# Patient Record
Sex: Female | Born: 1940 | Race: White | Hispanic: No | Marital: Single | Smoking: Former smoker
Health system: Southern US, Community
[De-identification: ages and names within clinical notes are randomized; demographics above are authoritative.]

## PROBLEM LIST (undated history)

## (undated) DIAGNOSIS — Z87442 Personal history of urinary calculi: Secondary | ICD-10-CM

## (undated) DIAGNOSIS — I639 Cerebral infarction, unspecified: Secondary | ICD-10-CM

## (undated) DIAGNOSIS — R011 Cardiac murmur, unspecified: Secondary | ICD-10-CM

## (undated) DIAGNOSIS — C801 Malignant (primary) neoplasm, unspecified: Secondary | ICD-10-CM

## (undated) DIAGNOSIS — I10 Essential (primary) hypertension: Secondary | ICD-10-CM

## (undated) DIAGNOSIS — M1990 Unspecified osteoarthritis, unspecified site: Secondary | ICD-10-CM

## (undated) DIAGNOSIS — R001 Bradycardia, unspecified: Secondary | ICD-10-CM

## (undated) DIAGNOSIS — G4730 Sleep apnea, unspecified: Secondary | ICD-10-CM

## (undated) DIAGNOSIS — I4891 Unspecified atrial fibrillation: Secondary | ICD-10-CM

## (undated) DIAGNOSIS — R7303 Prediabetes: Secondary | ICD-10-CM

## (undated) DIAGNOSIS — I480 Paroxysmal atrial fibrillation: Secondary | ICD-10-CM

## (undated) HISTORY — PX: TRIGGER FINGER RELEASE: SHX641

## (undated) HISTORY — PX: CATARACT EXTRACTION, BILATERAL: SHX1313

## (undated) HISTORY — DX: Bradycardia, unspecified: R00.1

## (undated) HISTORY — PX: TUMOR REMOVAL: SHX12

## (undated) NOTE — Telephone Encounter (Signed)
 Formatting of this note might be different from the original. Received secured message thrhello, pt gets a new order every 3 months for her replenishment supplies for the bi-pap machine.  She was just seen with Dr. Wonda on Mon., June 13, 2024.  [facility] has her records from [place]  regarding the original sleep apnea test completed in [facility] by the Cardiac Sleep Apnea provider.  She is asking about getting an order from Dr. Wonda for her supplies.  Is there someone to assist?  She's in the waiting area.ough epic:   Routing bipap supply order request to provide Electronically signed by Drenda Jansky at 06/15/2024 12:43 PM CDT

## (undated) NOTE — Telephone Encounter (Signed)
 Formatting of this note might be different from the original. Pt returns call and given update from PCP. Pt states understanding.   Nothing further needed at this time.   Electronically signed by Elodia Herter, RN at 05/18/2024  1:43 PM CDT

## (undated) NOTE — Telephone Encounter (Signed)
 Formatting of this note is different from the original. Wonda Francis BRAVO, DO to Me (Selected Message) [facility]   05/18/24  9:18 AM Note Patient can be told that her labs are all normal and that we are still waiting for the ANA cascade.    Message left for patient to return my call.  Electronically signed by Bonney Pierce, RN at 05/18/2024 12:00 PM CDT

## (undated) NOTE — Telephone Encounter (Signed)
 Formatting of this note might be different from the original. Mychart to patient with number and order status Electronically signed by Bonney Pierce, RN at 06/15/2024  1:46 PM CDT

## (undated) NOTE — Progress Notes (Signed)
 Formatting of this note might be different from the original. Patient can be told that her labs are all normal and that we are still waiting for the ANA cascade. Electronically signed by Wonda Francis BRAVO, DO at 05/18/2024  9:18 AM CDT

## (undated) NOTE — Progress Notes (Signed)
 Formatting of this note might be different from the original. Patient ANA cascade is negative.  Please call Electronically signed by Wonda Francis BRAVO, DO at 05/20/2024  8:26 AM CDT

---

## 2015-11-26 DIAGNOSIS — M25561 Pain in right knee: Secondary | ICD-10-CM | POA: Diagnosis not present

## 2015-11-26 DIAGNOSIS — S134XXD Sprain of ligaments of cervical spine, subsequent encounter: Secondary | ICD-10-CM | POA: Diagnosis not present

## 2015-11-26 DIAGNOSIS — M542 Cervicalgia: Secondary | ICD-10-CM | POA: Diagnosis not present

## 2015-11-26 DIAGNOSIS — M5030 Other cervical disc degeneration, unspecified cervical region: Secondary | ICD-10-CM | POA: Diagnosis not present

## 2015-11-28 DIAGNOSIS — M25561 Pain in right knee: Secondary | ICD-10-CM | POA: Diagnosis not present

## 2015-11-28 DIAGNOSIS — M542 Cervicalgia: Secondary | ICD-10-CM | POA: Diagnosis not present

## 2015-11-28 DIAGNOSIS — S134XXD Sprain of ligaments of cervical spine, subsequent encounter: Secondary | ICD-10-CM | POA: Diagnosis not present

## 2015-11-28 DIAGNOSIS — M5030 Other cervical disc degeneration, unspecified cervical region: Secondary | ICD-10-CM | POA: Diagnosis not present

## 2015-12-17 DIAGNOSIS — M5030 Other cervical disc degeneration, unspecified cervical region: Secondary | ICD-10-CM | POA: Diagnosis not present

## 2015-12-17 DIAGNOSIS — S134XXD Sprain of ligaments of cervical spine, subsequent encounter: Secondary | ICD-10-CM | POA: Diagnosis not present

## 2015-12-17 DIAGNOSIS — M542 Cervicalgia: Secondary | ICD-10-CM | POA: Diagnosis not present

## 2015-12-17 DIAGNOSIS — M25561 Pain in right knee: Secondary | ICD-10-CM | POA: Diagnosis not present

## 2015-12-18 DIAGNOSIS — R05 Cough: Secondary | ICD-10-CM | POA: Diagnosis not present

## 2015-12-18 DIAGNOSIS — J300 Vasomotor rhinitis: Secondary | ICD-10-CM | POA: Diagnosis not present

## 2015-12-24 DIAGNOSIS — M5030 Other cervical disc degeneration, unspecified cervical region: Secondary | ICD-10-CM | POA: Diagnosis not present

## 2015-12-24 DIAGNOSIS — S134XXD Sprain of ligaments of cervical spine, subsequent encounter: Secondary | ICD-10-CM | POA: Diagnosis not present

## 2015-12-24 DIAGNOSIS — M542 Cervicalgia: Secondary | ICD-10-CM | POA: Diagnosis not present

## 2015-12-24 DIAGNOSIS — M25561 Pain in right knee: Secondary | ICD-10-CM | POA: Diagnosis not present

## 2015-12-26 DIAGNOSIS — H25813 Combined forms of age-related cataract, bilateral: Secondary | ICD-10-CM | POA: Diagnosis not present

## 2015-12-28 DIAGNOSIS — M542 Cervicalgia: Secondary | ICD-10-CM | POA: Diagnosis not present

## 2015-12-28 DIAGNOSIS — M25561 Pain in right knee: Secondary | ICD-10-CM | POA: Diagnosis not present

## 2015-12-28 DIAGNOSIS — M5030 Other cervical disc degeneration, unspecified cervical region: Secondary | ICD-10-CM | POA: Diagnosis not present

## 2015-12-28 DIAGNOSIS — S134XXD Sprain of ligaments of cervical spine, subsequent encounter: Secondary | ICD-10-CM | POA: Diagnosis not present

## 2015-12-31 DIAGNOSIS — M25561 Pain in right knee: Secondary | ICD-10-CM | POA: Diagnosis not present

## 2015-12-31 DIAGNOSIS — M5030 Other cervical disc degeneration, unspecified cervical region: Secondary | ICD-10-CM | POA: Diagnosis not present

## 2015-12-31 DIAGNOSIS — M542 Cervicalgia: Secondary | ICD-10-CM | POA: Diagnosis not present

## 2015-12-31 DIAGNOSIS — S134XXD Sprain of ligaments of cervical spine, subsequent encounter: Secondary | ICD-10-CM | POA: Diagnosis not present

## 2016-01-02 DIAGNOSIS — M5030 Other cervical disc degeneration, unspecified cervical region: Secondary | ICD-10-CM | POA: Diagnosis not present

## 2016-01-02 DIAGNOSIS — M25561 Pain in right knee: Secondary | ICD-10-CM | POA: Diagnosis not present

## 2016-01-02 DIAGNOSIS — M542 Cervicalgia: Secondary | ICD-10-CM | POA: Diagnosis not present

## 2016-01-02 DIAGNOSIS — S134XXD Sprain of ligaments of cervical spine, subsequent encounter: Secondary | ICD-10-CM | POA: Diagnosis not present

## 2016-01-04 DIAGNOSIS — M25561 Pain in right knee: Secondary | ICD-10-CM | POA: Diagnosis not present

## 2016-01-04 DIAGNOSIS — M5030 Other cervical disc degeneration, unspecified cervical region: Secondary | ICD-10-CM | POA: Diagnosis not present

## 2016-01-04 DIAGNOSIS — S134XXD Sprain of ligaments of cervical spine, subsequent encounter: Secondary | ICD-10-CM | POA: Diagnosis not present

## 2016-01-04 DIAGNOSIS — M542 Cervicalgia: Secondary | ICD-10-CM | POA: Diagnosis not present

## 2016-02-04 DIAGNOSIS — M5030 Other cervical disc degeneration, unspecified cervical region: Secondary | ICD-10-CM | POA: Diagnosis not present

## 2016-02-04 DIAGNOSIS — M25561 Pain in right knee: Secondary | ICD-10-CM | POA: Diagnosis not present

## 2016-02-04 DIAGNOSIS — S134XXD Sprain of ligaments of cervical spine, subsequent encounter: Secondary | ICD-10-CM | POA: Diagnosis not present

## 2016-02-04 DIAGNOSIS — M542 Cervicalgia: Secondary | ICD-10-CM | POA: Diagnosis not present

## 2016-02-07 DIAGNOSIS — E041 Nontoxic single thyroid nodule: Secondary | ICD-10-CM | POA: Diagnosis not present

## 2016-02-07 DIAGNOSIS — I493 Ventricular premature depolarization: Secondary | ICD-10-CM | POA: Diagnosis not present

## 2016-02-07 DIAGNOSIS — R030 Elevated blood-pressure reading, without diagnosis of hypertension: Secondary | ICD-10-CM | POA: Diagnosis not present

## 2016-02-07 DIAGNOSIS — E038 Other specified hypothyroidism: Secondary | ICD-10-CM | POA: Diagnosis not present

## 2016-03-03 DIAGNOSIS — S90112A Contusion of left great toe without damage to nail, initial encounter: Secondary | ICD-10-CM | POA: Diagnosis not present

## 2016-03-03 DIAGNOSIS — S90111A Contusion of right great toe without damage to nail, initial encounter: Secondary | ICD-10-CM | POA: Diagnosis not present

## 2016-03-03 DIAGNOSIS — M79671 Pain in right foot: Secondary | ICD-10-CM | POA: Diagnosis not present

## 2016-03-03 DIAGNOSIS — R262 Difficulty in walking, not elsewhere classified: Secondary | ICD-10-CM | POA: Diagnosis not present

## 2016-03-03 DIAGNOSIS — L84 Corns and callosities: Secondary | ICD-10-CM | POA: Diagnosis not present

## 2016-03-03 DIAGNOSIS — M79672 Pain in left foot: Secondary | ICD-10-CM | POA: Diagnosis not present

## 2016-03-24 DIAGNOSIS — L820 Inflamed seborrheic keratosis: Secondary | ICD-10-CM | POA: Diagnosis not present

## 2016-05-15 DIAGNOSIS — I493 Ventricular premature depolarization: Secondary | ICD-10-CM | POA: Diagnosis not present

## 2016-05-15 DIAGNOSIS — E041 Nontoxic single thyroid nodule: Secondary | ICD-10-CM | POA: Diagnosis not present

## 2016-05-15 DIAGNOSIS — R030 Elevated blood-pressure reading, without diagnosis of hypertension: Secondary | ICD-10-CM | POA: Diagnosis not present

## 2016-05-15 DIAGNOSIS — E038 Other specified hypothyroidism: Secondary | ICD-10-CM | POA: Diagnosis not present

## 2016-05-30 DIAGNOSIS — N200 Calculus of kidney: Secondary | ICD-10-CM | POA: Diagnosis not present

## 2016-05-30 DIAGNOSIS — E042 Nontoxic multinodular goiter: Secondary | ICD-10-CM | POA: Diagnosis not present

## 2016-05-30 DIAGNOSIS — E559 Vitamin D deficiency, unspecified: Secondary | ICD-10-CM | POA: Diagnosis not present

## 2016-05-30 DIAGNOSIS — R946 Abnormal results of thyroid function studies: Secondary | ICD-10-CM | POA: Diagnosis not present

## 2016-06-16 DIAGNOSIS — Z8249 Family history of ischemic heart disease and other diseases of the circulatory system: Secondary | ICD-10-CM | POA: Diagnosis not present

## 2016-06-16 DIAGNOSIS — I872 Venous insufficiency (chronic) (peripheral): Secondary | ICD-10-CM | POA: Diagnosis not present

## 2016-06-16 DIAGNOSIS — I341 Nonrheumatic mitral (valve) prolapse: Secondary | ICD-10-CM | POA: Diagnosis not present

## 2016-06-16 DIAGNOSIS — I708 Atherosclerosis of other arteries: Secondary | ICD-10-CM | POA: Diagnosis not present

## 2016-06-16 DIAGNOSIS — I70219 Atherosclerosis of native arteries of extremities with intermittent claudication, unspecified extremity: Secondary | ICD-10-CM | POA: Diagnosis not present

## 2016-06-16 DIAGNOSIS — R001 Bradycardia, unspecified: Secondary | ICD-10-CM | POA: Diagnosis not present

## 2016-09-10 DIAGNOSIS — R001 Bradycardia, unspecified: Secondary | ICD-10-CM | POA: Diagnosis not present

## 2016-09-10 DIAGNOSIS — I340 Nonrheumatic mitral (valve) insufficiency: Secondary | ICD-10-CM | POA: Diagnosis not present

## 2016-09-11 DIAGNOSIS — Z23 Encounter for immunization: Secondary | ICD-10-CM | POA: Diagnosis not present

## 2016-12-03 DIAGNOSIS — H25013 Cortical age-related cataract, bilateral: Secondary | ICD-10-CM | POA: Diagnosis not present

## 2016-12-03 DIAGNOSIS — H2513 Age-related nuclear cataract, bilateral: Secondary | ICD-10-CM | POA: Diagnosis not present

## 2016-12-04 DIAGNOSIS — E041 Nontoxic single thyroid nodule: Secondary | ICD-10-CM | POA: Diagnosis not present

## 2016-12-04 DIAGNOSIS — M8588 Other specified disorders of bone density and structure, other site: Secondary | ICD-10-CM | POA: Diagnosis not present

## 2017-01-21 DIAGNOSIS — H2513 Age-related nuclear cataract, bilateral: Secondary | ICD-10-CM | POA: Diagnosis not present

## 2017-01-21 DIAGNOSIS — H02839 Dermatochalasis of unspecified eye, unspecified eyelid: Secondary | ICD-10-CM | POA: Diagnosis not present

## 2017-01-21 DIAGNOSIS — H2511 Age-related nuclear cataract, right eye: Secondary | ICD-10-CM | POA: Diagnosis not present

## 2017-01-21 DIAGNOSIS — H18413 Arcus senilis, bilateral: Secondary | ICD-10-CM | POA: Diagnosis not present

## 2017-01-21 DIAGNOSIS — H25043 Posterior subcapsular polar age-related cataract, bilateral: Secondary | ICD-10-CM | POA: Diagnosis not present

## 2017-03-03 DIAGNOSIS — H2511 Age-related nuclear cataract, right eye: Secondary | ICD-10-CM | POA: Diagnosis not present

## 2017-03-03 DIAGNOSIS — H25011 Cortical age-related cataract, right eye: Secondary | ICD-10-CM | POA: Diagnosis not present

## 2017-03-10 DIAGNOSIS — H25011 Cortical age-related cataract, right eye: Secondary | ICD-10-CM | POA: Diagnosis not present

## 2017-03-10 DIAGNOSIS — H2511 Age-related nuclear cataract, right eye: Secondary | ICD-10-CM | POA: Diagnosis not present

## 2017-03-20 DIAGNOSIS — Z9841 Cataract extraction status, right eye: Secondary | ICD-10-CM | POA: Diagnosis not present

## 2017-03-31 DIAGNOSIS — H2512 Age-related nuclear cataract, left eye: Secondary | ICD-10-CM | POA: Diagnosis not present

## 2017-03-31 DIAGNOSIS — H25012 Cortical age-related cataract, left eye: Secondary | ICD-10-CM | POA: Diagnosis not present

## 2017-04-07 DIAGNOSIS — H25012 Cortical age-related cataract, left eye: Secondary | ICD-10-CM | POA: Diagnosis not present

## 2017-04-07 DIAGNOSIS — H2512 Age-related nuclear cataract, left eye: Secondary | ICD-10-CM | POA: Diagnosis not present

## 2017-06-17 ENCOUNTER — Other Ambulatory Visit: Payer: Self-pay | Admitting: Internal Medicine

## 2017-06-17 DIAGNOSIS — M859 Disorder of bone density and structure, unspecified: Secondary | ICD-10-CM | POA: Diagnosis not present

## 2017-06-17 DIAGNOSIS — R3121 Asymptomatic microscopic hematuria: Secondary | ICD-10-CM | POA: Diagnosis not present

## 2017-06-17 DIAGNOSIS — R001 Bradycardia, unspecified: Secondary | ICD-10-CM | POA: Diagnosis not present

## 2017-06-17 DIAGNOSIS — I493 Ventricular premature depolarization: Secondary | ICD-10-CM | POA: Diagnosis not present

## 2017-06-17 DIAGNOSIS — M4302 Spondylolysis, cervical region: Secondary | ICD-10-CM | POA: Diagnosis not present

## 2017-06-17 DIAGNOSIS — Z1389 Encounter for screening for other disorder: Secondary | ICD-10-CM | POA: Diagnosis not present

## 2017-06-17 DIAGNOSIS — E041 Nontoxic single thyroid nodule: Secondary | ICD-10-CM

## 2017-06-17 DIAGNOSIS — Z6826 Body mass index (BMI) 26.0-26.9, adult: Secondary | ICD-10-CM | POA: Diagnosis not present

## 2017-06-17 DIAGNOSIS — J45998 Other asthma: Secondary | ICD-10-CM | POA: Diagnosis not present

## 2017-06-17 DIAGNOSIS — Z0000 Encounter for general adult medical examination without abnormal findings: Secondary | ICD-10-CM | POA: Diagnosis not present

## 2017-06-17 DIAGNOSIS — Z1231 Encounter for screening mammogram for malignant neoplasm of breast: Secondary | ICD-10-CM | POA: Diagnosis not present

## 2017-06-17 DIAGNOSIS — J3089 Other allergic rhinitis: Secondary | ICD-10-CM | POA: Diagnosis not present

## 2017-06-22 ENCOUNTER — Ambulatory Visit
Admission: RE | Admit: 2017-06-22 | Discharge: 2017-06-22 | Disposition: A | Discharge status: Home / Self Care | Payer: Medicare Other | Source: Ambulatory Visit | Attending: Internal Medicine | Admitting: Internal Medicine

## 2017-06-22 DIAGNOSIS — E041 Nontoxic single thyroid nodule: Secondary | ICD-10-CM

## 2017-06-22 DIAGNOSIS — E042 Nontoxic multinodular goiter: Secondary | ICD-10-CM | POA: Diagnosis not present

## 2017-06-24 DIAGNOSIS — Z6827 Body mass index (BMI) 27.0-27.9, adult: Secondary | ICD-10-CM | POA: Diagnosis not present

## 2017-06-24 DIAGNOSIS — B029 Zoster without complications: Secondary | ICD-10-CM | POA: Diagnosis not present

## 2017-07-28 ENCOUNTER — Other Ambulatory Visit: Payer: Self-pay | Admitting: Internal Medicine

## 2017-07-28 DIAGNOSIS — Z1231 Encounter for screening mammogram for malignant neoplasm of breast: Secondary | ICD-10-CM

## 2017-08-13 DIAGNOSIS — L821 Other seborrheic keratosis: Secondary | ICD-10-CM | POA: Diagnosis not present

## 2017-08-13 DIAGNOSIS — L720 Epidermal cyst: Secondary | ICD-10-CM | POA: Diagnosis not present

## 2017-08-13 DIAGNOSIS — L570 Actinic keratosis: Secondary | ICD-10-CM | POA: Diagnosis not present

## 2017-08-14 DIAGNOSIS — Z23 Encounter for immunization: Secondary | ICD-10-CM | POA: Diagnosis not present

## 2017-08-18 DIAGNOSIS — M859 Disorder of bone density and structure, unspecified: Secondary | ICD-10-CM | POA: Diagnosis not present

## 2017-08-21 ENCOUNTER — Encounter: Payer: Self-pay | Admitting: Radiology

## 2017-08-21 ENCOUNTER — Ambulatory Visit
Admission: RE | Admit: 2017-08-21 | Discharge: 2017-08-21 | Disposition: A | Discharge status: Home / Self Care | Payer: Medicare Other | Source: Ambulatory Visit | Attending: Internal Medicine | Admitting: Internal Medicine

## 2017-08-21 DIAGNOSIS — Z1231 Encounter for screening mammogram for malignant neoplasm of breast: Secondary | ICD-10-CM | POA: Diagnosis not present

## 2017-09-17 ENCOUNTER — Encounter: Payer: Self-pay | Admitting: Internal Medicine

## 2017-09-17 ENCOUNTER — Ambulatory Visit (INDEPENDENT_AMBULATORY_CARE_PROVIDER_SITE_OTHER): Payer: Medicare Other | Admitting: Internal Medicine

## 2017-09-17 VITALS — BP 124/72 | HR 70 | Temp 98.0°F | Ht 66.0 in | Wt 167.0 lb

## 2017-09-17 DIAGNOSIS — E042 Nontoxic multinodular goiter: Secondary | ICD-10-CM | POA: Diagnosis not present

## 2017-09-17 DIAGNOSIS — R5383 Other fatigue: Secondary | ICD-10-CM | POA: Diagnosis not present

## 2017-09-17 LAB — T4, FREE: Free T4: 1.06 ng/dL (ref 0.60–1.60)

## 2017-09-17 LAB — TSH: TSH: 0.53 u[IU]/mL (ref 0.35–4.50)

## 2017-09-17 LAB — T3, FREE: T3 FREE: 3.4 pg/mL (ref 2.3–4.2)

## 2017-09-17 NOTE — Progress Notes (Signed)
Patient ID: Kristin Lowe, female   DOB: 1941/04/06, 76 y.o.   MRN: [id number]    HPI  Kristin Lowe is a 76 y.o.-year-old female, referred by her PCP, Dr. Joylene Draft, for evaluation for multiple thyroid nodules. However, she also mentions has increased fatigue. She moved here in 2017 from [place].  She was found to have thyroid nodules in 2007 - followed by endocrinology in [facility].  She was told her nodules are now shrinking.  She had several Bx's >> benign, last in 2016  Thyroid U/S (06/22/2017): 3 small nodules, isoechoic, solid. No follow-up was recommended.  Pt denies: - feeling nodules in neck - dysphagia - choking - SOB with lying down  I reviewed pt's thyroid tests: 06/17/2017: TSH 0.57 09/04/2015: TSH 0.34 04/12/2014: TSH 0.68 No results found for: TSH, FREET4   Pt c/o: - + fatigue - + hair loss - + insomnia - wakes up at 3 am - + heat intolerance - no tremors - no palpitations - + stiff muscles - + anxiety/+ depression -dog died - no hyperdefecation/+ constipation - + weight gain - + irritability She is very active and also exercising: biking.   No FH of thyroid ds.: mother, sister. No FH of thyroid cancer. No h/o radiation tx to head or neck.  No seaweed or kelp. No recent contrast studies. No steroid use. No herbal supplements. No Biotin supplements or Hair, Skin and Nails vitamins.  She is on vitamin D - recently  increased to 2000 units a day (increased 3 weeks). She also was told to increase calcium to 500 mg 2x day.We reviewed together her previous labs from PCP, latest from 06/17/2017, when her vitamin D was 62.8 and a calcium level was slightly high, at 10.6 (7.0-10.5). She was told to increase the vitamin D and calcium doses afterwards as a DEXA scan return on 08/18/2017 indicating osteopenia.  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 Calcium accumulation in arthritic sites can confound the results of  the bone density scan)  - LFN: -1.0  - RFN: -1.9  - FRAX MOF 12%, hip fracture risk 2.7%. These are both under the thresholds for treatment, of 20% and 3% respectively.   We also reviewed the rest of the labs from Dr. Joylene Draft:  06/17/2017:  Normal urinalysis, except 10-20 red blood cells  Lipid panel: 194/143/66/99 Normal CBC with slightly low RDW Normal LFTs Normal CMP except calcium slightly high as mentioned above (patient was taking 500 mg calcium daily at that time)   Of note, on 08/02/2015, the PTH was 63, calcium 9.9, and vitamin D 51 per review of records from previous doctor  ROS: Constitutional: + see HPI, + poor sleep Eyes: + blurry vision, + xerophthalmia ENT: no sore throat, no nodules palpated in throat, no dysphagia/odynophagia, + hoarseness (has allergies) Cardiovascular: no CP/SOB/palpitations/+ leg swelling Respiratory: no cough/SOB Gastrointestinal: no N/V/D/+ C Musculoskeletal:+ muscle/+ joint aches Skin: no rashes, + hair loss Neurological: no tremors/numbness/tingling/dizziness Psychiatric: no depression/+ anxiety  Past Medical History:  Diagnosis Date  . Bradycardia    Past Surgical History:  Procedure Laterality Date  . CATARACT EXTRACTION, BILATERAL    . TRIGGER FINGER RELEASE    . TUMOR REMOVAL     Social History   Social History  . Marital status: single    Spouse name: N/A  . Number of children: 1   Occupational History  . retired   Social History Main Topics  . Smoking  status: Former Research scientist (life sciences)  . Smokeless tobacco: Never Used  . Alcohol use Wine, 4x a week  . Drug use: no   No current outpatient prescriptions on file.   No current facility-administered medications for this visit.    No Known Allergies Family History  Problem Relation Age of Onset  . Breast cancer Mother   . Diabetes Mother   . Thyroid disease Mother   . Heart disease Father   . Hypertension Sister   . Thyroid disease Sister   . Diabetes Brother   .  Hypertension Brother    PE: BP 124/72   Pulse 70   Temp 98 F (36.7 C) (Oral)   Ht 5\' 6"  (1.676 m)   Wt 167 lb (75.8 kg)   SpO2 99%   BMI 26.95 kg/m  Wt Readings from Last 3 Encounters:  09/17/17 167 lb (75.8 kg)   Constitutional: slightly overweight, in NAD Eyes: PERRLA, EOMI, no exophthalmos ENT: moist mucous membranes, no thyromegaly, no cervical lymphadenopathy Cardiovascular: RRR, No MRG Respiratory: CTA B Gastrointestinal: abdomen soft, NT, ND, BS+ Musculoskeletal: no deformities, strength intact in all 4;  Skin: moist, warm, no rashes Neurological: no tremor with outstretched hands, DTR normal in all 4  ASSESSMENT: 1. Multiple thyroid nodules  2. Fatigue  PLAN: 1. Multiple thyroid nodules - I reviewed the images of her  latest thyroid ultrasound and the dominant nodules are not very large  and they do not present concerning features: They are isoechoic, without microcalcification, more wide than tall and well delimited from surrounding tissue. She had extensive imaging for these nodules and also history of biopsies. At this point, I recommended and patient agrees, not to pursue them any further unless she starts developing neck compression symptoms. She never had any neck discomfort from the nodules.   Pt does not have a thyroid cancer family history or a personal history of RxTx to head/neck. All these would favor benignity.   2. Fatigue - Patient has a constellation of general, nonspecific symptoms and we reviewed her latest labs from 05/2017 and also her previous labs from 2016 and they do not show any concerning abnormality. We discussed to still repeat her TFTs since she is complaining of   symptoms that could be attributed to hyper and hypothyroidism, but otherwise, I suggested to continue with follow with Dr. Joylene Draft and I reassured the patient that her symptoms are not likely endocrine related. She may need to have a B12 vitamin checked at follow-up if this has not  been on before. - We discussed about her calcium or vitamin D supplements and she is telling me that the doses were increased after her bone density scan due to the osteopenia diagnosis. However, her vitamin D was well into the normal range at last check in 05/2017 and her calcium was slightly high. I advised her to discuss with her PCP to possibly back off both doses. She has an appointment coming up with him in January. - Regarding her weight gain, I recommended a more plant-based diet and given references - We will schedule another appointment if the TFTs return abnormal   Component     Latest Ref Rng & Units 09/17/2017  TSH     0.35 - 4.50 uIU/mL 0.53  T4,Free(Direct)     0.60 - 1.60 ng/dL 1.06  Triiodothyronine,Free,Serum     2.3 - 4.2 pg/mL 3.4  TFTs are normal.  Philemon Kingdom, MD PhD [facility]

## 2017-09-17 NOTE — Patient Instructions (Addendum)
Please stop at the lab.  Please look up "The Engine 2 diet" by Christa See.  Please let me know if you develop neck symptoms.  Please discuss with Dr Joylene Draft about reducing calcium and vitamin D.   Return to see me as needed.

## 2017-09-18 ENCOUNTER — Telehealth: Payer: Self-pay

## 2017-09-18 NOTE — Telephone Encounter (Signed)
LVM, gave lab results. Gave call back number if any questions or concerns.  

## 2017-09-18 NOTE — Telephone Encounter (Signed)
-----   Message from Philemon Kingdom, MD sent at 09/17/2017  5:35 PM EDT ----- Almyra Free, can you please call pt: TFTs are all normal.

## 2017-12-08 DIAGNOSIS — F4321 Adjustment disorder with depressed mood: Secondary | ICD-10-CM | POA: Diagnosis not present

## 2017-12-09 DIAGNOSIS — M238X2 Other internal derangements of left knee: Secondary | ICD-10-CM | POA: Diagnosis not present

## 2017-12-09 DIAGNOSIS — M25562 Pain in left knee: Secondary | ICD-10-CM | POA: Diagnosis not present

## 2017-12-16 DIAGNOSIS — M25562 Pain in left knee: Secondary | ICD-10-CM | POA: Diagnosis not present

## 2017-12-17 DIAGNOSIS — J45998 Other asthma: Secondary | ICD-10-CM | POA: Diagnosis not present

## 2017-12-18 DIAGNOSIS — S83242A Other tear of medial meniscus, current injury, left knee, initial encounter: Secondary | ICD-10-CM | POA: Diagnosis not present

## 2017-12-18 DIAGNOSIS — M1712 Unilateral primary osteoarthritis, left knee: Secondary | ICD-10-CM | POA: Diagnosis not present

## 2017-12-22 DIAGNOSIS — F4321 Adjustment disorder with depressed mood: Secondary | ICD-10-CM | POA: Diagnosis not present

## 2017-12-22 DIAGNOSIS — H04123 Dry eye syndrome of bilateral lacrimal glands: Secondary | ICD-10-CM | POA: Diagnosis not present

## 2017-12-28 DIAGNOSIS — F4321 Adjustment disorder with depressed mood: Secondary | ICD-10-CM | POA: Diagnosis not present

## 2017-12-30 DIAGNOSIS — M25562 Pain in left knee: Secondary | ICD-10-CM | POA: Diagnosis not present

## 2018-01-04 DIAGNOSIS — M25562 Pain in left knee: Secondary | ICD-10-CM | POA: Diagnosis not present

## 2018-01-07 DIAGNOSIS — M25562 Pain in left knee: Secondary | ICD-10-CM | POA: Diagnosis not present

## 2018-01-11 DIAGNOSIS — F4321 Adjustment disorder with depressed mood: Secondary | ICD-10-CM | POA: Diagnosis not present

## 2018-01-12 DIAGNOSIS — M25562 Pain in left knee: Secondary | ICD-10-CM | POA: Diagnosis not present

## 2018-01-14 DIAGNOSIS — M25562 Pain in left knee: Secondary | ICD-10-CM | POA: Diagnosis not present

## 2018-01-19 DIAGNOSIS — M25562 Pain in left knee: Secondary | ICD-10-CM | POA: Diagnosis not present

## 2018-01-20 DIAGNOSIS — S83242A Other tear of medial meniscus, current injury, left knee, initial encounter: Secondary | ICD-10-CM | POA: Diagnosis not present

## 2018-01-20 DIAGNOSIS — M1712 Unilateral primary osteoarthritis, left knee: Secondary | ICD-10-CM | POA: Diagnosis not present

## 2018-01-21 DIAGNOSIS — M25562 Pain in left knee: Secondary | ICD-10-CM | POA: Diagnosis not present

## 2018-01-26 DIAGNOSIS — M25562 Pain in left knee: Secondary | ICD-10-CM | POA: Diagnosis not present

## 2018-02-01 DIAGNOSIS — F4321 Adjustment disorder with depressed mood: Secondary | ICD-10-CM | POA: Diagnosis not present

## 2018-02-05 DIAGNOSIS — M25562 Pain in left knee: Secondary | ICD-10-CM | POA: Diagnosis not present

## 2018-02-15 DIAGNOSIS — F4321 Adjustment disorder with depressed mood: Secondary | ICD-10-CM | POA: Diagnosis not present

## 2018-02-22 DIAGNOSIS — L820 Inflamed seborrheic keratosis: Secondary | ICD-10-CM | POA: Diagnosis not present

## 2018-02-22 DIAGNOSIS — L821 Other seborrheic keratosis: Secondary | ICD-10-CM | POA: Diagnosis not present

## 2018-03-15 DIAGNOSIS — F4321 Adjustment disorder with depressed mood: Secondary | ICD-10-CM | POA: Diagnosis not present

## 2018-05-11 DIAGNOSIS — Z124 Encounter for screening for malignant neoplasm of cervix: Secondary | ICD-10-CM | POA: Diagnosis not present

## 2018-05-11 DIAGNOSIS — Z1151 Encounter for screening for human papillomavirus (HPV): Secondary | ICD-10-CM | POA: Diagnosis not present

## 2018-05-11 DIAGNOSIS — L292 Pruritus vulvae: Secondary | ICD-10-CM | POA: Diagnosis not present

## 2018-05-11 DIAGNOSIS — Z01419 Encounter for gynecological examination (general) (routine) without abnormal findings: Secondary | ICD-10-CM | POA: Diagnosis not present

## 2018-05-15 ENCOUNTER — Encounter (HOSPITAL_COMMUNITY): Payer: Self-pay | Admitting: Emergency Medicine

## 2018-05-15 ENCOUNTER — Other Ambulatory Visit: Payer: Self-pay

## 2018-05-15 ENCOUNTER — Emergency Department (HOSPITAL_COMMUNITY): Payer: Medicare Other

## 2018-05-15 ENCOUNTER — Observation Stay (HOSPITAL_COMMUNITY)
Admission: EM | Admit: 2018-05-15 | Discharge: 2018-05-17 | Disposition: A | Discharge status: Home / Self Care | Payer: Medicare Other | Attending: Family Medicine | Admitting: Family Medicine

## 2018-05-15 DIAGNOSIS — I10 Essential (primary) hypertension: Secondary | ICD-10-CM

## 2018-05-15 DIAGNOSIS — M6281 Muscle weakness (generalized): Secondary | ICD-10-CM | POA: Insufficient documentation

## 2018-05-15 DIAGNOSIS — R739 Hyperglycemia, unspecified: Secondary | ICD-10-CM

## 2018-05-15 DIAGNOSIS — I639 Cerebral infarction, unspecified: Principal | ICD-10-CM | POA: Insufficient documentation

## 2018-05-15 DIAGNOSIS — G459 Transient cerebral ischemic attack, unspecified: Secondary | ICD-10-CM | POA: Diagnosis not present

## 2018-05-15 DIAGNOSIS — R4781 Slurred speech: Secondary | ICD-10-CM | POA: Diagnosis present

## 2018-05-15 DIAGNOSIS — R51 Headache: Secondary | ICD-10-CM | POA: Diagnosis not present

## 2018-05-15 DIAGNOSIS — Z87891 Personal history of nicotine dependence: Secondary | ICD-10-CM | POA: Insufficient documentation

## 2018-05-15 DIAGNOSIS — R001 Bradycardia, unspecified: Secondary | ICD-10-CM | POA: Diagnosis not present

## 2018-05-15 HISTORY — DX: Essential (primary) hypertension: I10

## 2018-05-15 LAB — DIFFERENTIAL
Abs Immature Granulocytes: 0 10*3/uL (ref 0.0–0.1)
BASOS PCT: 1 %
Basophils Absolute: 0.1 10*3/uL (ref 0.0–0.1)
EOS PCT: 2 %
Eosinophils Absolute: 0.2 10*3/uL (ref 0.0–0.7)
Immature Granulocytes: 0 %
Lymphocytes Relative: 22 %
Lymphs Abs: 1.6 10*3/uL (ref 0.7–4.0)
MONO ABS: 0.4 10*3/uL (ref 0.1–1.0)
Monocytes Relative: 6 %
NEUTROS PCT: 69 %
Neutro Abs: 4.8 10*3/uL (ref 1.7–7.7)

## 2018-05-15 LAB — LIPID PANEL
CHOLESTEROL: 182 mg/dL (ref 0–200)
HDL: 53 mg/dL (ref >40)
LDL CALC: 106 mg/dL — AB (ref 0–99)
TRIGLYCERIDES: 115 mg/dL (ref <150)
Total CHOL/HDL Ratio: 3.4 RATIO
VLDL: 23 mg/dL (ref 0–40)

## 2018-05-15 LAB — I-STAT CHEM 8, ED
BUN: 13 mg/dL (ref 6–20)
CALCIUM ION: 1.3 mmol/L (ref 1.15–1.40)
Chloride: 104 mmol/L (ref 101–111)
Creatinine, Ser: 0.8 mg/dL (ref 0.44–1.00)
Glucose, Bld: 148 mg/dL — ABNORMAL HIGH (ref 65–99)
HCT: 39 % (ref 36.0–46.0)
HEMOGLOBIN: 13.3 g/dL (ref 12.0–15.0)
Potassium: 3.6 mmol/L (ref 3.5–5.1)
SODIUM: 141 mmol/L (ref 135–145)
TCO2: 24 mmol/L (ref 22–32)

## 2018-05-15 LAB — COMPREHENSIVE METABOLIC PANEL
ALK PHOS: 65 U/L (ref 38–126)
ALT: 25 U/L (ref 14–54)
ANION GAP: 6 (ref 5–15)
AST: 28 U/L (ref 15–41)
Albumin: 4.2 g/dL (ref 3.5–5.0)
BUN: 11 mg/dL (ref 6–20)
CALCIUM: 10.3 mg/dL (ref 8.9–10.3)
CHLORIDE: 107 mmol/L (ref 101–111)
CO2: 27 mmol/L (ref 22–32)
Creatinine, Ser: 0.76 mg/dL (ref 0.44–1.00)
GFR calc non Af Amer: >60 mL/min (ref >60)
Glucose, Bld: 154 mg/dL — ABNORMAL HIGH (ref 65–99)
Potassium: 3.7 mmol/L (ref 3.5–5.1)
Sodium: 140 mmol/L (ref 135–145)
Total Bilirubin: 0.4 mg/dL (ref 0.3–1.2)
Total Protein: 7.2 g/dL (ref 6.5–8.1)

## 2018-05-15 LAB — CBC
HCT: 41.8 % (ref 36.0–46.0)
Hemoglobin: 13.1 g/dL (ref 12.0–15.0)
MCH: 29.6 pg (ref 26.0–34.0)
MCHC: 31.3 g/dL (ref 30.0–36.0)
MCV: 94.4 fL (ref 78.0–100.0)
PLATELETS: 276 10*3/uL (ref 150–400)
RBC: 4.43 MIL/uL (ref 3.87–5.11)
RDW: 12.3 % (ref 11.5–15.5)
WBC: 7 10*3/uL (ref 4.0–10.5)

## 2018-05-15 LAB — PROTIME-INR
INR: 1.02
Prothrombin Time: 13.3 seconds (ref 11.4–15.2)

## 2018-05-15 LAB — I-STAT TROPONIN, ED: Troponin i, poc: 0.01 ng/mL (ref 0.00–0.08)

## 2018-05-15 LAB — HEMOGLOBIN A1C
Hgb A1c MFr Bld: 5.5 % (ref 4.8–5.6)
Mean Plasma Glucose: 111.15 mg/dL

## 2018-05-15 LAB — APTT: APTT: 28 s (ref 24–36)

## 2018-05-15 MED ORDER — ENOXAPARIN SODIUM 40 MG/0.4ML ~~LOC~~ SOLN
40.0000 mg | SUBCUTANEOUS | Status: DC
  Administered 2018-05-15 – 2018-05-16 (×2): 40 mg via SUBCUTANEOUS
  Filled 2018-05-15 (×2): qty 0.4

## 2018-05-15 MED ORDER — INSULIN ASPART 100 UNIT/ML ~~LOC~~ SOLN: 0.0000 [IU] | Freq: Three times a day (TID) | SUBCUTANEOUS | Status: DC

## 2018-05-15 MED ORDER — ACETAMINOPHEN 325 MG PO TABS: 650.0000 mg | ORAL_TABLET | Freq: Four times a day (QID) | ORAL | Status: DC | PRN

## 2018-05-15 MED ORDER — ATORVASTATIN CALCIUM 40 MG PO TABS
40.0000 mg | ORAL_TABLET | Freq: Every day | ORAL | Status: DC
  Administered 2018-05-16 – 2018-05-17 (×2): 40 mg via ORAL
  Filled 2018-05-15: qty 2
  Filled 2018-05-15: qty 1
  Filled 2018-05-15 (×2): qty 4
  Filled 2018-05-15 (×2): qty 2

## 2018-05-15 MED ORDER — ASPIRIN EC 81 MG PO TBEC
81.0000 mg | DELAYED_RELEASE_TABLET | Freq: Every day | ORAL | Status: DC
  Administered 2018-05-16: 81 mg via ORAL
  Filled 2018-05-15: qty 1

## 2018-05-15 MED ORDER — ONDANSETRON HCL 4 MG/2ML IJ SOLN: 4.0000 mg | Freq: Four times a day (QID) | INTRAMUSCULAR | Status: DC | PRN

## 2018-05-15 NOTE — ED Notes (Signed)
Pt in CT.

## 2018-05-15 NOTE — ED Triage Notes (Signed)
Pt c/o dull headache above left eye that she noticed when she woke up this morning, states that at noon she was talking to her neighbor and noticed that she was mixing up her words. No neuro deficits at this time, no weakness, A&O x 4.
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
## 2018-05-15 NOTE — ED Provider Notes (Signed)
[place] EMERGENCY DEPARTMENT Provider Note   CSN: [id number] Arrival date & time: 05/15/18  1626     History   Chief Complaint Chief Complaint  Patient presents with  . Headache    HPI Kristin Lowe is a 77 y.o. female.  HPI   Patient is a 77 year old female presenting with intermittent neurologic complaints.  Patient reports that this morning she noticed that she was unable to pronounce her words.  She was talking to neighbors and she was slurring her speech having trouble difficulty speaking.  She did not notice any arm or leg weakness.  She reports by the time that she called her doctor, and resolved.  This was roughly 3 hours later.  She reports she feels back to normal now.  She does not have significant past medical history, She reports, this is her first time being in the hospital since she was born.  Past Medical History:  Diagnosis Date  . Bradycardia   . Hypertension     Patient Active Problem List   Diagnosis Date Noted  . TIA (transient ischemic attack) 05/15/2018  . Multiple thyroid nodules 09/17/2017  . Other fatigue 09/17/2017    Past Surgical History:  Procedure Laterality Date  . CATARACT EXTRACTION, BILATERAL    . TRIGGER FINGER RELEASE    . TUMOR REMOVAL       OB History   None      Home Medications    Prior to Admission medications   Not on File    Family History Family History  Problem Relation Age of Onset  . Breast cancer Mother   . Diabetes Mother   . Thyroid disease Mother   . Heart disease Father   . Hypertension Sister   . Thyroid disease Sister   . Diabetes Brother   . Hypertension Brother     Social History Social History   Tobacco Use  . Smoking status: Former Research scientist (life sciences)  . Smokeless tobacco: Never Used  Substance Use Topics  . Alcohol use: No  . Drug use: Not on file     Allergies   Patient has no known allergies.   Review of Systems Review of Systems  Constitutional: Negative for  activity change and fatigue.  HENT: Negative for congestion and drooling.   Eyes: Negative for discharge.  Respiratory: Negative for cough and chest tightness.   Cardiovascular: Negative for chest pain.  Gastrointestinal: Negative for abdominal distention.  Genitourinary: Negative for difficulty urinating and dysuria.  Musculoskeletal: Negative for joint swelling.  Skin: Negative for rash.  Allergic/Immunologic: Negative for immunocompromised state.  Neurological: Positive for speech difficulty. Negative for dizziness, seizures and weakness.  Psychiatric/Behavioral: Negative for agitation and behavioral problems.  All other systems reviewed and are negative.    Physical Exam Updated Vital Signs BP (!) 151/70   Pulse (!) 58   Temp 98.2 F (36.8 C) (Oral)   Resp 17   SpO2 99%   Physical Exam  Constitutional: She is oriented to person, place, and time. She appears well-developed and well-nourished.  HENT:  Head: Normocephalic and atraumatic.  Eyes: Pupils are equal, round, and reactive to light. EOM are normal. Right eye exhibits no discharge.  Cardiovascular: Normal rate.  Pulmonary/Chest: Effort normal. No respiratory distress.  Abdominal: Soft. Bowel sounds are normal. She exhibits no distension. There is no tenderness.  Neurological: She is oriented to person, place, and time. She has normal strength. She displays a negative Romberg sign. Coordination normal. GCS eye subscore is  4. GCS verbal subscore is 5. GCS motor subscore is 6.  Equal strength bilaterally upper and lower extremities negative pronator drift. Normal sensation bilaterally. Speech comprehensible, no slurring. Facial nerve tested and appears grossly normal. Alert and oriented 3.   Skin: Skin is warm and dry. She is not diaphoretic.  Psychiatric: She has a normal mood and affect.  Nursing note and vitals reviewed.    ED Treatments / Results  Labs (all labs ordered are listed, but only abnormal results are  displayed) Labs Reviewed  COMPREHENSIVE METABOLIC PANEL - Abnormal; Notable for the following components:      Result Value   Glucose, Bld 154 (*)    All other components within normal limits  I-STAT CHEM 8, ED - Abnormal; Notable for the following components:   Glucose, Bld 148 (*)    All other components within normal limits  PROTIME-INR  APTT  CBC  DIFFERENTIAL  LIPID PANEL  HEMOGLOBIN A1C  CBC  BASIC METABOLIC PANEL  I-STAT TROPONIN, ED  CBG MONITORING, ED    EKG None  Radiology Ct Head Wo Contrast  Result Date: 05/15/2018 CLINICAL DATA:  Headache EXAM: CT HEAD WITHOUT CONTRAST TECHNIQUE: Contiguous axial images were obtained from the base of the skull through the vertex without intravenous contrast. COMPARISON:  None. FINDINGS: Brain: No acute territorial infarction, intracranial hemorrhage, or mass lesion is visualized. The ventricles are nonenlarged. Left sub insular hypodensity. Vascular: No hyperdense vessels.  Carotid vascular calcification. Skull: Normal. Negative for fracture or focal lesion. Sinuses/Orbits: Mucosal thickening in the ethmoid and maxillary sinuses. Other: None IMPRESSION: 1. Negative for large vessel territorial infarct, hemorrhage or mass. 2. Small focal hypodensity in the left sub insular region, possible age indeterminate lacunar infarct. Electronically Signed   By: Donavan Foil M.D.   On: 05/15/2018 18:41    Procedures Procedures (including critical care time)  Medications Ordered in ED Medications  aspirin EC tablet 81 mg (has no administration in time range)  insulin aspart (novoLOG) injection 0-9 Units (has no administration in time range)  acetaminophen (TYLENOL) tablet 650 mg (has no administration in time range)  ondansetron (ZOFRAN) injection 4 mg (has no administration in time range)  enoxaparin (LOVENOX) injection 40 mg (has no administration in time range)     Initial Impression / Assessment and Plan / ED Course  I have reviewed  the triage vital signs and the nursing notes.  Pertinent labs & imaging results that were available during my care of the patient were reviewed by me and considered in my medical decision making (see chart for details).      Patient is a 77 year old female presenting with intermittent neurologic complaints.  Patient reports that this morning she noticed that she was unable to pronounce her words.  She was talking to neighbors and she was slurring her speech having trouble difficulty speaking.  She did not notice any arm or leg weakness.  She reports by the time that she called her doctor, and resolved.  This was roughly 3 hours later.  She reports she feels back to normal now.  She does not have significant past medical history, She reports, this is her first time being in the hospital since she was born.  7:13 PM Concerning for TIA.  CT shows questionable subacute infarct.  Will admit for stroke work-up.   Discussed with Dr. Cheral Marker from neruology, who is in agreement.   Final Clinical Impressions(s) / ED Diagnoses   Final diagnoses:  None  ED Discharge Orders    None       Macarthur Critchley, MD 05/15/18 2030

## 2018-05-15 NOTE — ED Notes (Signed)
Attempted report x1. 

## 2018-05-16 ENCOUNTER — Observation Stay (HOSPITAL_COMMUNITY): Payer: Medicare Other

## 2018-05-16 ENCOUNTER — Encounter (HOSPITAL_COMMUNITY): Payer: Medicare Other

## 2018-05-16 DIAGNOSIS — I10 Essential (primary) hypertension: Secondary | ICD-10-CM | POA: Diagnosis not present

## 2018-05-16 DIAGNOSIS — I639 Cerebral infarction, unspecified: Secondary | ICD-10-CM | POA: Diagnosis not present

## 2018-05-16 DIAGNOSIS — Z87891 Personal history of nicotine dependence: Secondary | ICD-10-CM | POA: Diagnosis not present

## 2018-05-16 DIAGNOSIS — G459 Transient cerebral ischemic attack, unspecified: Secondary | ICD-10-CM | POA: Diagnosis not present

## 2018-05-16 DIAGNOSIS — M6281 Muscle weakness (generalized): Secondary | ICD-10-CM | POA: Diagnosis not present

## 2018-05-16 DIAGNOSIS — R4781 Slurred speech: Secondary | ICD-10-CM | POA: Diagnosis not present

## 2018-05-16 LAB — CBC
HEMATOCRIT: 42 % (ref 36.0–46.0)
Hemoglobin: 13.3 g/dL (ref 12.0–15.0)
MCH: 29.6 pg (ref 26.0–34.0)
MCHC: 31.7 g/dL (ref 30.0–36.0)
MCV: 93.5 fL (ref 78.0–100.0)
PLATELETS: 288 10*3/uL (ref 150–400)
RBC: 4.49 MIL/uL (ref 3.87–5.11)
RDW: 12.4 % (ref 11.5–15.5)
WBC: 5.7 10*3/uL (ref 4.0–10.5)

## 2018-05-16 LAB — GLUCOSE, CAPILLARY
GLUCOSE-CAPILLARY: 114 mg/dL — AB (ref 65–99)
Glucose-Capillary: 81 mg/dL (ref 65–99)
Glucose-Capillary: 90 mg/dL (ref 65–99)
Glucose-Capillary: 93 mg/dL (ref 65–99)
Glucose-Capillary: 94 mg/dL (ref 65–99)

## 2018-05-16 LAB — BASIC METABOLIC PANEL
ANION GAP: 8 (ref 5–15)
BUN: 7 mg/dL (ref 6–20)
CALCIUM: 10 mg/dL (ref 8.9–10.3)
CO2: 26 mmol/L (ref 22–32)
CREATININE: 0.81 mg/dL (ref 0.44–1.00)
Chloride: 107 mmol/L (ref 101–111)
GFR calc Af Amer: >60 mL/min (ref >60)
GFR calc non Af Amer: >60 mL/min (ref >60)
GLUCOSE: 98 mg/dL (ref 65–99)
Potassium: 3.8 mmol/L (ref 3.5–5.1)
Sodium: 141 mmol/L (ref 135–145)

## 2018-05-16 LAB — CK: Total CK: 153 U/L (ref 38–234)

## 2018-05-16 MED ORDER — IOPAMIDOL (ISOVUE-370) INJECTION 76%
50.0000 mL | Freq: Once | INTRAVENOUS | Status: AC | PRN
Start: 2018-05-16 — End: 2018-05-16
  Administered 2018-05-16: 50 mL via INTRAVENOUS

## 2018-05-16 MED ORDER — IOPAMIDOL (ISOVUE-370) INJECTION 76%
INTRAVENOUS | Status: AC
  Administered 2018-05-16: 02:00:00
  Filled 2018-05-16: qty 50

## 2018-05-16 MED ORDER — ASPIRIN EC 325 MG PO TBEC
325.0000 mg | DELAYED_RELEASE_TABLET | Freq: Every day | ORAL | Status: DC
  Administered 2018-05-17: 325 mg via ORAL
  Filled 2018-05-16: qty 1

## 2018-05-16 NOTE — Consult Note (Signed)
Referring Physician: Dr. Nevada Crane    Chief Complaint: Transient aphasia  HPI: Kristin Lowe is an 77 y.o. female who presents with an episode of transient aphasia. States to ED triage nurse that at noon on Saturday she was talking to her neighbor and noticed that she was mixing up her words and also had slurred speech. This was preceded by a dull 5/10 headache above her left eye, noticed on awakening Saturday morning. No limb weakness, limb numbness or any other deficits endorsed by patient.  Neurological deficits had resolved by the time of the triage assessment. PMHx of bradycardia and HTN. Home meds are daily ASA and MVI.   Past Medical History:  Diagnosis Date  . Bradycardia   . Hypertension     Past Surgical History:  Procedure Laterality Date  . CATARACT EXTRACTION, BILATERAL    . TRIGGER FINGER RELEASE    . TUMOR REMOVAL      Family History  Problem Relation Age of Onset  . Breast cancer Mother   . Diabetes Mother   . Thyroid disease Mother   . Heart disease Father   . Hypertension Sister   . Thyroid disease Sister   . Diabetes Brother   . Hypertension Brother    Social History:  reports that she has quit smoking. She has never used smokeless tobacco. She reports that she does not drink alcohol. Her drug history is not on file.  Allergies: No Known Allergies  Medications:  Prior to Admission:  No medications prior to admission.   Scheduled: . aspirin EC  81 mg Oral Daily  . atorvastatin  40 mg Oral q1800  . enoxaparin (LOVENOX) injection  40 mg Subcutaneous Q24H  . insulin aspart  0-9 Units Subcutaneous TID WC  . iopamidol       Continuous:   ROS: No chest pain. No confusion. Other ROS as per HPI.   Physical Examination: Blood pressure 113/62, pulse 87, temperature 98.1 F (36.7 C), temperature source Oral, resp. rate 18, height _0  (1.651 m), weight 81.5 kg (179 lb 10.8 oz), SpO2 99 %.  HEENT: [place]/AT Lungs: Respirations unlabored Ext: No  edema  Neurologic Examination: Mental Status: Alert, fully oriented, thought content appropriate.  Speech fluent with intact comprehension, repetition and naming.  Able to follow all commands without difficulty. Cranial Nerves: II:  Visual fields intact without extinction to DSS. PERRL. III,IV, VI: EOMI without nystagmus. No ptosis.  V,VII: smile symmetric, facial temp sensation intact bilaterally VIII: hearing intact to conversation IX,X: No hypophonia XI: Symmetric XII: midline tongue extension  Motor: Right : Upper extremity   5/5    Left:     Upper extremity   5/5  Lower extremity   5/5     Lower extremity   5/5 Normal tone throughout; no atrophy noted Sensory: Temp and light touch intact throughout, bilaterally. Deep Tendon Reflexes:  2+ bilateral upper and lower extremities, including achilles reflexes. No asymmetry.  Plantars: Right: downgoing   Left: downgoing Cerebellar: No ataxia with FNF bilaterally Gait: Deferred   Results for orders placed or performed during the hospital encounter of 05/15/18 (from the past 48 hour(s))  Protime-INR     Status: None   Collection Time: 05/15/18  4:49 PM  Result Value Ref Range   Prothrombin Time 13.3 11.4 - 15.2 seconds   INR 1.02     Comment: Performed at [facility] [address] [place], [place] [address]  APTT     Status: None  Collection Time: 05/15/18  4:49 PM  Result Value Ref Range   aPTT 28 24 - 36 seconds    Comment: Performed at [facility], [place] [address] [place] [address]  CBC     Status: None   Collection Time: 05/15/18  4:49 PM  Result Value Ref Range   WBC 7.0 4.0 - 10.5 K/uL   RBC 4.43 3.87 - 5.11 MIL/uL   Hemoglobin 13.1 12.0 - 15.0 g/dL   HCT 41.8 36.0 - 46.0 %   MCV 94.4 78.0 - 100.0 fL   MCH 29.6 26.0 - 34.0 pg   MCHC 31.3 30.0 - 36.0 g/dL   RDW 12.3 11.5 - 15.5 %   Platelets 276 150 - 400 K/uL    Comment: Performed at [facility] [address] [place], [place]  [address]  Differential     Status: None   Collection Time: 05/15/18  4:49 PM  Result Value Ref Range   Neutrophils Relative % 69 %   Neutro Abs 4.8 1.7 - 7.7 K/uL   Lymphocytes Relative 22 %   Lymphs Abs 1.6 0.7 - 4.0 K/uL   Monocytes Relative 6 %   Monocytes Absolute 0.4 0.1 - 1.0 K/uL   Eosinophils Relative 2 %   Eosinophils Absolute 0.2 0.0 - 0.7 K/uL   Basophils Relative 1 %   Basophils Absolute 0.1 0.0 - 0.1 K/uL   Immature Granulocytes 0 %   Abs Immature Granulocytes 0.0 0.0 - 0.1 K/uL    Comment: Performed at [facility] [address] [place], [place] [address]  Comprehensive metabolic panel     Status: Abnormal   Collection Time: 05/15/18  4:49 PM  Result Value Ref Range   Sodium 140 135 - 145 mmol/L   Potassium 3.7 3.5 - 5.1 mmol/L   Chloride 107 101 - 111 mmol/L   CO2 27 22 - 32 mmol/L   Glucose, Bld 154 (H) 65 - 99 mg/dL   BUN 11 6 - 20 mg/dL   Creatinine, Ser 0.76 0.44 - 1.00 mg/dL   Calcium 10.3 8.9 - 10.3 mg/dL   Total Protein 7.2 6.5 - 8.1 g/dL   Albumin 4.2 3.5 - 5.0 g/dL   AST 28 15 - 41 U/L   ALT 25 14 - 54 U/L   Alkaline Phosphatase 65 38 - 126 U/L   Total Bilirubin 0.4 0.3 - 1.2 mg/dL   GFR calc non Af Amer >60 >60 mL/min   GFR calc Af Amer >60 >60 mL/min    Comment: (NOTE) The eGFR has been calculated using the CKD EPI equation. This calculation has not been validated in all clinical situations. eGFR's persistently <60 mL/min signify possible Chronic Kidney Disease.    Anion gap 6 5 - 15    Comment: Performed at [facility] [address], [place], [place] [address]  I-stat troponin, ED     Status: None   Collection Time: 05/15/18  4:53 PM  Result Value Ref Range   Troponin i, poc 0.01 0.00 - 0.08 ng/mL   Comment 3            Comment: Due to the release kinetics of cTnI, a negative result within the first hours of the onset of symptoms does not rule out myocardial infarction with certainty. If myocardial infarction is still  suspected, repeat the test at appropriate intervals.   I-Stat Chem 8, ED     Status: Abnormal  Collection Time: 05/15/18  4:55 PM  Result Value Ref Range   Sodium 141 135 - 145 mmol/L   Potassium 3.6 3.5 - 5.1 mmol/L   Chloride 104 101 - 111 mmol/L   BUN 13 6 - 20 mg/dL   Creatinine, Ser 0.80 0.44 - 1.00 mg/dL   Glucose, Bld 148 (H) 65 - 99 mg/dL   Calcium, Ion 1.30 1.15 - 1.40 mmol/L   TCO2 24 22 - 32 mmol/L   Hemoglobin 13.3 12.0 - 15.0 g/dL   HCT 39.0 36.0 - 46.0 %  Lipid panel     Status: Abnormal   Collection Time: 05/15/18  9:19 PM  Result Value Ref Range   Cholesterol 182 0 - 200 mg/dL   Triglycerides 115 <150 mg/dL   HDL 53 >40 mg/dL   Total CHOL/HDL Ratio 3.4 RATIO   VLDL 23 0 - 40 mg/dL   LDL Cholesterol 106 (H) 0 - 99 mg/dL    Comment:        Total Cholesterol/HDL:CHD Risk Coronary Heart Disease Risk Table                     Men   Women  1/2 Average Risk   3.4   3.3  Average Risk       5.0   4.4  2 X Average Risk   9.6   7.1  3 X Average Risk  23.4   11.0        Use the calculated Patient Ratio above and the CHD Risk Table to determine the patient's CHD Risk.        ATP III CLASSIFICATION (LDL):  <100     mg/dL   Optimal  100-129  mg/dL   Near or Above                    Optimal  130-159  mg/dL   Borderline  160-189  mg/dL   High  >190     mg/dL   Very High Performed at [facility] [address] [place], [place] [address]   Hemoglobin A1c     Status: None   Collection Time: 05/15/18  9:19 PM  Result Value Ref Range   Hgb A1c MFr Bld 5.5 4.8 - 5.6 %    Comment: (NOTE) Pre diabetes:          5.7%-6.4% Diabetes:              >6.4% Glycemic control for   <7.0% adults with diabetes    Mean Plasma Glucose 111.15 mg/dL    Comment: Performed at [facility] [address] [place], [place] [address]   Ct Head Wo Contrast  Result Date: 05/15/2018 CLINICAL DATA:  Headache EXAM: CT HEAD WITHOUT CONTRAST TECHNIQUE: Contiguous axial  images were obtained from the base of the skull through the vertex without intravenous contrast. COMPARISON:  None. FINDINGS: Brain: No acute territorial infarction, intracranial hemorrhage, or mass lesion is visualized. The ventricles are nonenlarged. Left sub insular hypodensity. Vascular: No hyperdense vessels.  Carotid vascular calcification. Skull: Normal. Negative for fracture or focal lesion. Sinuses/Orbits: Mucosal thickening in the ethmoid and maxillary sinuses. Other: None IMPRESSION: 1. Negative for large vessel territorial infarct, hemorrhage or mass. 2. Small focal hypodensity in the left sub insular region, possible age indeterminate lacunar infarct. Electronically Signed   By: Donavan Foil M.D.   On: 05/15/2018 18:41    Assessment: 77 y.o. female presenting with unilateral  dull headache followed by transient expressive aphasia and dysarthria. Speech deficit now resolved. Overall presentation most consistent with TIA involving Broca's area 1. Neurological exam is normal. 2. CT shows a.small focal hypodensity in the left sub insular region, possible age indeterminate lacunar infarct. 3. Stroke Risk Factors - HTN  Plan: 1. HgbA1c, fasting lipid panel 2. MRI, MRA of the brain without contrast 3. PT consult, OT consult, Speech consult 4. Echocardiogram 5. Carotid dopplers 6. Prophylactic therapy- agree with starting ASA and atorvastatin. Obtain baseline CK level 7. Risk factor modification 8. Telemetry monitoring 9. Frequent neuro checks 10. Permissive HTN x 24 hours   _0  signed: Dr. Kerney Elbe 05/16/2018, 3:23 AM

## 2018-05-16 NOTE — Progress Notes (Signed)
VASCULAR LAB    Patient had normal CTA of the neck. Please advise if carotid duplex is still needed.  Thank you,  Eletha Culbertson, RVT 05/16/2018, 7:07 AM

## 2018-05-16 NOTE — Plan of Care (Signed)
Kristin Lowe is a possible discharge today after testing.  Her deficits have resolved, and she reports no pain since last night (which resolved spontaneously).  She is alert, oriented, calm and cooperative--a very pleasant patient.    The patient is interested in integrative medicine, exercise and supplements.  We discussed diet modifications, exercise and I encouraged her to talk to her provider about appropriate supplements and integrative treatment/prevention measures.

## 2018-05-16 NOTE — Evaluation (Addendum)
Occupational Therapy Evaluation and Discharge Patient Details Name: Kristin Lowe MRN: [id number] DOB: [birth year] Today's Date: 05/16/2018    History of Present Illness Patient is a 77 year old female presenting with intermittent neurologic complaints.  Patient reports that this morning she noticed that she was unable to pronounce her words.  She was talking to neighbors and she was slurring her speech having trouble difficulty speaking. CT: small focal hypodensity in the left sub-insular region, possible age indeterminate lacunar infarct   Clinical Impression   This 77 yo female admitted with above presents to acute OT at an independent to Mod I level (stairs), no further OT needs we will sign off. No PT needs Identified and I have made them aware We will sign off.    Follow Up Recommendations  No OT follow up    Equipment Recommendations  None recommended by OT       Precautions / Restrictions Precautions Precautions: None Restrictions Weight Bearing Restrictions: No      Mobility Bed Mobility Overal bed mobility: Independent                Transfers Overall transfer level: Independent               General transfer comment: Able to do stairs using one rail Mod I    Balance Overall balance assessment: Independent                                         ADL either performed or assessed with clinical judgement   ADL Overall ADL's : Independent                                             Vision Baseline Vision/History: Wears glasses Wears Glasses: Distance only Patient Visual Report: No change from baseline Additional Comments: Glasses have prisims in them            Pertinent Vitals/Pain Pain Assessment: 0-10 Pain Score: 3  Pain Location: left kne (reports a meniscus tear) Pain Descriptors / Indicators: Aching;Sore Pain Intervention(s): Limited activity within patient's tolerance;Monitored during session;Ice  applied(ususally puts ice on it daily)     Hand Dominance Right   Extremity/Trunk Assessment Upper Extremity Assessment Upper Extremity Assessment: Overall WFL for tasks assessed   Lower Extremity Assessment Lower Extremity Assessment: Overall WFL for tasks assessed       Communication Communication Communication: No difficulties   Cognition Arousal/Alertness: Awake/alert Behavior During Therapy: WFL for tasks assessed/performed Overall Cognitive Status: Within Functional Limits for tasks assessed                                     General Comments  Pt able to bend over and pick up pen off of floor, look right/left/up/down without balance issues as she walked up and down hallway            Home Living Family/patient expects to be discharged to:: Private residence Living Arrangements: Alone Available Help at Discharge: Family Type of Home: House Home Access: Level entry     Home Layout: Multi-level Alternate Level Stairs-Number of Steps: 6,7 rails Alternate Level Stairs-Rails: Right;Left;Can reach both Bathroom Shower/Tub: Retail buyer Accessibility:  No              Prior Functioning/Environment Level of Independence: Independent        Comments: Very active pta                 OT Goals(Current goals can be found in the care plan section) Acute Rehab OT Goals Patient Stated Goal: hopes to go home today  OT Frequency:                AM-PAC PT "6 Clicks" Daily Activity     Outcome Measure Help from another person eating meals?: None Help from another person taking care of personal grooming?: None Help from another person toileting, which includes using toliet, bedpan, or urinal?: None Help from another person bathing (including washing, rinsing, drying)?: None Help from another person to put on and taking off regular upper body clothing?: None Help from another person to put on and  taking off regular lower body clothing?: None 6 Click Score: 24   End of Session Equipment Utilized During Treatment: Gait belt Nurse Communication: Mobility status(can be up and about by herself)  Activity Tolerance: Patient tolerated treatment well Patient left: in chair;with call bell/phone within reach  OT Visit Diagnosis: Muscle weakness (generalized) (M62.81);Pain Pain - Right/Left: Left Pain - part of body: Knee                Time: 1751-0258 OT Time Calculation (min): 25 min Charges:  OT General Charges $OT Visit: 1 Visit OT Evaluation $OT Eval Moderate Complexity: 1 Mod OT Treatments $Self Care/Home Management : 8-22 mins Golden Circle, OTR/L [phone] 05/16/2018

## 2018-05-16 NOTE — Progress Notes (Signed)
SLP Cancellation Note  Patient Details Name: Merari Pion MRN: [id number] DOB: 1941-03-18   Cancelled treatment:       Reason Eval/Treat Not Completed: SLP screened, no needs identified, will sign off.  Patient complained with OT of feeling some hesitancy with her speech that had resolved by the time ST arrived for evaluation.  She had passed the nursing swallow screen and had no complaints of dysphagia type symptoms.  Shelly Flatten, MA, [name] SLP [phone]  Lamar Sprinkles 05/16/2018, 11:05 AM

## 2018-05-16 NOTE — Progress Notes (Signed)
MRI completed, revealing two punctate foci of acute/early subacute infarction within the left posterior temporal lobe. No associated hemorrhage or mass effect. Also noted are mild for age chronic microvascular ischemic changes and parenchymal volume loss of the brain.  Echocardiogram completed today revealed no cardiac source of emboli.   CTA head and neck was normal.   EKG showed no atrial fibrillation.  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 ASA dosage will be increased to 325 mg po qd as patient had the punctate strokes while on a home dose of 81 mg.   Electronically signed: Dr. Kerney Elbe

## 2018-05-16 NOTE — Progress Notes (Signed)
PROGRESS NOTE    Kristin Lowe  KGY:[id number] DOB: Jun 09, 1941 DOA: 05/15/2018 PCP: Kristin Infante, MD      Brief Narrative:  Kristin Lowe is a 77 y.o. F with no significant PMHx who presents with transient word finding difficulty. Mid-day on the day of admission, she was talking to her neighbor and noticed that she was mixing up her words and also had slurred speech. This was preceded by a dull 5/10 headache above her left eye, noticed on awakening Saturday morning. No limb weakness, limb numbness or any other deficits endorsed by patient.  Neurological deficits had resolved by the time of the triage assessment.     TIA -Symptoms completely resolved by time of presentation.    -Non-invasive angiography showed no significant intra or extracranial atherosclerosis and normal carotids -Echocardiogram showed no cardiogenic source of embolism -Lipids ordered: discharged on new atorvastatin 40 mg daily -Aspirin ordered at admission --> discharged on 81 mg daily -Atrial fibrillation: not observed on telemetry -tPA not given because symptoms had resolved -Dysphagia screen ordered in ER -PT eval ordered: recommended no further PT follow up -Smoking cessation: N/A, non-smoker    Hypertension BP >160/100 at admission.  Normalized overnight without antihypertensives.  Follow up with PCP.    MDM and disposition Plan: The below labs and imaging reports were reviewed and summarized above.    The patient was admitted with Aphasia, work up for stroke pending.        Subjective: Feeling well.  No slurred speech, headaches resolved.  No fever, neck pain.  No focal weakness, gait ataxia, visual change, no numbness, no palpitations.   Vitals:   05/16/18 0726 05/16/18 1121  BP: (!) 111/55 125/66  Pulse: (!) 46 (!) 58  Resp: 16 16  Temp: 98.1 F (36.7 C) 98.2 F (36.8 C)  SpO2: 99% 97%   Vitals:   05/16/18 0036 05/16/18 0553 05/16/18 0726 05/16/18 1121  BP: 113/62 (!) 100/49 (!) 111/55  125/66  Pulse: 87 (!) 48 (!) 46 (!) 58  Resp: 18 18 16 16   Temp: 98.1 F (36.7 C) 98.1 F (36.7 C) 98.1 F (36.7 C) 98.2 F (36.8 C)  TempSrc: Oral Oral Oral Oral  SpO2: 99% 98% 99% 97%  Weight:      Height:          General: Healthy, alert and in no distress.  Responds appropriately to questions.  Eye contact, hygiene appropriate. HEENT: Corneas clear, conjunctivae and sclerae normal without injection or icterus, lids and lashes normal.  Visual tracking smooth.  OP moist without erythema, exudates, cobblestoning, or ulcers.  No airway deformities.  Neck supple.   Cardiac: RRR, nl S1-S2, no murmurs, rubs, gallops.  Capillary refill is less than 2 seconds.  No LE edema. Respiratory: Normal respiratory rate and rhythm.  CTAB without rales or wheezes. Extremities: No deformities/injuries.  5/5 grip strength and upper extremity flexion/extension, symmetrically.  Extremities are warm and well-perfused. Neuro: Sensorium intact.  Cranial nerves 3-12 intact. Speech is fluent.  Naming is grossly intact, and the patient's recall, recent and remote, as well as general fund of knowledge seem within normal limits.  Muscle tone normal, without fasciculations.  Moves all extremities equally and with normal coordination.  Attention span and concentration are within normal limits.  Psych: Full range of affect.  Normal rate and rhythm of speech.  Thought content appropriate, and thought process linear. Attention and concentration are normal.         Data Reviewed: I have personally  reviewed following labs and imaging studies:  CBC: Recent Labs  Lab 05/15/18 1649 05/15/18 1655 05/16/18 0849  WBC 7.0  --  5.7  NEUTROABS 4.8  --   --   HGB 13.1 13.3 13.3  HCT 41.8 39.0 42.0  MCV 94.4  --  93.5  PLT 276  --  408   Basic Metabolic Panel: Recent Labs  Lab 05/15/18 1649 05/15/18 1655 05/16/18 0849  NA 140 141 141  K 3.7 3.6 3.8  CL 107 104 107  CO2 27  --  26  GLUCOSE 154* 148* 98  BUN  11 13 7   CREATININE 0.76 0.80 0.81  CALCIUM 10.3  --  10.0   GFR: Estimated Creatinine Clearance: 63.3 mL/min (by C-G formula based on SCr of 0.81 mg/dL). Liver Function Tests: Recent Labs  Lab 05/15/18 1649  AST 28  ALT 25  ALKPHOS 65  BILITOT 0.4  PROT 7.2  ALBUMIN 4.2   No results for input(s): LIPASE, AMYLASE in the last 168 hours. No results for input(s): AMMONIA in the last 168 hours. Coagulation Profile: Recent Labs  Lab 05/15/18 1649  INR 1.02   Cardiac Enzymes: Recent Labs  Lab 05/16/18 0849  CKTOTAL 153   BNP (last 3 results) No results for input(s): PROBNP in the last 8760 hours. HbA1C: Recent Labs    05/15/18 2119  HGBA1C 5.5   CBG: Recent Labs  Lab 05/16/18 0323 05/16/18 0642 05/16/18 0844 05/16/18 1147 05/16/18 1648  GLUCAP 81 90 114* 93 94   Lipid Profile: Recent Labs    05/15/18 2119  CHOL 182  HDL 53  LDLCALC 106*  TRIG 115  CHOLHDL 3.4   Thyroid Function Tests: No results for input(s): TSH, T4TOTAL, FREET4, T3FREE, THYROIDAB in the last 72 hours. Anemia Panel: No results for input(s): VITAMINB12, FOLATE, FERRITIN, TIBC, IRON, RETICCTPCT in the last 72 hours. Urine analysis: No results found for: COLORURINE, APPEARANCEUR, LABSPEC, PHURINE, GLUCOSEU, HGBUR, BILIRUBINUR, KETONESUR, PROTEINUR, UROBILINOGEN, NITRITE, LEUKOCYTESUR Sepsis Labs: @LABRCNTIP (procalcitonin:4,lacticacidven:4)  )No results found for this or any previous visit (from the past 240 hour(s)).       Radiology Studies: Ct Angio Head W Or Wo Contrast  Result Date: 05/16/2018 CLINICAL DATA:  Initial evaluation for slurred speech, now resolved. EXAM: CT ANGIOGRAPHY HEAD AND NECK TECHNIQUE: Multidetector CT imaging of the head and neck was performed using the standard protocol during bolus administration of intravenous contrast. Multiplanar CT image reconstructions and MIPs were obtained to evaluate the vascular anatomy. Carotid stenosis measurements (when  applicable) are obtained utilizing NASCET criteria, using the distal internal carotid diameter as the denominator. CONTRAST:  72mL ISOVUE-370 IOPAMIDOL (ISOVUE-370) INJECTION 76% COMPARISON:  Prior CT from 05/15/2018 FINDINGS: CTA NECK FINDINGS Aortic arch: Visualized aortic arch of normal caliber with normal 3 vessel morphology. No flow-limiting stenosis about the origin of the great vessels. Mild atheromatous plaque within the proximal descending intrathoracic aorta. Visualized subclavian arteries widely patent. Right carotid system: Right common and internal carotid arteries widely patent without stenosis, dissection, or occlusion. No atheromatous narrowing about the right carotid bifurcation. Left carotid system: Left common and internal carotid arteries are widely patent without stenosis, dissection, or occlusion. No atheromatous narrowing about the left carotid bifurcation. Vertebral arteries: Both of the vertebral arteries arise from the subclavian arteries. Left vertebral artery dominant. Right vertebral artery diffusely hypoplastic. Vertebral arteries patent within the neck without stenosis, dissection, or occlusion. Skeleton: No acute osseous abnormality. No discrete osseous lesions. Moderate spondylolysis at C4-5 through C6-7. Other neck:  Soft tissues of the neck demonstrate no acute finding. Upper chest: Visualized upper chest within normal limits. Partially visualized lungs are grossly clear. Review of the MIP images confirms the above findings CTA HEAD FINDINGS Anterior circulation: Internal carotid arteries widely patent to the termini without stenosis. A1 segments, anterior communicating artery common anterior cerebral arteries widely patent bilaterally. No M1 stenosis or occlusion. Distal MCA branches well perfused and symmetric. Posterior circulation: Dominant left vertebral artery widely patent to the vertebrobasilar junction. Hypoplastic right vertebral artery essentially terminates at the skull  base. Left PICA patent. Right PICA not visualized. Dominant right AICA. Basilar widely patent to its distal aspect. Superior cerebral arteries patent bilaterally. Predominant fetal type origin of the PCAs supplied via widely patent posterior communicating arteries. PCAs widely patent to their distal aspects. Venous sinuses: Patent. Anatomic variants: Fetal type origin of the PCAs with diminutive vertebrobasilar system. Delayed phase: No abnormal enhancement. Review of the MIP images confirms the above findings IMPRESSION: Normal CTA of the head and neck. No large vessel occlusion. No high-grade or correctable stenosis Electronically Signed   By: Jeannine Boga M.D.   On: 05/16/2018 04:02   Ct Head Wo Contrast  Result Date: 05/15/2018 CLINICAL DATA:  Headache EXAM: CT HEAD WITHOUT CONTRAST TECHNIQUE: Contiguous axial images were obtained from the base of the skull through the vertex without intravenous contrast. COMPARISON:  None. FINDINGS: Brain: No acute territorial infarction, intracranial hemorrhage, or mass lesion is visualized. The ventricles are nonenlarged. Left sub insular hypodensity. Vascular: No hyperdense vessels.  Carotid vascular calcification. Skull: Normal. Negative for fracture or focal lesion. Sinuses/Orbits: Mucosal thickening in the ethmoid and maxillary sinuses. Other: None IMPRESSION: 1. Negative for large vessel territorial infarct, hemorrhage or mass. 2. Small focal hypodensity in the left sub insular region, possible age indeterminate lacunar infarct. Electronically Signed   By: Donavan Foil M.D.   On: 05/15/2018 18:41   Ct Angio Neck W Or Wo Contrast  Result Date: 05/16/2018 CLINICAL DATA:  Initial evaluation for slurred speech, now resolved. EXAM: CT ANGIOGRAPHY HEAD AND NECK TECHNIQUE: Multidetector CT imaging of the head and neck was performed using the standard protocol during bolus administration of intravenous contrast. Multiplanar CT image reconstructions and MIPs were  obtained to evaluate the vascular anatomy. Carotid stenosis measurements (when applicable) are obtained utilizing NASCET criteria, using the distal internal carotid diameter as the denominator. CONTRAST:  55mL ISOVUE-370 IOPAMIDOL (ISOVUE-370) INJECTION 76% COMPARISON:  Prior CT from 05/15/2018 FINDINGS: CTA NECK FINDINGS Aortic arch: Visualized aortic arch of normal caliber with normal 3 vessel morphology. No flow-limiting stenosis about the origin of the great vessels. Mild atheromatous plaque within the proximal descending intrathoracic aorta. Visualized subclavian arteries widely patent. Right carotid system: Right common and internal carotid arteries widely patent without stenosis, dissection, or occlusion. No atheromatous narrowing about the right carotid bifurcation. Left carotid system: Left common and internal carotid arteries are widely patent without stenosis, dissection, or occlusion. No atheromatous narrowing about the left carotid bifurcation. Vertebral arteries: Both of the vertebral arteries arise from the subclavian arteries. Left vertebral artery dominant. Right vertebral artery diffusely hypoplastic. Vertebral arteries patent within the neck without stenosis, dissection, or occlusion. Skeleton: No acute osseous abnormality. No discrete osseous lesions. Moderate spondylolysis at C4-5 through C6-7. Other neck: Soft tissues of the neck demonstrate no acute finding. Upper chest: Visualized upper chest within normal limits. Partially visualized lungs are grossly clear. Review of the MIP images confirms the above findings CTA HEAD FINDINGS Anterior circulation: Internal carotid  arteries widely patent to the termini without stenosis. A1 segments, anterior communicating artery common anterior cerebral arteries widely patent bilaterally. No M1 stenosis or occlusion. Distal MCA branches well perfused and symmetric. Posterior circulation: Dominant left vertebral artery widely patent to the vertebrobasilar  junction. Hypoplastic right vertebral artery essentially terminates at the skull base. Left PICA patent. Right PICA not visualized. Dominant right AICA. Basilar widely patent to its distal aspect. Superior cerebral arteries patent bilaterally. Predominant fetal type origin of the PCAs supplied via widely patent posterior communicating arteries. PCAs widely patent to their distal aspects. Venous sinuses: Patent. Anatomic variants: Fetal type origin of the PCAs with diminutive vertebrobasilar system. Delayed phase: No abnormal enhancement. Review of the MIP images confirms the above findings IMPRESSION: Normal CTA of the head and neck. No large vessel occlusion. No high-grade or correctable stenosis Electronically Signed   By: Jeannine Boga M.D.   On: 05/16/2018 04:02   Mr Brain Wo Contrast  Result Date: 05/16/2018 CLINICAL DATA:  77 y/o F; episode of slurred speech and difficulty speaking. TIA, initial exam. EXAM: MRI HEAD WITHOUT CONTRAST TECHNIQUE: Multiplanar, multiecho pulse sequences of the brain and surrounding structures were obtained without intravenous contrast. COMPARISON:  05/15/2018 CT head.  05/16/2018 CTA head. FINDINGS: Brain: 2 punctate foci of reduced diffusion are present within the left posterior temporal lobe compatible with acute/early subacute infarction. No associated hemorrhage or mass effect. Scattered nonspecific foci of T2 FLAIR hyperintense signal abnormality in subcortical and periventricular white matter are compatible with mild chronic microvascular ischemic changes for age. Mild brain parenchymal volume loss. No abnormal susceptibility hypointensity to indicate intracranial hemorrhage. No extra-axial collection, hydrocephalus, or herniation. 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 Vascular: Normal flow voids. Skull and upper cervical spine: Normal marrow signal. Sinuses/Orbits: Negative. Other: None. IMPRESSION: 1. Two punctate foci of acute/early  subacute infarction are present within the left posterior temporal lobe. No associated hemorrhage or mass effect. 2. Mild for age chronic microvascular ischemic changes and parenchymal volume loss of the brain. These results will be called to the ordering clinician or representative by the Radiologist Assistant, and communication documented in the PACS or zVision Dashboard. Electronically Signed   By: Kristine Garbe M.D.   On: 05/16/2018 21:10        Scheduled Meds: . [START ON 05/17/2018] aspirin EC  325 mg Oral Daily  . atorvastatin  40 mg Oral q1800  . enoxaparin (LOVENOX) injection  40 mg Subcutaneous Q24H  . insulin aspart  0-9 Units Subcutaneous TID WC   Continuous Infusions:   LOS: 0 days    Time spent: 25 minutes    Edwin Dada, MD [facility] 05/16/2018, 10:11 AM     Pager [phone] --- please page though AMION:  [email or web address] If 7PM-7AM, please contact night-coverage

## 2018-05-16 NOTE — Progress Notes (Signed)
PT Discharge Note  Patient Details Name: Kristin Lowe MRN: [id number] DOB: Dec 29, 1940   Cancelled Treatment:    Reason Eval/Treat Not Completed: PT screened, no needs identified, will sign off - Pt is independent with mobility and no limitations per OT order.    Kearney Hard [facility] 05/16/2018, 11:30 AM

## 2018-05-16 NOTE — Progress Notes (Signed)
STROKE TEAM PROGRESS NOTE   HISTORY OF PRESENT ILLNESS (per record) Kristin Lowe is an 77 y.o. female who presents with an episode of transient aphasia. States to ED triage nurse that at noon on Saturday she was talking to her neighbor and noticed that she was mixing up her words and also had slurred speech. This was preceded by a dull 5/10 headache above her left eye, noticed on awakening Saturday morning. No limb weakness, limb numbness or any other deficits endorsed by patient.  Neurological deficits had resolved by the time of the triage assessment. PMHx of bradycardia and HTN. Home meds are daily ASA and MVI.      SUBJECTIVE (INTERVAL HISTORY) No family members are present.  The patient has had no further symptoms.  It is believed that she had a TIA.  An MRI was pending.echo was unremarkable and CTA was normal as well    OBJECTIVE Temp:  [98.1 F (36.7 C)-98.5 F (36.9 C)] 98.2 F (36.8 C) (06/23 1121) Pulse Rate:  [46-87] 58 (06/23 1121) Cardiac Rhythm: Sinus bradycardia (06/23 0700) Resp:  [16-21] 16 (06/23 1121) BP: (100-169)/(49-93) 125/66 (06/23 1121) SpO2:  [97 %-100 %] 97 % (06/23 1121) Weight:  [179 lb 10.8 oz (81.5 kg)] 179 lb 10.8 oz (81.5 kg) (06/22 2107)  CBC:  Recent Labs  Lab 05/15/18 1649 05/15/18 1655 05/16/18 0849  WBC 7.0  --  5.7  NEUTROABS 4.8  --   --   HGB 13.1 13.3 13.3  HCT 41.8 39.0 42.0  MCV 94.4  --  93.5  PLT 276  --  323    Basic Metabolic Panel:  Recent Labs  Lab 05/15/18 1649 05/15/18 1655 05/16/18 0849  NA 140 141 141  K 3.7 3.6 3.8  CL 107 104 107  CO2 27  --  26  GLUCOSE 154* 148* 98  BUN 11 13 7   CREATININE 0.76 0.80 0.81  CALCIUM 10.3  --  10.0    Lipid Panel:     Component Value Date/Time   CHOL 182 05/15/2018 2119   TRIG 115 05/15/2018 2119   HDL 53 05/15/2018 2119   CHOLHDL 3.4 05/15/2018 2119   VLDL 23 05/15/2018 2119   LDLCALC 106 (H) 05/15/2018 2119   HgbA1c:  Lab Results  Component Value Date   HGBA1C 5.5 05/15/2018   Urine Drug Screen: No results found for: LABOPIA, COCAINSCRNUR, LABBENZ, AMPHETMU, THCU, LABBARB  Alcohol Level No results found for: ETH  IMAGING   Ct Angio Head W Or Wo Contrast Ct Angio Neck W Or Wo Contrast 05/16/2018 IMPRESSION:  Normal CTA of the head and neck. No large vessel occlusion. No high-grade or correctable stenosis    Ct Head Wo Contrast  05/15/2018 IMPRESSION:  1. Negative for large vessel territorial infarct, hemorrhage or mass.  2. Small focal hypodensity in the left sub insular region, possible age indeterminate lacunar infarct.    MR Brain Wo Contrast - pending    Transthoracic Echocardiogram -Left ventricle: The cavity size was normal. Systolic function was   normal. The estimated ejection fraction was in the range of 55%   to 60%. Wall motion was normal; there were no regional wall   motion abnormalities.    PHYSICAL EXAM Vitals:   05/16/18 0036 05/16/18 0553 05/16/18 0726 05/16/18 1121  BP: 113/62 (!) 100/49 (!) 111/55 125/66  Pulse: 87 (!) 48 (!) 46 (!) 58  Resp: 18 18 16 16   Temp: 98.1 F (36.7 C) 98.1 F (36.7 C) 98.1  F (36.7 C) 98.2 F (36.8 C)  TempSrc: Oral Oral Oral Oral  SpO2: 99% 98% 99% 97%  Weight:      Height:       Pleasant obese elderly  Caucasian lady not in distress. . Afebrile. Head is nontraumatic. Neck is supple without bruit.    Cardiac exam no murmur or gallop. Lungs are clear to auscultation. Distal pulses are well felt.  Neurological Exam ;  Awake  Alert oriented x 3. Normal speech and language.eye movements full without nystagmus.fundi were not visualized. Vision acuity and fields appear normal. Hearing is normal. Palatal movements are normal. Face symmetric. Tongue midline. Normal strength, tone, reflexes and coordination. Normal sensation. Gait deferred.         ASSESSMENT/PLAN [name] is a 77 y.o. female with history of hypertension and bradycardia presenting with  transient headache and speech difficulties. She did not receive IV t-PA due to resolution of deficits.  Possible left hemispheric TIA:    Resultant - resolution of deficits  CT head -  Small focal hypodensity in the left sub insular region, possible age indeterminate lacunar infarct.   MRI head - pending  MRA head - not performed  CTA H&N - negative  Carotid Doppler - CTA H&N  2D Echo - normal EF. No clot  LDL - 106  HgbA1c - 5.5  VTE prophylaxis -Lovenox Diet Order           Diet heart healthy/carb modified Room service appropriate? Yes; Fluid consistency: Thin  Diet effective now          No antithrombotic prior to admission, now on aspirin 81 mg daily  Patient counseled to be compliant with her antithrombotic medications  Ongoing aggressive stroke risk factor management  Therapy recommendations: Patient screened.  No needs identified.  Disposition:  Pending  Hypertension  Blood pressure tends to run mildly low . Permissive hypertension (OK if < 220/120) but gradually normalize in 5-7 days . Long-term BP goal normotensive  Hyperlipidemia  Lipid lowering medication PTA: None  LDL 106, goal < 70  Current lipid lowering medication: Lipitor 40 mg daily  Continue statin at discharge   Other Stroke Risk Factors  Advanced age  Former cigarette smoker - quit  Obesity, Body mass index is 29.9 kg/m., recommend weight loss, diet and exercise as appropriate    Other Active Problems  Bradycardia   Plan / Recommendations   Await MRI  Await 2D echo  Continue aspirin 81 mg daily  Continue Lipitor 40 mg daily   Hospital day # 0  Mikey Bussing PA-C [facility] Pager [phone] 05/16/2018, 2:55 PM I have personally examined this patient, reviewed notes, independently viewed imaging studies, participated in medical decision making and plan of care.ROS completed by me personally and pertinent positives fully documented  I have  made any additions or clarifications directly to the above note. Agree with note above.  She presented with transient episode of speech difficulties possibly a left hemispheric TIA.  MRI is pending.  Recommend aspirin for now and if MRI is confirmed dual antiplatelet therapy.  Add statin.  Aggressive risk factor modification.  Long discussion with patient and with Dr. Loleta Books about her presentation and plan for evaluation and treatment and answered questions.  Greater than 50% time during this 35-minute visit was spent on counseling and coordination of care about her TIA and stroke risk and planning treatment. Antony Contras, MD Medical Director [facility] Pager: [phone] 05/16/2018 5:27  PM   To contact Stroke Continuity provider, please refer to [email or web address] After hours, contact General Neurology

## 2018-05-16 NOTE — Progress Notes (Signed)
  Echocardiogram 2D Echocardiogram has been performed.  Kristin Lowe 05/16/2018, 4:01 PM

## 2018-05-17 ENCOUNTER — Encounter (HOSPITAL_COMMUNITY)
Admission: EM | Disposition: A | Discharge status: Home / Self Care | Payer: Self-pay | Source: Home / Self Care | Attending: Physician Assistant

## 2018-05-17 ENCOUNTER — Encounter (HOSPITAL_COMMUNITY): Payer: Self-pay | Admitting: *Deleted

## 2018-05-17 ENCOUNTER — Observation Stay (HOSPITAL_BASED_OUTPATIENT_CLINIC_OR_DEPARTMENT_OTHER): Payer: Medicare Other

## 2018-05-17 DIAGNOSIS — I6389 Other cerebral infarction: Secondary | ICD-10-CM

## 2018-05-17 DIAGNOSIS — G459 Transient cerebral ischemic attack, unspecified: Secondary | ICD-10-CM | POA: Diagnosis not present

## 2018-05-17 DIAGNOSIS — R001 Bradycardia, unspecified: Secondary | ICD-10-CM | POA: Diagnosis not present

## 2018-05-17 DIAGNOSIS — I639 Cerebral infarction, unspecified: Secondary | ICD-10-CM | POA: Diagnosis not present

## 2018-05-17 DIAGNOSIS — I10 Essential (primary) hypertension: Secondary | ICD-10-CM | POA: Diagnosis not present

## 2018-05-17 HISTORY — PX: LOOP RECORDER INSERTION: EP1214

## 2018-05-17 HISTORY — PX: TEE WITHOUT CARDIOVERSION: SHX5443

## 2018-05-17 HISTORY — PX: LOOP RECORDER INSERTION: SHX6722

## 2018-05-17 LAB — GLUCOSE, CAPILLARY
GLUCOSE-CAPILLARY: 127 mg/dL — AB (ref 65–99)
Glucose-Capillary: 83 mg/dL (ref 65–99)
Glucose-Capillary: 82 mg/dL (ref 65–99)

## 2018-05-17 SURGERY — LOOP RECORDER INSERTION

## 2018-05-17 SURGERY — ECHOCARDIOGRAM, TRANSESOPHAGEAL
Anesthesia: Moderate Sedation

## 2018-05-17 MED ORDER — ATORVASTATIN CALCIUM 40 MG PO TABS: 40.0000 mg | ORAL_TABLET | Freq: Every day | ORAL | 3 refills | Status: DC

## 2018-05-17 MED ORDER — LIDOCAINE VISCOUS HCL 2 % MT SOLN
OROMUCOSAL | Status: DC | PRN
  Administered 2018-05-17: 20 mL via OROMUCOSAL

## 2018-05-17 MED ORDER — FENTANYL CITRATE (PF) 100 MCG/2ML IJ SOLN
INTRAMUSCULAR | Status: DC | PRN
  Administered 2018-05-17: 25 ug via INTRAVENOUS
  Administered 2018-05-17: 12.5 ug via INTRAVENOUS
  Administered 2018-05-17: 25 ug via INTRAVENOUS

## 2018-05-17 MED ORDER — CLOPIDOGREL BISULFATE 75 MG PO TABS: 75.0000 mg | ORAL_TABLET | Freq: Every day | ORAL | 3 refills | Status: DC

## 2018-05-17 MED ORDER — SODIUM CHLORIDE 0.9 % IV SOLN
INTRAVENOUS | Status: DC
  Administered 2018-05-17: 13:00:00 via INTRAVENOUS

## 2018-05-17 MED ORDER — MIDAZOLAM HCL 5 MG/ML IJ SOLN
INTRAMUSCULAR | Status: AC
Start: 2018-05-17 — End: ?
  Filled 2018-05-17: qty 2

## 2018-05-17 MED ORDER — LIDOCAINE VISCOUS HCL 2 % MT SOLN
OROMUCOSAL | Status: AC
  Filled 2018-05-17: qty 15

## 2018-05-17 MED ORDER — FENTANYL CITRATE (PF) 100 MCG/2ML IJ SOLN
INTRAMUSCULAR | Status: AC
  Filled 2018-05-17: qty 2

## 2018-05-17 MED ORDER — LIDOCAINE-EPINEPHRINE 1 %-1:100000 IJ SOLN
INTRAMUSCULAR | Status: AC
  Filled 2018-05-17: qty 1

## 2018-05-17 MED ORDER — MIDAZOLAM HCL 10 MG/2ML IJ SOLN
INTRAMUSCULAR | Status: DC | PRN
  Administered 2018-05-17: 1 mg via INTRAVENOUS
  Administered 2018-05-17 (×2): 2 mg via INTRAVENOUS

## 2018-05-17 MED ORDER — EPINEPHRINE PF 1 MG/ML IJ SOLN
INTRAMUSCULAR | Status: DC | PRN
  Administered 2018-05-17: 20 mL via SUBCUTANEOUS

## 2018-05-17 SURGICAL SUPPLY — 2 items
LOOP REVEAL LINQSYS (Prosthesis & Implant Heart) ×2 IMPLANT
PACK LOOP INSERTION (CUSTOM PROCEDURE TRAY) ×2 IMPLANT

## 2018-05-17 NOTE — Progress Notes (Signed)
Preliminary results by tech - Carotid Duplex Completed. No evidence of stenosis in bilateral carotid arteries. Oda Cogan, BS, RDMS, RVT

## 2018-05-17 NOTE — Discharge Summary (Signed)
Physician Discharge Summary  Kristin Lowe PFX:[id number] DOB: [birth year] DOA: 05/15/2018  PCP: [name], MD  Admit date: 05/15/2018 Discharge date: 05/17/2018  Admitted From: Home  Disposition:  Home   Recommendations for Outpatient Follow-up:  1. Follow up with PCP in 1-2 weeks 2. Please follow up with Neurology in 6-8 weeks   Home Health: None  Equipment/Devices: Implantable loop recorder  Discharge Condition: Good CODE STATUS: FULL Diet recommendation: Cardiac  Brief/Interim Summary: Kristin Lowe is a 77 y.o. F with no significant PMHx who presents with transient word finding difficulty. Mid-day on the day of admission,she was talking to her neighbor and noticed that she was mixing up her wordsand also had slurred speech.This was preceded by a dull 5/10 headache above her left eye, noticed on awakening Saturday morning. No limb weakness, limb numbness or any other deficits endorsed by patient. Neurological deficits had resolved by the time of the triage assessment.    Cryptogenic strokes -MRI showed two small foci of acute/early subacute infarction in left posterior temporal lobe -Non-invasive angiography showed no significant intra or extracranial atherosclerosis and normal carotids -TTE and follow up TEE showed no cardiogenic source of embolism -Loop recorder was placed, patient has follow up with EP -Lipids ordered, 102: discharged on new atorvastatin 40 mg daily -Aspirin ordered at admission --> discharged on 81 mg daily + Plavix for three weeks, afterwards Plavix alone -Atrial fibrillation: not observed on telemetry -tPA not given because symptoms had resolved -Dysphagia screen ordered in ER -PT eval ordered: recommended no further PT follow up -Smoking cessation: N/A, non-smoker    Elevated blood pressure BP >160/100 at admission.  Normalized without antihypertensives.  Follow up with PCP.     Discharge Diagnoses:  Principal problem    Acute  cryptogenic stroke Active Problems:   Sinus bradycardia   Essential hypertension       Discharge Instructions  Discharge Instructions    Ambulatory referral to Neurology   Complete by:  As directed    An appointment is requested in approximately: 4 weeks   Care order/instruction   Complete by:  As directed    Remove bulky dressing on Friday Steristrips in place until seen  In office Keep wound dry for 48 h Wound check in office , to be scheduled prior to release   Diet - low sodium heart healthy   Complete by:  As directed    Discharge instructions   Complete by:  As directed    From Dr. Loleta Lowe: You were admitted for a small stroke. Because of the pattern of stroke on your MRI, we have a suspicion that this was caused by "emboli" or scattered debris from the heart.   Your TEE (the ultrasound on a scope) showed no signs of this, but in order to be certain you aren't having silent episodes of "Atrial fibrillation" that would cause it, Dr. Caryl Lowe has placed the loop recorder.  Like we talked about, you should take the following medicines: Clopidogrel/Plavix 75 mg daily Aspirin 81 mg (baby aspirin) daily  Atorvastatin/Lipitor 40 mg nightly  In three weeks, stop aspirin, but continue Plavix and Lipitor indefinitely or until instructed otherwise.  Follow up with Kristin Lowe in 1-2 weeks Follow up with [facility]in Lowe office (the Neurologist) in 6-8 weeks This latter referral has been made for you.  If you haven't heard from Kristin Lowe office [facility]) within 2 weeks, call Kristin Lowe to follow up  Follow up with Dr. Caryl Lowe however they instructed.  Increase activity slowly   Complete by:  As directed      Allergies as of 05/17/2018   No Known Allergies     Medication List    TAKE these medications   ALIVE ONCE DAILY WOMENS 50+ Tabs Take 1 tablet by mouth daily.   ICAPS AREDS 2 PO Take 1 capsule by mouth daily.   aspirin EC 81 MG tablet Take 81  mg by mouth daily.   atorvastatin 40 MG tablet Commonly known as:  LIPITOR Take 1 tablet (40 mg total) by mouth daily at 6 PM.   clopidogrel 75 MG tablet Commonly known as:  PLAVIX Take 1 tablet (75 mg total) by mouth daily.   diclofenac sodium 1 % Gel Commonly known as:  VOLTAREN Apply 2 g topically 4 (four) times daily as needed (knee pain).   Vitamin D 2000 units Caps Take 1 capsule by mouth daily.      Follow-up Information    [facility] Follow up on 05/25/2018.   Specialty:  Cardiology Why:  at 3:30PM Contact information: [address], [place] [phone]         No Known Allergies  Consultations:  Neurology  Cardiology/EP   Procedures/Studies: Ct Angio Head W Or Wo Contrast  Result Date: 05/16/2018 CLINICAL DATA:  Initial evaluation for slurred speech, now resolved. EXAM: CT ANGIOGRAPHY HEAD AND NECK TECHNIQUE: Multidetector CT imaging of the head and neck was performed using the standard protocol during bolus administration of intravenous contrast. Multiplanar CT image reconstructions and MIPs were obtained to evaluate the vascular anatomy. Carotid stenosis measurements (when applicable) are obtained utilizing NASCET criteria, using the distal internal carotid diameter as the denominator. CONTRAST:  78mL ISOVUE-370 IOPAMIDOL (ISOVUE-370) INJECTION 76% COMPARISON:  Prior CT from 05/15/2018 FINDINGS: CTA NECK FINDINGS Aortic arch: Visualized aortic arch of normal caliber with normal 3 vessel morphology. No flow-limiting stenosis about the origin of the great vessels. Mild atheromatous plaque within the proximal descending intrathoracic aorta. Visualized subclavian arteries widely patent. Right carotid system: Right common and internal carotid arteries widely patent without stenosis, dissection, or occlusion. No atheromatous narrowing about the right carotid bifurcation. Left carotid system: Left common and  internal carotid arteries are widely patent without stenosis, dissection, or occlusion. No atheromatous narrowing about the left carotid bifurcation. Vertebral arteries: Both of the vertebral arteries arise from the subclavian arteries. Left vertebral artery dominant. Right vertebral artery diffusely hypoplastic. Vertebral arteries patent within the neck without stenosis, dissection, or occlusion. Skeleton: No acute osseous abnormality. No discrete osseous lesions. Moderate spondylolysis at C4-5 through C6-7. Other neck: Soft tissues of the neck demonstrate no acute finding. Upper chest: Visualized upper chest within normal limits. Partially visualized lungs are grossly clear. Review of the MIP images confirms the above findings CTA HEAD FINDINGS Anterior circulation: Internal carotid arteries widely patent to the termini without stenosis. A1 segments, anterior communicating artery common anterior cerebral arteries widely patent bilaterally. No M1 stenosis or occlusion. Distal MCA branches well perfused and symmetric. Posterior circulation: Dominant left vertebral artery widely patent to the vertebrobasilar junction. Hypoplastic right vertebral artery essentially terminates at the skull base. Left PICA patent. Right PICA not visualized. Dominant right AICA. Basilar widely patent to its distal aspect. Superior cerebral arteries patent bilaterally. Predominant fetal type origin of the PCAs supplied via widely patent posterior communicating arteries. PCAs widely patent to their distal aspects. Venous sinuses: Patent. Anatomic variants: Fetal type origin of the PCAs with diminutive  vertebrobasilar system. Delayed phase: No abnormal enhancement. Review of the MIP images confirms the above findings IMPRESSION: Normal CTA of the head and neck. No large vessel occlusion. No high-grade or correctable stenosis Electronically Signed   By: Jeannine Boga M.D.   On: 05/16/2018 04:02   Ct Head Wo Contrast  Result Date:  05/15/2018 CLINICAL DATA:  Headache EXAM: CT HEAD WITHOUT CONTRAST TECHNIQUE: Contiguous axial images were obtained from the base of the skull through the vertex without intravenous contrast. COMPARISON:  None. FINDINGS: Brain: No acute territorial infarction, intracranial hemorrhage, or mass lesion is visualized. The ventricles are nonenlarged. Left sub insular hypodensity. Vascular: No hyperdense vessels.  Carotid vascular calcification. Skull: Normal. Negative for fracture or focal lesion. Sinuses/Orbits: Mucosal thickening in the ethmoid and maxillary sinuses. Other: None IMPRESSION: 1. Negative for large vessel territorial infarct, hemorrhage or mass. 2. Small focal hypodensity in the left sub insular region, possible age indeterminate lacunar infarct. Electronically Signed   By: Donavan Foil M.D.   On: 05/15/2018 18:41   Ct Angio Neck W Or Wo Contrast  Result Date: 05/16/2018 CLINICAL DATA:  Initial evaluation for slurred speech, now resolved. EXAM: CT ANGIOGRAPHY HEAD AND NECK TECHNIQUE: Multidetector CT imaging of the head and neck was performed using the standard protocol during bolus administration of intravenous contrast. Multiplanar CT image reconstructions and MIPs were obtained to evaluate the vascular anatomy. Carotid stenosis measurements (when applicable) are obtained utilizing NASCET criteria, using the distal internal carotid diameter as the denominator. CONTRAST:  69mL ISOVUE-370 IOPAMIDOL (ISOVUE-370) INJECTION 76% COMPARISON:  Prior CT from 05/15/2018 FINDINGS: CTA NECK FINDINGS Aortic arch: Visualized aortic arch of normal caliber with normal 3 vessel morphology. No flow-limiting stenosis about the origin of the great vessels. Mild atheromatous plaque within the proximal descending intrathoracic aorta. Visualized subclavian arteries widely patent. Right carotid system: Right common and internal carotid arteries widely patent without stenosis, dissection, or occlusion. No atheromatous  narrowing about the right carotid bifurcation. Left carotid system: Left common and internal carotid arteries are widely patent without stenosis, dissection, or occlusion. No atheromatous narrowing about the left carotid bifurcation. Vertebral arteries: Both of the vertebral arteries arise from the subclavian arteries. Left vertebral artery dominant. Right vertebral artery diffusely hypoplastic. Vertebral arteries patent within the neck without stenosis, dissection, or occlusion. Skeleton: No acute osseous abnormality. No discrete osseous lesions. Moderate spondylolysis at C4-5 through C6-7. Other neck: Soft tissues of the neck demonstrate no acute finding. Upper chest: Visualized upper chest within normal limits. Partially visualized lungs are grossly clear. Review of the MIP images confirms the above findings CTA HEAD FINDINGS Anterior circulation: Internal carotid arteries widely patent to the termini without stenosis. A1 segments, anterior communicating artery common anterior cerebral arteries widely patent bilaterally. No M1 stenosis or occlusion. Distal MCA branches well perfused and symmetric. Posterior circulation: Dominant left vertebral artery widely patent to the vertebrobasilar junction. Hypoplastic right vertebral artery essentially terminates at the skull base. Left PICA patent. Right PICA not visualized. Dominant right AICA. Basilar widely patent to its distal aspect. Superior cerebral arteries patent bilaterally. Predominant fetal type origin of the PCAs supplied via widely patent posterior communicating arteries. PCAs widely patent to their distal aspects. Venous sinuses: Patent. Anatomic variants: Fetal type origin of the PCAs with diminutive vertebrobasilar system. Delayed phase: No abnormal enhancement. Review of the MIP images confirms the above findings IMPRESSION: Normal CTA of the head and neck. No large vessel occlusion. No high-grade or correctable stenosis Electronically Signed   By:  Jeannine Boga M.D.   On: 05/16/2018 04:02   Mr Brain Wo Contrast  Result Date: 05/16/2018 CLINICAL DATA:  77 y/o F; episode of slurred speech and difficulty speaking. TIA, initial exam. EXAM: MRI HEAD WITHOUT CONTRAST TECHNIQUE: Multiplanar, multiecho pulse sequences of the brain and surrounding structures were obtained without intravenous contrast. COMPARISON:  05/15/2018 CT head.  05/16/2018 CTA head. FINDINGS: Brain: 2 punctate foci of reduced diffusion are present within the left posterior temporal lobe compatible with acute/early subacute infarction. No associated hemorrhage or mass effect. Scattered nonspecific foci of T2 FLAIR hyperintense signal abnormality in subcortical and periventricular white matter are compatible with mild chronic microvascular ischemic changes for age. Mild brain parenchymal volume loss. No abnormal susceptibility hypointensity to indicate intracranial hemorrhage. No extra-axial collection, hydrocephalus, or herniation. 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 Vascular: Normal flow voids. Skull and upper cervical spine: Normal marrow signal. Sinuses/Orbits: Negative. Other: None. IMPRESSION: 1. Two punctate foci of acute/early subacute infarction are present within the left posterior temporal lobe. No associated hemorrhage or mass effect. 2. Mild for age chronic microvascular ischemic changes and parenchymal volume loss of the brain. These results will be called to the ordering clinician or representative by the Radiologist Assistant, and communication documented in the PACS or zVision Dashboard. Electronically Signed   By: Kristine Garbe M.D.   On: 05/16/2018 21:10  Echocardiogram Study Conclusions  - Left ventricle: The cavity size was normal. Systolic function was   normal. The estimated ejection fraction was in the range of 55%   to 60%. Wall motion was normal; there were no regional wall   motion abnormalities. - Mitral  valve: There was mild regurgitation.  Impressions:  - No cardiac source of emboli was indentified.   Carotid US Final Interpretation: Right Carotid: Velocities in the right ICA are consistent with a 1-39% stenosis.  Left Carotid: Velocities in the left ICA are consistent with a 1-39% stenosis.  Vertebrals: Bilateral vertebral arteries demonstrate antegrade flow.    TEE Study Conclusions  - Left atrium: No evidence of thrombus in the atrial cavity or   appendage.    Subjective: Feeling well today.  No more speech disturbance, no focal weakness, numbness, confusion, fever, vomiting, weakness.  Discharge Exam: Vitals:   05/17/18 1540 05/17/18 1721  BP: (!) 116/49 92/77  Pulse: (!) 45 68  Resp: 16 18  Temp:  98.1 F (36.7 C)  SpO2: 98% 100%   Vitals:   05/17/18 1520 05/17/18 1530 05/17/18 1540 05/17/18 1721  BP: (!) 126/40 (!) 106/54 (!) 116/49 92/77  Pulse: (!) 44 (!) 44 (!) 45 68  Resp: 17 16 16 18   Temp:    98.1 F (36.7 C)  TempSrc:    Oral  SpO2: 99% 99% 98% 100%  Weight:      Height:        General: Pt is alert, awake, not in acute distress, sitting on side of bed Cardiovascular: Slow regular, S1/S2 +, no rubs, no gallops Respiratory: CTA bilaterally, no wheezing, no rhonchi Abdominal: Soft, NT, ND, bowel sounds + Extremities: no edema, no cyanosis    The results of significant diagnostics from this hospitalization (including imaging, microbiology, ancillary and laboratory) are listed below for reference.     Microbiology: No results found for this or any previous visit (from the past 240 hour(s)).   Labs: BNP (last 3 results) No results for input(s): BNP in the last 8760 hours. Basic Metabolic Panel: Recent Labs  Lab 05/15/18 1649 05/15/18 1655 05/16/18 0849  NA 140 141 141  K 3.7 3.6 3.8  CL 107 104 107  CO2 27  --  26  GLUCOSE 154* 148* 98  BUN 11 13 7   CREATININE 0.76 0.80 0.81  CALCIUM 10.3  --  10.0   Liver Function  Tests: Recent Labs  Lab 05/15/18 1649  AST 28  ALT 25  ALKPHOS 65  BILITOT 0.4  PROT 7.2  ALBUMIN 4.2   No results for input(s): LIPASE, AMYLASE in the last 168 hours. No results for input(s): AMMONIA in the last 168 hours. CBC: Recent Labs  Lab 05/15/18 1649 05/15/18 1655 05/16/18 0849  WBC 7.0  --  5.7  NEUTROABS 4.8  --   --   HGB 13.1 13.3 13.3  HCT 41.8 39.0 42.0  MCV 94.4  --  93.5  PLT 276  --  288   Cardiac Enzymes: Recent Labs  Lab 05/16/18 0849  CKTOTAL 153   BNP: Invalid input(s): POCBNP CBG: Recent Labs  Lab 05/16/18 1147 05/16/18 1648 05/17/18 0701 05/17/18 0906 05/17/18 1150  GLUCAP 93 94 83 127* 82   D-Dimer No results for input(s): DDIMER in the last 72 hours. Hgb A1c Recent Labs    05/15/18 2119  HGBA1C 5.5   Lipid Profile Recent Labs    05/15/18 2119  CHOL 182  HDL 53  LDLCALC 106*  TRIG 115  CHOLHDL 3.4   Thyroid function studies No results for input(s): TSH, T4TOTAL, T3FREE, THYROIDAB in the last 72 hours.  Invalid input(s): FREET3 Anemia work up No results for input(s): VITAMINB12, FOLATE, FERRITIN, TIBC, IRON, RETICCTPCT in the last 72 hours. Urinalysis No results found for: COLORURINE, APPEARANCEUR, LABSPEC, [place], GLUCOSEU, HGBUR, BILIRUBINUR, KETONESUR, PROTEINUR, UROBILINOGEN, NITRITE, LEUKOCYTESUR Sepsis Labs Invalid input(s): PROCALCITONIN,  WBC,  LACTICIDVEN Microbiology No results found for this or any previous visit (from the past 240 hour(s)).   Time coordinating discharge: 30 minutes        SIGNED:   Edwin Dada, MD  [facility] 05/17/2018, 5:50 PM

## 2018-05-17 NOTE — Interval H&P Note (Signed)
History and Physical Interval Note:  05/17/2018 1:50 PM  Kristin Lowe  has presented today for surgery, with the diagnosis of stroke  The various methods of treatment have been discussed with the patient and family. After consideration of risks, benefits and other options for treatment, the patient has consented to  Procedure(s): TRANSESOPHAGEAL ECHOCARDIOGRAM (TEE) (N/A) as a surgical intervention .  The patient's history has been reviewed, patient examined, no change in status, stable for surgery.  I have reviewed the patient's chart and labs.  Questions were answered to the patient's satisfaction.     Dorris Carnes

## 2018-05-17 NOTE — Progress Notes (Signed)
STROKE TEAM PROGRESS NOTE      SUBJECTIVE (INTERVAL HISTORY) No family members are present.  The patient has had no further symptoms. MRI scan of the brain personally reviewed shows 2 punctate areas of acute to early subacute infarcts involving left posterior temporal cortex and subcortical regions.   OBJECTIVE Temp:  [97.6 F (36.4 C)-98.2 F (36.8 C)] 97.9 F (36.6 C) (06/24 0909) Pulse Rate:  [45-58] 58 (06/24 0909) Cardiac Rhythm: Sinus bradycardia (06/24 0800) Resp:  [16-19] 18 (06/24 0909) BP: (105-144)/(55-90) 138/72 (06/24 0909) SpO2:  [97 %-99 %] 98 % (06/24 0909)  CBC:  Recent Labs  Lab 05/15/18 1649 05/15/18 1655 05/16/18 0849  WBC 7.0  --  5.7  NEUTROABS 4.8  --   --   HGB 13.1 13.3 13.3  HCT 41.8 39.0 42.0  MCV 94.4  --  93.5  PLT 276  --  496    Basic Metabolic Panel:  Recent Labs  Lab 05/15/18 1649 05/15/18 1655 05/16/18 0849  NA 140 141 141  K 3.7 3.6 3.8  CL 107 104 107  CO2 27  --  26  GLUCOSE 154* 148* 98  BUN 11 13 7   CREATININE 0.76 0.80 0.81  CALCIUM 10.3  --  10.0    Lipid Panel:     Component Value Date/Time   CHOL 182 05/15/2018 2119   TRIG 115 05/15/2018 2119   HDL 53 05/15/2018 2119   CHOLHDL 3.4 05/15/2018 2119   VLDL 23 05/15/2018 2119   LDLCALC 106 (H) 05/15/2018 2119   HgbA1c:  Lab Results  Component Value Date   HGBA1C 5.5 05/15/2018   Urine Drug Screen: No results found for: LABOPIA, COCAINSCRNUR, LABBENZ, AMPHETMU, THCU, LABBARB  Alcohol Level No results found for: ETH  IMAGING   Ct Angio Head W Or Wo Contrast Ct Angio Neck W Or Wo Contrast 05/16/2018 IMPRESSION:  Normal CTA of the head and neck. No large vessel occlusion. No high-grade or correctable stenosis    Ct Head Wo Contrast  05/15/2018 IMPRESSION:  1. Negative for large vessel territorial infarct, hemorrhage or mass.  2. Small focal hypodensity in the left sub insular region, possible age indeterminate lacunar infarct.    MR Brain Wo Contrast  - Two punctate foci of acute/early subacute infarction are present within the left posterior temporal lobe. No associated hemorrhage or mass effect    Transthoracic Echocardiogram -Left ventricle: The cavity size was normal. Systolic function was   normal. The estimated ejection fraction was in the range of 55%   to 60%. Wall motion was normal; there were no regional wall   motion abnormalities.    PHYSICAL EXAM Vitals:   05/16/18 1945 05/17/18 0107 05/17/18 0428 05/17/18 0909  BP: 133/90 (!) 109/55 (!) 105/56 138/72  Pulse: (!) 53 (!) 45 (!) 45 (!) 58  Resp: 19   18  Temp: 98 F (36.7 C) 98.2 F (36.8 C) 98.1 F (36.7 C) 97.9 F (36.6 C)  TempSrc: Oral Oral Oral Oral  SpO2: 99% 98% 97% 98%  Weight:      Height:       Pleasant obese elderly  Caucasian lady not in distress. . Afebrile. Head is nontraumatic. Neck is supple without bruit.    Cardiac exam no murmur or gallop. Lungs are clear to auscultation. Distal pulses are well felt.  Neurological Exam ;  Awake  Alert oriented x 3. Normal speech and language.eye movements full without nystagmus.fundi were not visualized. Vision acuity and fields appear  normal. Hearing is normal. Palatal movements are normal. Face symmetric. Tongue midline. Normal strength, tone, reflexes and coordination. Normal sensation. Gait deferred.         ASSESSMENT/PLAN [name] is a 77 y.o. female with history of hypertension and bradycardia presenting with transient headache and speech difficulties. She did not receive IV t-PA due to resolution of deficits.  Possible left hemispheric TIA:    Resultant - resolution of deficits  CT head -  Small focal hypodensity in the left sub insular region, possible age indeterminate lacunar infarct.   MRI head - 2 punctate areas of acute infarcts in the left posterior temporal lobe  MRA head - not performed  CTA H&N - negative  Carotid Doppler - CTA H&N  2D Echo - normal EF. No  clot  LDL - 106  HgbA1c - 5.5  VTE prophylaxis -Lovenox Diet Order           Diet NPO time specified  Diet effective now          No antithrombotic prior to admission, now on aspirin 81 mg daily  Patient counseled to be compliant with her antithrombotic medications  Ongoing aggressive stroke risk factor management  Therapy recommendations: Patient screened.  No needs identified.  Disposition:  Pending  Hypertension  Blood pressure tends to run mildly low . Permissive hypertension (OK if < 220/120) but gradually normalize in 5-7 days . Long-term BP goal normotensive  Hyperlipidemia  Lipid lowering medication PTA: None  LDL 106, goal < 70  Current lipid lowering medication: Lipitor 40 mg daily  Continue statin at discharge   Other Stroke Risk Factors  Advanced age  Former cigarette smoker - quit  Obesity, Body mass index is 29.9 kg/m., recommend weight loss, diet and exercise as appropriate    Other Active Problems  Bradycardia   Plan / Recommendations   Await TEE and loop recorder.  Continue aspirin 81 mg daily10 Plavix 75 mg daily for 3 weeks followed by Plavix alone  Continue Lipitor 40 mg daily  Patient may consider possible participation in the [place]  cryptogenic stroke trial if interested in the future   Hospital day # 0   I have personally examined this patient, reviewed notes, independently viewed imaging studies, participated in medical decision making and plan of care.ROS completed by me personally and pertinent positives fully documented  I have made any additions or clarifications directly to the above note. .  She presented with transient episode of speech difficulties possibly a left hemispheric TIA.  MRI is pending.  Recommend  dual antiplatelet therapy.  Added statin.  Aggressive risk factor modification. Check TEE and loop recorder Long discussion with patient, family and with Dr. Loleta Books about her presentation and plan for  evaluation and treatment and answered questions.  Greater than 50% time during this 25-minute visit was spent on counseling and coordination of care about her TIA and stroke risk and planning treatment. Antony Contras, MD Medical Director [facility] Pager: [phone] 05/17/2018 12:38 PM   To contact Stroke Continuity provider, please refer to [email or web address] After hours, contact General Neurology

## 2018-05-17 NOTE — Progress Notes (Signed)
  Echocardiogram Echocardiogram Transesophageal has been performed.  Randa Lynn Tyneshia Stivers 05/17/2018, 3:21 PM

## 2018-05-17 NOTE — H&P (View-Only) (Signed)
STROKE TEAM PROGRESS NOTE      SUBJECTIVE (INTERVAL HISTORY) No family members are present.  The patient has had no further symptoms. MRI scan of the brain personally reviewed shows 2 punctate areas of acute to early subacute infarcts involving left posterior temporal cortex and subcortical regions.   OBJECTIVE Temp:  [97.6 F (36.4 C)-98.2 F (36.8 C)] 97.9 F (36.6 C) (06/24 0909) Pulse Rate:  [45-58] 58 (06/24 0909) Cardiac Rhythm: Sinus bradycardia (06/24 0800) Resp:  [16-19] 18 (06/24 0909) BP: (105-144)/(55-90) 138/72 (06/24 0909) SpO2:  [97 %-99 %] 98 % (06/24 0909)  CBC:  Recent Labs  Lab 05/15/18 1649 05/15/18 1655 05/16/18 0849  WBC 7.0  --  5.7  NEUTROABS 4.8  --   --   HGB 13.1 13.3 13.3  HCT 41.8 39.0 42.0  MCV 94.4  --  93.5  PLT 276  --  841    Basic Metabolic Panel:  Recent Labs  Lab 05/15/18 1649 05/15/18 1655 05/16/18 0849  NA 140 141 141  K 3.7 3.6 3.8  CL 107 104 107  CO2 27  --  26  GLUCOSE 154* 148* 98  BUN 11 13 7   CREATININE 0.76 0.80 0.81  CALCIUM 10.3  --  10.0    Lipid Panel:     Component Value Date/Time   CHOL 182 05/15/2018 2119   TRIG 115 05/15/2018 2119   HDL 53 05/15/2018 2119   CHOLHDL 3.4 05/15/2018 2119   VLDL 23 05/15/2018 2119   LDLCALC 106 (H) 05/15/2018 2119   HgbA1c:  Lab Results  Component Value Date   HGBA1C 5.5 05/15/2018   Urine Drug Screen: No results found for: LABOPIA, COCAINSCRNUR, LABBENZ, AMPHETMU, THCU, LABBARB  Alcohol Level No results found for: ETH  IMAGING   Ct Angio Head W Or Wo Contrast Ct Angio Neck W Or Wo Contrast 05/16/2018 IMPRESSION:  Normal CTA of the head and neck. No large vessel occlusion. No high-grade or correctable stenosis    Ct Head Wo Contrast  05/15/2018 IMPRESSION:  1. Negative for large vessel territorial infarct, hemorrhage or mass.  2. Small focal hypodensity in the left sub insular region, possible age indeterminate lacunar infarct.    MR Brain Wo Contrast  - Two punctate foci of acute/early subacute infarction are present within the left posterior temporal lobe. No associated hemorrhage or mass effect    Transthoracic Echocardiogram -Left ventricle: The cavity size was normal. Systolic function was   normal. The estimated ejection fraction was in the range of 55%   to 60%. Wall motion was normal; there were no regional wall   motion abnormalities.    PHYSICAL EXAM Vitals:   05/16/18 1945 05/17/18 0107 05/17/18 0428 05/17/18 0909  BP: 133/90 (!) 109/55 (!) 105/56 138/72  Pulse: (!) 53 (!) 45 (!) 45 (!) 58  Resp: 19   18  Temp: 98 F (36.7 C) 98.2 F (36.8 C) 98.1 F (36.7 C) 97.9 F (36.6 C)  TempSrc: Oral Oral Oral Oral  SpO2: 99% 98% 97% 98%  Weight:      Height:       Pleasant obese elderly  Caucasian lady not in distress. . Afebrile. Head is nontraumatic. Neck is supple without bruit.    Cardiac exam no murmur or gallop. Lungs are clear to auscultation. Distal pulses are well felt.  Neurological Exam ;  Awake  Alert oriented x 3. Normal speech and language.eye movements full without nystagmus.fundi were not visualized. Vision acuity and fields appear  normal. Hearing is normal. Palatal movements are normal. Face symmetric. Tongue midline. Normal strength, tone, reflexes and coordination. Normal sensation. Gait deferred.         ASSESSMENT/PLAN [name] is a 77 y.o. female with history of hypertension and bradycardia presenting with transient headache and speech difficulties. She did not receive IV t-PA due to resolution of deficits.  Possible left hemispheric TIA:    Resultant - resolution of deficits  CT head -  Small focal hypodensity in the left sub insular region, possible age indeterminate lacunar infarct.   MRI head - 2 punctate areas of acute infarcts in the left posterior temporal lobe  MRA head - not performed  CTA H&N - negative  Carotid Doppler - CTA H&N  2D Echo - normal EF. No  clot  LDL - 106  HgbA1c - 5.5  VTE prophylaxis -Lovenox Diet Order           Diet NPO time specified  Diet effective now          No antithrombotic prior to admission, now on aspirin 81 mg daily  Patient counseled to be compliant with her antithrombotic medications  Ongoing aggressive stroke risk factor management  Therapy recommendations: Patient screened.  No needs identified.  Disposition:  Pending  Hypertension  Blood pressure tends to run mildly low . Permissive hypertension (OK if < 220/120) but gradually normalize in 5-7 days . Long-term BP goal normotensive  Hyperlipidemia  Lipid lowering medication PTA: None  LDL 106, goal < 70  Current lipid lowering medication: Lipitor 40 mg daily  Continue statin at discharge   Other Stroke Risk Factors  Advanced age  Former cigarette smoker - quit  Obesity, Body mass index is 29.9 kg/m., recommend weight loss, diet and exercise as appropriate    Other Active Problems  Bradycardia   Plan / Recommendations   Await TEE and loop recorder.  Continue aspirin 81 mg daily10 Plavix 75 mg daily for 3 weeks followed by Plavix alone  Continue Lipitor 40 mg daily  Patient may consider possible participation in the [facility]  cryptogenic stroke trial if interested in the future   Hospital day # 0   I have personally examined this patient, reviewed notes, independently viewed imaging studies, participated in medical decision making and plan of care.ROS completed by me personally and pertinent positives fully documented  I have made any additions or clarifications directly to the above note. .  She presented with transient episode of speech difficulties possibly a left hemispheric TIA.  MRI is pending.  Recommend  dual antiplatelet therapy.  Added statin.  Aggressive risk factor modification. Check TEE and loop recorder Long discussion with patient, family and with Dr. Loleta Books about her presentation and plan for  evaluation and treatment and answered questions.  Greater than 50% time during this 25-minute visit was spent on counseling and coordination of care about her TIA and stroke risk and planning treatment. Antony Contras, MD Medical Director [facility] Pager: [phone] 05/17/2018 12:38 PM   To contact Stroke Continuity provider, please refer to [email or web address] After hours, contact General Neurology

## 2018-05-17 NOTE — Progress Notes (Signed)
Pt transported off unit via bed to Endo for procedure. Delia Heady RN

## 2018-05-17 NOTE — Progress Notes (Signed)
   05/17/18 1721  Vitals  Temp 98.1 F (36.7 C)  Temp Source Oral  BP 92/77  MAP (mmHg) 84  BP Location Left Arm  BP Method Automatic  Patient Position (if appropriate) Lying  Pulse Rate 68  Pulse Rate Source Dinamap  Resp 18  Oxygen Therapy  SpO2 100 %  O2 Device Room Air   Pt back to room from procedure. Pt denies any pain or discomfort. Left chest small incision dsg stained with small blood; marked for monitoring. Pt sitting up in bed with call light within reach and family at bedside. Will continue to closely monitor. Delia Heady RN

## 2018-05-17 NOTE — Interval H&P Note (Signed)
History and Physical Interval Note:  05/17/2018 2:00 PM  Kristin Lowe  has presented today for surgery, with the diagnosis of stroke  The various methods of treatment have been discussed with the patient and family. After consideration of risks, benefits and other options for treatment, the patient has consented to  Procedure(s): TRANSESOPHAGEAL ECHOCARDIOGRAM (TEE) (N/A) as a surgical intervention .  The patient's history has been reviewed, patient examined, no change in status, stable for surgery.  I have reviewed the patient's chart and labs.  Questions were answered to the patient's satisfaction.     Dorris Carnes

## 2018-05-17 NOTE — Progress Notes (Signed)
Pt discharge education and instructions completed with pt and family at bedside; all voices understanding and denies any questions. Pt IV and telemetry removed; pt to pick up electronically sent prescriptions from preferred pharmacy on file. Pt chest incision dsg remains unchanged. Pt discharge home with family to transport her home. Pt transported off unit via wheelchair with belongings and family to the side. Kristin Heady RN

## 2018-05-17 NOTE — CV Procedure (Signed)
TEE  LA, LA appendage without masses NO PFO as tested by color doppler or with injection of agitated saline MV with mild bileaflet prolapse   Mild MR TV normal   NO signif TR AV normal   NO AI PV normal  LVEF normal  MIld fixed plaquing of the thoracic aorta.

## 2018-05-17 NOTE — Consult Note (Addendum)
ELECTROPHYSIOLOGY CONSULT NOTE  Patient ID: Kristin Lowe MRN: [id number], DOB/AGE: [birth year]   Admit date: 05/15/2018 Date of Consult: 05/17/2018  Primary Physician: [name], MD Primary Cardiologist: new to [facility] Reason for Consultation: Cryptogenic stroke; recommendations regarding Implantable Loop Recorder  History of Present Illness EP has been asked to evaluate Kristin Lowe for placement of an implantable loop recorder to monitor for atrial fibrillation by Dr Leonie Man.  The patient was admitted on 05/15/2018 with expressive aphasai.  Imaging demonstrated 2 punctate foci of acute and early subacute infarction within the left posterior temporal lobe.  she has undergone workup for stroke including echocardiogram and carotid dopplers.  The patient has been monitored on telemetry which has demonstrated sinus rhythm with no arrhythmias.  Inpatient stroke work-up is to be completed with a TEE.   Echocardiogram this admission demonstrated EF 55-60%, no RWMA, LA 32.  Lab work is reviewed.  Prior to admission, the patient denies chest pain, shortness of breath, dizziness, palpitations, or syncope.  They are recovering from their stroke with plans to return home at discharge.   Past Medical History:  Diagnosis Date  . Bradycardia   . Hypertension      Surgical History:  Past Surgical History:  Procedure Laterality Date  . CATARACT EXTRACTION, BILATERAL    . TRIGGER FINGER RELEASE    . TUMOR REMOVAL       Medications Prior to Admission  Medication Sig Dispense Refill Last Dose  . aspirin EC 81 MG tablet Take 81 mg by mouth daily.   05/16/2018 at Unknown time  . Cholecalciferol (VITAMIN D) 2000 units CAPS Take 1 capsule by mouth daily.   05/15/2018 at Unknown time  . diclofenac sodium (VOLTAREN) 1 % GEL Apply 2 g topically 4 (four) times daily as needed (knee pain).   05/15/2018 at Unknown time  . Multiple Vitamins-Minerals (ALIVE ONCE DAILY WOMENS 50+) TABS Take 1 tablet by  mouth daily.   05/15/2018 at Unknown time  . Multiple Vitamins-Minerals (ICAPS AREDS 2 PO) Take 1 capsule by mouth daily.   05/15/2018 at Unknown time    Inpatient Medications:  . aspirin EC  325 mg Oral Daily  . atorvastatin  40 mg Oral q1800  . enoxaparin (LOVENOX) injection  40 mg Subcutaneous Q24H  . insulin aspart  0-9 Units Subcutaneous TID WC    Allergies: No Known Allergies  Social History   Socioeconomic History  . Marital status: Unknown    Spouse name: Not on file  . Number of children: Not on file  . Years of education: Not on file  . Highest education level: Not on file  Occupational History  . Not on file  Social Needs  . Financial resource strain: Not on file  . Food insecurity:    Worry: Not on file    Inability: Not on file  . Transportation needs:    Medical: Not on file    Non-medical: Not on file  Tobacco Use  . Smoking status: Former Research scientist (life sciences)  . Smokeless tobacco: Never Used  Substance and Sexual Activity  . Alcohol use: No  . Drug use: Not on file  . Sexual activity: Not on file  Lifestyle  . Physical activity:    Days per week: Not on file    Minutes per session: Not on file  . Stress: Not on file  Relationships  . Social connections:    Talks on phone: Not on file    Gets together: Not on file  Attends religious service: Not on file    Active member of club or organization: Not on file    Attends meetings of clubs or organizations: Not on file    Relationship status: Not on file  . Intimate partner violence:    Fear of current or ex partner: Not on file    Emotionally abused: Not on file    Physically abused: Not on file    Forced sexual activity: Not on file  Other Topics Concern  . Not on file  Social History Narrative  . Not on file     Family History  Problem Relation Age of Onset  . Breast cancer Mother   . Diabetes Mother   . Thyroid disease Mother   . Heart disease Father   . Hypertension Sister   . Thyroid disease  Sister   . Diabetes Brother   . Hypertension Brother       Review of Systems: All other systems reviewed and are otherwise negative except as noted above.  Physical Exam: Vitals:   05/16/18 1945 05/17/18 0107 05/17/18 0428 05/17/18 0909  BP: 133/90 (!) 109/55 (!) 105/56 138/72  Pulse: (!) 53 (!) 45 (!) 45 (!) 58  Resp: 19   18  Temp: 98 F (36.7 C) 98.2 F (36.8 C) 98.1 F (36.7 C) 97.9 F (36.6 C)  TempSrc: Oral Oral Oral Oral  SpO2: 99% 98% 97% 98%  Weight:      Height:        GEN- The patient is well appearing, alert and oriented x 3 today.   Head- normocephalic, atraumatic Eyes-  Sclera clear, conjunctiva pink Ears- hearing intact Oropharynx- clear Neck- supple Lungs- Clear to ausculation bilaterally, normal work of breathing Heart- Regular rate and rhythm  GI- soft, NT, ND, + BS Extremities- no clubbing, cyanosis, or edema MS- no significant deformity or atrophy Skin- no rash or lesion Psych- euthymic mood, full affect   Labs:   Lab Results  Component Value Date   WBC 5.7 05/16/2018   HGB 13.3 05/16/2018   HCT 42.0 05/16/2018   MCV 93.5 05/16/2018   PLT 288 05/16/2018    Recent Labs  Lab 05/15/18 1649  05/16/18 0849  NA 140   < > 141  K 3.7   < > 3.8  CL 107   < > 107  CO2 27  --  26  BUN 11   < > 7  CREATININE 0.76   < > 0.81  CALCIUM 10.3  --  10.0  PROT 7.2  --   --   BILITOT 0.4  --   --   ALKPHOS 65  --   --   ALT 25  --   --   AST 28  --   --   GLUCOSE 154*   < > 98   < > = values in this interval not displayed.     Radiology/Studies: Ct Angio Head W Or Wo Contrast  Result Date: 05/16/2018 CLINICAL DATA:  Initial evaluation for slurred speech, now resolved. EXAM: CT ANGIOGRAPHY HEAD AND NECK TECHNIQUE: Multidetector CT imaging of the head and neck was performed using the standard protocol during bolus administration of intravenous contrast. Multiplanar CT image reconstructions and MIPs were obtained to evaluate the vascular anatomy.  Carotid stenosis measurements (when applicable) are obtained utilizing NASCET criteria, using the distal internal carotid diameter as the denominator. CONTRAST:  32mL ISOVUE-370 IOPAMIDOL (ISOVUE-370) INJECTION 76% COMPARISON:  Prior CT from 05/15/2018 FINDINGS: CTA NECK  FINDINGS Aortic arch: Visualized aortic arch of normal caliber with normal 3 vessel morphology. No flow-limiting stenosis about the origin of the great vessels. Mild atheromatous plaque within the proximal descending intrathoracic aorta. Visualized subclavian arteries widely patent. Right carotid system: Right common and internal carotid arteries widely patent without stenosis, dissection, or occlusion. No atheromatous narrowing about the right carotid bifurcation. Left carotid system: Left common and internal carotid arteries are widely patent without stenosis, dissection, or occlusion. No atheromatous narrowing about the left carotid bifurcation. Vertebral arteries: Both of the vertebral arteries arise from the subclavian arteries. Left vertebral artery dominant. Right vertebral artery diffusely hypoplastic. Vertebral arteries patent within the neck without stenosis, dissection, or occlusion. Skeleton: No acute osseous abnormality. No discrete osseous lesions. Moderate spondylolysis at C4-5 through C6-7. Other neck: Soft tissues of the neck demonstrate no acute finding. Upper chest: Visualized upper chest within normal limits. Partially visualized lungs are grossly clear. Review of the MIP images confirms the above findings CTA HEAD FINDINGS Anterior circulation: Internal carotid arteries widely patent to the termini without stenosis. A1 segments, anterior communicating artery common anterior cerebral arteries widely patent bilaterally. No M1 stenosis or occlusion. Distal MCA branches well perfused and symmetric. Posterior circulation: Dominant left vertebral artery widely patent to the vertebrobasilar junction. Hypoplastic right vertebral artery  essentially terminates at the skull base. Left PICA patent. Right PICA not visualized. Dominant right AICA. Basilar widely patent to its distal aspect. Superior cerebral arteries patent bilaterally. Predominant fetal type origin of the PCAs supplied via widely patent posterior communicating arteries. PCAs widely patent to their distal aspects. Venous sinuses: Patent. Anatomic variants: Fetal type origin of the PCAs with diminutive vertebrobasilar system. Delayed phase: No abnormal enhancement. Review of the MIP images confirms the above findings IMPRESSION: Normal CTA of the head and neck. No large vessel occlusion. No high-grade or correctable stenosis Electronically Signed   By: Jeannine Boga M.D.   On: 05/16/2018 04:02   Ct Head Wo Contrast  Result Date: 05/15/2018 CLINICAL DATA:  Headache EXAM: CT HEAD WITHOUT CONTRAST TECHNIQUE: Contiguous axial images were obtained from the base of the skull through the vertex without intravenous contrast. COMPARISON:  None. FINDINGS: Brain: No acute territorial infarction, intracranial hemorrhage, or mass lesion is visualized. The ventricles are nonenlarged. Left sub insular hypodensity. Vascular: No hyperdense vessels.  Carotid vascular calcification. Skull: Normal. Negative for fracture or focal lesion. Sinuses/Orbits: Mucosal thickening in the ethmoid and maxillary sinuses. Other: None IMPRESSION: 1. Negative for large vessel territorial infarct, hemorrhage or mass. 2. Small focal hypodensity in the left sub insular region, possible age indeterminate lacunar infarct. Electronically Signed   By: Donavan Foil M.D.   On: 05/15/2018 18:41   Ct Angio Neck W Or Wo Contrast  Result Date: 05/16/2018 CLINICAL DATA:  Initial evaluation for slurred speech, now resolved. EXAM: CT ANGIOGRAPHY HEAD AND NECK TECHNIQUE: Multidetector CT imaging of the head and neck was performed using the standard protocol during bolus administration of intravenous contrast. Multiplanar CT  image reconstructions and MIPs were obtained to evaluate the vascular anatomy. Carotid stenosis measurements (when applicable) are obtained utilizing NASCET criteria, using the distal internal carotid diameter as the denominator. CONTRAST:  78mL ISOVUE-370 IOPAMIDOL (ISOVUE-370) INJECTION 76% COMPARISON:  Prior CT from 05/15/2018 FINDINGS: CTA NECK FINDINGS Aortic arch: Visualized aortic arch of normal caliber with normal 3 vessel morphology. No flow-limiting stenosis about the origin of the great vessels. Mild atheromatous plaque within the proximal descending intrathoracic aorta. Visualized subclavian arteries widely patent. Right  carotid system: Right common and internal carotid arteries widely patent without stenosis, dissection, or occlusion. No atheromatous narrowing about the right carotid bifurcation. Left carotid system: Left common and internal carotid arteries are widely patent without stenosis, dissection, or occlusion. No atheromatous narrowing about the left carotid bifurcation. Vertebral arteries: Both of the vertebral arteries arise from the subclavian arteries. Left vertebral artery dominant. Right vertebral artery diffusely hypoplastic. Vertebral arteries patent within the neck without stenosis, dissection, or occlusion. Skeleton: No acute osseous abnormality. No discrete osseous lesions. Moderate spondylolysis at C4-5 through C6-7. Other neck: Soft tissues of the neck demonstrate no acute finding. Upper chest: Visualized upper chest within normal limits. Partially visualized lungs are grossly clear. Review of the MIP images confirms the above findings CTA HEAD FINDINGS Anterior circulation: Internal carotid arteries widely patent to the termini without stenosis. A1 segments, anterior communicating artery common anterior cerebral arteries widely patent bilaterally. No M1 stenosis or occlusion. Distal MCA branches well perfused and symmetric. Posterior circulation: Dominant left vertebral artery  widely patent to the vertebrobasilar junction. Hypoplastic right vertebral artery essentially terminates at the skull base. Left PICA patent. Right PICA not visualized. Dominant right AICA. Basilar widely patent to its distal aspect. Superior cerebral arteries patent bilaterally. Predominant fetal type origin of the PCAs supplied via widely patent posterior communicating arteries. PCAs widely patent to their distal aspects. Venous sinuses: Patent. Anatomic variants: Fetal type origin of the PCAs with diminutive vertebrobasilar system. Delayed phase: No abnormal enhancement. Review of the MIP images confirms the above findings IMPRESSION: Normal CTA of the head and neck. No large vessel occlusion. No high-grade or correctable stenosis Electronically Signed   By: Jeannine Boga M.D.   On: 05/16/2018 04:02   Mr Brain Wo Contrast  Result Date: 05/16/2018 CLINICAL DATA:  77 y/o F; episode of slurred speech and difficulty speaking. TIA, initial exam. EXAM: MRI HEAD WITHOUT CONTRAST TECHNIQUE: Multiplanar, multiecho pulse sequences of the brain and surrounding structures were obtained without intravenous contrast. COMPARISON:  05/15/2018 CT head.  05/16/2018 CTA head. FINDINGS: Brain: 2 punctate foci of reduced diffusion are present within the left posterior temporal lobe compatible with acute/early subacute infarction. No associated hemorrhage or mass effect. Scattered nonspecific foci of T2 FLAIR hyperintense signal abnormality in subcortical and periventricular white matter are compatible with mild chronic microvascular ischemic changes for age. Mild brain parenchymal volume loss. No abnormal susceptibility hypointensity to indicate intracranial hemorrhage. No extra-axial collection, hydrocephalus, or herniation. 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 Vascular: Normal flow voids. Skull and upper cervical spine: Normal marrow signal. Sinuses/Orbits: Negative. Other: None. IMPRESSION:  1. Two punctate foci of acute/early subacute infarction are present within the left posterior temporal lobe. No associated hemorrhage or mass effect. 2. Mild for age chronic microvascular ischemic changes and parenchymal volume loss of the brain. These results will be called to the ordering clinician or representative by the Radiologist Assistant, and communication documented in the PACS or zVision Dashboard. Electronically Signed   By: Kristine Garbe M.D.   On: 05/16/2018 21:10    12-lead ECG sinus bradycardia (personally reviewed) All prior EKG's in EPIC reviewed with no documented atrial fibrillation  Telemetry sinus rhythm/sinus brady (personally reviewed)  Assessment and Plan:  1. Cryptogenic stroke The patient presents with cryptogenic stroke.  The patient has a TEE planned for this AM.  I spoke at length with the patient about monitoring for afib with an implantable loop recorder.  Risks, benefits, and alteratives to implantable loop  recorder were discussed with the patient today.   At this time, the patient is very clear in their decision to proceed with implantable loop recorder.   Wound care was reviewed with the patient (keep incision clean and dry for 3 days).  Wound check scheduled and entered in AVS.  Please call with questions.   Chanetta Marshall, NP 05/17/2018 11:11 AM  TIA with aphasia now with interval resolution  2 lesions noted--Echo EF normal with normal LA size  Have reviewed role of LINQ vis a vis [facility] and its assoc risk of stroke and the plan to initiate anticoagulation if AFib were detected  Reviewed the procedure itself  She is agreeable to proceeding

## 2018-05-18 ENCOUNTER — Encounter (HOSPITAL_COMMUNITY): Payer: Self-pay | Admitting: Internal Medicine

## 2018-05-19 ENCOUNTER — Encounter (HOSPITAL_COMMUNITY): Payer: Self-pay | Admitting: Internal Medicine

## 2018-05-24 ENCOUNTER — Other Ambulatory Visit: Payer: Self-pay

## 2018-05-24 DIAGNOSIS — R001 Bradycardia, unspecified: Secondary | ICD-10-CM | POA: Diagnosis not present

## 2018-05-24 DIAGNOSIS — R030 Elevated blood-pressure reading, without diagnosis of hypertension: Secondary | ICD-10-CM | POA: Diagnosis not present

## 2018-05-24 DIAGNOSIS — I6389 Other cerebral infarction: Secondary | ICD-10-CM | POA: Diagnosis not present

## 2018-05-24 DIAGNOSIS — Z6828 Body mass index (BMI) 28.0-28.9, adult: Secondary | ICD-10-CM | POA: Diagnosis not present

## 2018-05-24 DIAGNOSIS — J3089 Other allergic rhinitis: Secondary | ICD-10-CM | POA: Diagnosis not present

## 2018-05-24 NOTE — Patient Outreach (Signed)
[facility]) Care Management  05/24/2018  Kristin Lowe December 24, 1940 [id number]   EMMI- Stroke RED ON EMMI ALERT Day # 3 Date: 05/21/18 Red Alert Reason: Questions/problems with meds? yes   Outreach attempt # 1 Spoke with patient.  She is able to verify HIPAA.  Discussed with patient red alert.  Patient reports that she had some thoughts about her Plavix making her dizzy.  She states however that she has not had any problems with dizziness since Friday and feels ok.  Patient did have a concern about seeing a nurse practitioner today instead of her PCP.  Advised patient that this is a follow up from the hospital and that it is designed to make sure she sees someone sooner after a hospital stay.  She verbalized understanding. Also advised patient that her appointment today is the appropriate place to have questions answered and medications reviewed. She verbalized understanding.  Patient states she is looking into having a [facility] doctor due to things being all in the same system and she prefers that. She states she is looking at the [facility] portal and will discuss with her physician.  Patient states she feels better after talking with CM and will be heading to her PCP office today and asking questions.  Patient voices no further questions or concerns.   Plan: RN CM will close case at this time.    Jone Baseman, RN, MSN [facility] Care Management Care Management Coordinator Direct Line [phone] Toll Free: [phone]  Fax: [phone]

## 2018-05-25 ENCOUNTER — Ambulatory Visit (INDEPENDENT_AMBULATORY_CARE_PROVIDER_SITE_OTHER): Payer: Medicare Other | Admitting: *Deleted

## 2018-05-25 DIAGNOSIS — G459 Transient cerebral ischemic attack, unspecified: Secondary | ICD-10-CM

## 2018-05-25 LAB — CUP PACEART INCLINIC DEVICE CHECK
Implantable Pulse Generator Implant Date: 20190624
MDC IDC SESS DTM: 20190702165137

## 2018-05-25 MED ORDER — APIXABAN 5 MG PO TABS: 5.0000 mg | ORAL_TABLET | Freq: Two times a day (BID) | ORAL | 6 refills | Status: DC

## 2018-05-25 NOTE — Progress Notes (Signed)
Wound check in clinic s/p ILR implant. Steri strips removed prior to appt. Incision edges approximated. Wound well healed without redness or edema. Normal device function. Battery status: GOOD. R-waves 0.64mV. 0 symptom episodes, 0 tachy episodes, Pause/Brady were programmed off in hospital. Dr.Klein recommended that "Pause" be turned ON d/t h/o bradycardia per patient. (2) AF episodes (7.8% burden) - max dur. 8hrs, SK spoke to patient about episodes and recommended that she discontinue ASA/Plavix and start Eliquis 5BID (paper script given per patient request). Common side effects discussed with patient. Patient was instructed to call w/any sx's. Patient education completed including wound care and remote monitoring. Monthly summary reports and ROV with AF clinic in 2 weeks.

## 2018-05-28 ENCOUNTER — Other Ambulatory Visit: Payer: Self-pay

## 2018-05-28 NOTE — Patient Outreach (Signed)
[facility]) Care Management  05/28/2018  [name] Dec 04, 1940 [id number]   EMMI- stroke RED ON EMMI ALERT Day # 1  Date: 05/27/18 Red Alert Reason:  Questions/problems/with meds? yes  Outreach attempt # 1 Telephone call to patient.  No answer. HIPAA compliant voice message left.   Plan: RN CM will attempt patient again within 4 business days.  Jone Baseman, RN, MSN [facility] Care Management Care Management Coordinator Direct Line [phone] Toll Free: [phone]  Fax: [phone]

## 2018-05-28 NOTE — Patient Outreach (Signed)
[facility]) Care Management  05/28/2018  Makana Feigel Oct 03, 1941 [id number]   EMMI- stroke RED ON EMMI ALERT Day # 1  Date:05/27/18 Red Alert Reason: Questions/problems/with meds? yes  Incoming call from patient.  She is able to verify HIPAA. She reports she is doing good and feels good today.  Addressed red flag with patient. She thought she answered things correctly.  She says she has no problems with her medications and is now on Eliquis instead of ASA and plavix.  Advised patient that if there is a question with her answer the system will trigger a nurse call.  She verbalized understanding and offers no concerns.   Plan: RN CM will close case.   Jone Baseman, RN, MSN [facility] Care Management Care Management Coordinator Direct Line [phone] Toll Free: [phone]  Fax: [phone]

## 2018-06-01 ENCOUNTER — Other Ambulatory Visit: Payer: Self-pay

## 2018-06-01 NOTE — Patient Outreach (Signed)
[facility]) Care Management  06/01/2018  Kristin Lowe 1941-03-07 [id number]   EMMI-Stroke RED ON EMMI ALERT Day # 13 Date: 05/31/18 Red Alert Reason:  Problems refilling medications? Yes  Outreach attempt # 1 spoke with patient. She is able to verify HIPAA.  Discussed red alert with patient.  She states that it was a mistake and that she is doing well and has no present needs.    Plan: RN CM will close case.  Jone Baseman, RN, MSN [facility] Care Management Care Management Coordinator Direct Line [phone] Toll Free: [phone]  Fax: [phone]

## 2018-06-02 ENCOUNTER — Ambulatory Visit: Payer: 59

## 2018-06-07 ENCOUNTER — Other Ambulatory Visit: Payer: Self-pay | Admitting: Internal Medicine

## 2018-06-08 ENCOUNTER — Encounter (HOSPITAL_COMMUNITY): Payer: Self-pay | Admitting: Nurse Practitioner

## 2018-06-08 ENCOUNTER — Ambulatory Visit (HOSPITAL_COMMUNITY)
Admission: RE | Admit: 2018-06-08 | Discharge: 2018-06-08 | Disposition: A | Discharge status: Home / Self Care | Payer: Medicare Other | Source: Ambulatory Visit | Attending: Nurse Practitioner | Admitting: Nurse Practitioner

## 2018-06-08 VITALS — BP 136/80 | HR 59 | Ht 65.0 in | Wt 169.0 lb

## 2018-06-08 DIAGNOSIS — Z7901 Long term (current) use of anticoagulants: Secondary | ICD-10-CM | POA: Insufficient documentation

## 2018-06-08 DIAGNOSIS — Z79899 Other long term (current) drug therapy: Secondary | ICD-10-CM | POA: Insufficient documentation

## 2018-06-08 DIAGNOSIS — Z87891 Personal history of nicotine dependence: Secondary | ICD-10-CM | POA: Diagnosis not present

## 2018-06-08 DIAGNOSIS — I480 Paroxysmal atrial fibrillation: Secondary | ICD-10-CM

## 2018-06-08 DIAGNOSIS — Z8673 Personal history of transient ischemic attack (TIA), and cerebral infarction without residual deficits: Secondary | ICD-10-CM | POA: Diagnosis not present

## 2018-06-08 DIAGNOSIS — I10 Essential (primary) hypertension: Secondary | ICD-10-CM | POA: Diagnosis not present

## 2018-06-08 DIAGNOSIS — R001 Bradycardia, unspecified: Secondary | ICD-10-CM | POA: Diagnosis not present

## 2018-06-08 HISTORY — DX: Paroxysmal atrial fibrillation: I48.0

## 2018-06-08 HISTORY — DX: Cerebral infarction, unspecified: I63.9

## 2018-06-08 LAB — CBC WITH DIFFERENTIAL/PLATELET
ABS IMMATURE GRANULOCYTES: 0 10*3/uL (ref 0.0–0.1)
BASOS PCT: 1 %
Basophils Absolute: 0.1 10*3/uL (ref 0.0–0.1)
EOS PCT: 2 %
Eosinophils Absolute: 0.1 10*3/uL (ref 0.0–0.7)
HCT: 40.6 % (ref 36.0–46.0)
Hemoglobin: 12.9 g/dL (ref 12.0–15.0)
Immature Granulocytes: 0 %
Lymphocytes Relative: 25 %
Lymphs Abs: 1.6 10*3/uL (ref 0.7–4.0)
MCH: 29.1 pg (ref 26.0–34.0)
MCHC: 31.8 g/dL (ref 30.0–36.0)
MCV: 91.4 fL (ref 78.0–100.0)
MONO ABS: 0.5 10*3/uL (ref 0.1–1.0)
MONOS PCT: 9 %
Neutro Abs: 4 10*3/uL (ref 1.7–7.7)
Neutrophils Relative %: 63 %
PLATELETS: 264 10*3/uL (ref 150–400)
RBC: 4.44 MIL/uL (ref 3.87–5.11)
RDW: 12.3 % (ref 11.5–15.5)
WBC: 6.3 10*3/uL (ref 4.0–10.5)

## 2018-06-08 LAB — BASIC METABOLIC PANEL
Anion gap: 8 (ref 5–15)
BUN: 10 mg/dL (ref 8–23)
CALCIUM: 10.5 mg/dL — AB (ref 8.9–10.3)
CO2: 29 mmol/L (ref 22–32)
Chloride: 104 mmol/L (ref 98–111)
Creatinine, Ser: 0.81 mg/dL (ref 0.44–1.00)
GFR calc Af Amer: >60 mL/min (ref >60)
GFR calc non Af Amer: >60 mL/min (ref >60)
Glucose, Bld: 117 mg/dL — ABNORMAL HIGH (ref 70–99)
Potassium: 4.4 mmol/L (ref 3.5–5.1)
Sodium: 141 mmol/L (ref 135–145)

## 2018-06-08 MED ORDER — APIXABAN 5 MG PO TABS: 5.0000 mg | ORAL_TABLET | Freq: Two times a day (BID) | ORAL | 0 refills | Status: DC

## 2018-06-08 MED ORDER — APIXABAN 5 MG PO TABS: 5.0000 mg | ORAL_TABLET | Freq: Two times a day (BID) | ORAL | 6 refills | Status: DC

## 2018-06-08 NOTE — Progress Notes (Signed)
Primary Care Physician: [name], MD Primary Electrophysiologist: [name] Referring Physician: Jeanelle Dake is a 77 y.o. female with a history of paroxysmal atrial fibrillation who presents for consultation in the [facility].  The patient was initially diagnosed with atrial fibrillation 2019 after presenting with symptoms of ILR found AF post stroke admission.  Today, she denies symptoms of palpitations, chest pain, shortness of breath, orthopnea, PND, lower extremity edema, dizziness, presyncope, syncope, snoring, daytime somnolence, bleeding, or neurologic sequela. The patient is tolerating medications without difficulties and is otherwise without complaint today.    Atrial Fibrillation Risk Factors:  she does not have symptoms or diagnosis of sleep apnea.  she does not have a history of rheumatic fever.  she does not have a history of alcohol use.  she has a BMI of Body mass index is 28.12 kg/m.[name] Kitchen Filed Weights   06/08/18 1456  Weight: 169 lb (76.7 kg)    LA size: 32   Atrial Fibrillation Management history:  Previous antiarrhythmic drugs: none  Previous cardioversions: none  Previous ablations: none  CHADS2VASC score: 6  Anticoagulation history: Eliquis   Past Medical History:  Diagnosis Date  . Bradycardia   . Hypertension   . Paroxysmal atrial fibrillation (HCC)   . Stroke (cerebrum) [facility])    Past Surgical History:  Procedure Laterality Date  . CATARACT EXTRACTION, BILATERAL    . LOOP RECORDER INSERTION N/A 05/17/2018   Procedure: LOOP RECORDER INSERTION;  Surgeon: Deboraha Sprang, MD;  Location: [place] [facility];  Service: Cardiovascular;  Laterality: N/A;  . LOOP RECORDER INSERTION  05/17/2018   Procedure: LOOP RECORDER INSERTION;  Surgeon: Fay Records, MD;  Location: [place];  Service: Cardiovascular;;  . TEE WITHOUT CARDIOVERSION N/A 05/17/2018   Procedure: TRANSESOPHAGEAL ECHOCARDIOGRAM (TEE);   Surgeon: Fay Records, MD;  Location: [place];  Service: Cardiovascular;  Laterality: N/A;  . TRIGGER FINGER RELEASE    . TUMOR REMOVAL      Current Outpatient Medications  Medication Sig Dispense Refill  . apixaban (ELIQUIS) 5 MG TABS tablet Take 1 tablet (5 mg total) by mouth 2 (two) times daily. 60 tablet 6  . atorvastatin (LIPITOR) 40 MG tablet Take 1 tablet (40 mg total) by mouth daily at 6 PM. 30 tablet 3  . azelastine (ASTELIN) 0.1 % nasal spray Place 2 sprays into the nose 2 (two) times daily.    . Cholecalciferol (VITAMIN D) 2000 units CAPS Take 1 capsule by mouth daily.    . Multiple Vitamins-Minerals (ALIVE ONCE DAILY WOMENS 50+) TABS Take 1 tablet by mouth daily.    . Multiple Vitamins-Minerals (ICAPS AREDS 2 PO) Take 1 capsule by mouth daily.    . Triamcinolone Acetonide (NASACORT AQ NA) Place into the nose as directed.    . diclofenac sodium (VOLTAREN) 1 % GEL Apply 2 g topically 4 (four) times daily as needed (knee pain).     No current facility-administered medications for this encounter.     No Known Allergies  Social History   Socioeconomic History  . Marital status: Unknown    Spouse name: Not on file  . Number of children: Not on file  . Years of education: Not on file  . Highest education level: Not on file  Occupational History  . Not on file  Social Needs  . Financial resource strain: Not on file  . Food insecurity:    Worry: Not on file    Inability: Not  on file  . Transportation needs:    Medical: Not on file    Non-medical: Not on file  Tobacco Use  . Smoking status: Former Research scientist (life sciences)  . Smokeless tobacco: Never Used  Substance and Sexual Activity  . Alcohol use: No  . Drug use: Not on file  . Sexual activity: Not on file  Lifestyle  . Physical activity:    Days per week: Not on file    Minutes per session: Not on file  . Stress: Not on file  Relationships  . Social connections:    Talks on phone: Not on file    Gets together: Not on  file    Attends religious service: Not on file    Active member of club or organization: Not on file    Attends meetings of clubs or organizations: Not on file    Relationship status: Not on file  . Intimate partner violence:    Fear of current or ex partner: Not on file    Emotionally abused: Not on file    Physically abused: Not on file    Forced sexual activity: Not on file  Other Topics Concern  . Not on file  Social History Narrative  . Not on file    Family History  Problem Relation Age of Onset  . Breast cancer Mother   . Diabetes Mother   . Thyroid disease Mother   . Heart disease Father   . Hypertension Sister   . Thyroid disease Sister   . Diabetes Brother   . Hypertension Brother    The patient does not have a history of early familial atrial fibrillation or other arrhythmias.  ROS- All systems are reviewed and negative except as per the HPI above.  Physical Exam: Vitals:   06/08/18 1456  BP: 136/80  Pulse: (!) 59  SpO2: 96%  Weight: 169 lb (76.7 kg)  Height: 5\' 5"  (1.651 m)    GEN- The patient is well appearing, alert and oriented x 3 today.   Head- normocephalic, atraumatic Eyes-  Sclera clear, conjunctiva pink Ears- hearing intact Oropharynx- clear Neck- supple  Lungs- Clear to ausculation bilaterally, normal work of breathing Heart- Regular rate and rhythm  GI- soft, NT, ND, + BS Extremities- no clubbing, cyanosis, or edema MS- no significant deformity or atrophy Skin- no rash or lesion Psych- euthymic mood, full affect Neuro- strength and sensation are intact  Wt Readings from Last 3 Encounters:  06/08/18 169 lb (76.7 kg)  05/15/18 179 lb 10.8 oz (81.5 kg)  09/17/17 167 lb (75.8 kg)    EKG today demonstrates sinus bradycardia, rate 59, normal intervals Echo 04/2018 demonstrated EF 55-60%, no RWMA, mild MR  Epic records are reviewed at length today  Assessment and Plan:  1. Paroxysmal atrial fibrillation Diagnosed by ILR after recent  cryptogenic stroke She has been started on Eliquis. No bleeding issues CBC, BMET today Patient assistance for Eliquis filled out today [place] is 6  2. HTN Stable No change required today  Follow up with AF clinic 3 months   Chanetta Marshall, NP 06/08/2018 3:15 PM

## 2018-06-16 ENCOUNTER — Ambulatory Visit (INDEPENDENT_AMBULATORY_CARE_PROVIDER_SITE_OTHER): Payer: Medicare Other | Admitting: Adult Health

## 2018-06-16 ENCOUNTER — Encounter: Payer: Self-pay | Admitting: Adult Health

## 2018-06-16 VITALS — BP 116/62 | HR 57 | Ht 66.0 in | Wt 170.2 lb

## 2018-06-16 DIAGNOSIS — E785 Hyperlipidemia, unspecified: Secondary | ICD-10-CM

## 2018-06-16 DIAGNOSIS — I480 Paroxysmal atrial fibrillation: Secondary | ICD-10-CM | POA: Diagnosis not present

## 2018-06-16 DIAGNOSIS — I10 Essential (primary) hypertension: Secondary | ICD-10-CM

## 2018-06-16 DIAGNOSIS — I6389 Other cerebral infarction: Secondary | ICD-10-CM | POA: Diagnosis not present

## 2018-06-16 NOTE — Patient Instructions (Addendum)
Continue Eliquis (apixaban) daily  and lipitor  for secondary stroke prevention  Continue to stay active and eat healthy  Continue to follow up with PCP regarding cholesterol and blood pressure management   Continue to follow up with cardiologist regarding atrial fibrillation and eliquis management  Maintain strict control of hypertension with blood pressure goal below 130/90, diabetes with hemoglobin A1c goal below 6.5% and cholesterol with LDL cholesterol (bad cholesterol) goal below 70 mg/dL. I also advised the patient to eat a healthy diet with plenty of whole grains, cereals, fruits and vegetables, exercise regularly and maintain ideal body weight.  Followup in the future with me in 6 months or call earlier if needed        Thank you for coming to see [place] at [facility]. I hope we have been able to provide you high quality care today.  You may receive a patient satisfaction survey over the next few weeks. We would appreciate your feedback and comments so that we may continue to improve ourselves and the health of our patients.

## 2018-06-16 NOTE — Progress Notes (Signed)
[facility] [address] [place]. [place] [address] [phone]       OFFICE FOLLOW UP NOTE  [name] Date of Birth:  06-21-1941 Medical Record Number:  [id number]   Reason for Referral:  hospital stroke follow up  CHIEF COMPLAINT:  Chief Complaint  Patient presents with  . New Patient (Initial Visit)    Patient reports that per Dr. [name] she does not qualify fot the [facility] study. She reports that she is doing well on Eliquis.     HPI: Kristin Lowe is being seen today for initial visit in the office for left posterior temporal lobe on 05/15/18. History obtained from patient and chart review. Reviewed all radiology images and labs personally.  [name] is a 77 y.o. female with history of hypertension and bradycardia who presented with transient headache and speech difficulties. She did not receive IV t-PA due to resolution of deficits.  CT head reviewed.  And showed small focal hypodensity in the left subinsular region which could possibly be an age-indeterminate lacunar infarct.  MRI head showed 2 punctate areas of acute infarct in the left posterior temporal lobe.  CTA head and neck negative.  2D echo showed normal EF without cardiac source of embolus.  TEE was negative for PFO or SOE.  Due to this event being cryptogenic in etiology loop recorder was placed to rule out atrial fibrillation.  LDL 106 and his patient was not on statin PTA recommended to start Lipitor 40 mg daily.  A1c satisfactory at 5.5.  Recommended DAPT for 3 weeks and then Plavix alone.  Recommended to consider possible participation in [place] cryptogenic stroke trial in the future and advised to discuss outpatient follow-up. On 05/25/2018, loop recorder was found to have 2 episodes of AF and it was recommended to discontinue aspirin and Plavix and start Eliquis 5 mg twice daily.   Patient is being seen today for hospital follow-up and is accompanied by her grandson.   Overall she continues to do well without residual symptoms or side effects from her stroke.  As atrial fibrillation was found, she continues to take Eliquis without side effects of bleeding or bruising.  Continues to take Lipitor without side effects myalgias.  Blood pressure satisfactory 116/62.  Patient has been to return to all previous activities without complications.  She does participate at a gym 3 times weekly and maintains a healthy diet.  As atrial fibrillation was found, patient is not eligible for [place] trial.  Denies new or worsening stroke/TIA symptoms.    ROS:   14 system review of systems performed and negative with exception of eye pain, easy bruising, joint pain and moles  PMH:  Past Medical History:  Diagnosis Date  . Bradycardia   . Hypertension   . Paroxysmal atrial fibrillation (HCC)   . Stroke (cerebrum) (HCC)     PSH:  Past Surgical History:  Procedure Laterality Date  . CATARACT EXTRACTION, BILATERAL    . LOOP RECORDER INSERTION N/A 05/17/2018   Procedure: LOOP RECORDER INSERTION;  Surgeon: Deboraha Sprang, MD;  Location: [place] [facility];  Service: Cardiovascular;  Laterality: N/A;  . LOOP RECORDER INSERTION  05/17/2018   Procedure: LOOP RECORDER INSERTION;  Surgeon: Fay Records, MD;  Location: [place];  Service: Cardiovascular;;  . TEE WITHOUT CARDIOVERSION N/A 05/17/2018   Procedure: TRANSESOPHAGEAL ECHOCARDIOGRAM (TEE);  Surgeon: Fay Records, MD;  Location: [place];  Service: Cardiovascular;  Laterality: N/A;  . TRIGGER FINGER RELEASE    .  TUMOR REMOVAL      Social History:  Social History   Socioeconomic History  . Marital status: Unknown    Spouse name: Not on file  . Number of children: Not on file  . Years of education: Not on file  . Highest education level: Not on file  Occupational History  . Not on file  Social Needs  . Financial resource strain: Not on file  . Food insecurity:    Worry: Not on file    Inability: Not on  file  . Transportation needs:    Medical: Not on file    Non-medical: Not on file  Tobacco Use  . Smoking status: Former Research scientist (life sciences)  . Smokeless tobacco: Never Used  Substance and Sexual Activity  . Alcohol use: No  . Drug use: Not on file  . Sexual activity: Not on file  Lifestyle  . Physical activity:    Days per week: Not on file    Minutes per session: Not on file  . Stress: Not on file  Relationships  . Social connections:    Talks on phone: Not on file    Gets together: Not on file    Attends religious service: Not on file    Active member of club or organization: Not on file    Attends meetings of clubs or organizations: Not on file    Relationship status: Not on file  . Intimate partner violence:    Fear of current or ex partner: Not on file    Emotionally abused: Not on file    Physically abused: Not on file    Forced sexual activity: Not on file  Other Topics Concern  . Not on file  Social History Narrative  . Not on file    Family History:  Family History  Problem Relation Age of Onset  . Breast cancer Mother   . Diabetes Mother   . Thyroid disease Mother   . Heart disease Father   . Hypertension Sister   . Thyroid disease Sister   . Diabetes Brother   . Hypertension Brother     Medications:   Current Outpatient Medications on File Prior to Visit  Medication Sig Dispense Refill  . apixaban (ELIQUIS) 5 MG TABS tablet Take 1 tablet (5 mg total) by mouth 2 (two) times daily. 60 tablet 0  . atorvastatin (LIPITOR) 40 MG tablet Take 1 tablet (40 mg total) by mouth daily at 6 PM. 30 tablet 3  . azelastine (ASTELIN) 0.1 % nasal spray Place 2 sprays into the nose 2 (two) times daily.    . Cholecalciferol (VITAMIN D) 2000 units CAPS Take 1 capsule by mouth daily.    . diclofenac sodium (VOLTAREN) 1 % GEL Apply 2 g topically 4 (four) times daily as needed (knee pain).    . Multiple Vitamins-Minerals (ALIVE ONCE DAILY WOMENS 50+) TABS Take 1 tablet by mouth daily.     . Multiple Vitamins-Minerals (ICAPS AREDS 2 PO) Take 1 capsule by mouth daily.    . Triamcinolone Acetonide (NASACORT AQ NA) Place into the nose as directed.     No current facility-administered medications on file prior to visit.     Allergies:  No Known Allergies   Physical Exam  Vitals:   06/16/18 1531  BP: 116/62  Pulse: (!) 57  Weight: 170 lb 3.2 oz (77.2 kg)  Height: 5\' 6"  (1.676 m)   Body mass index is 27.47 kg/m. No exam data present  General: well developed, well  nourished, pleasant elderly Caucasian female, seated, in no evident distress Head: head normocephalic and atraumatic.   Neck: supple with no carotid or supraclavicular bruits Cardiovascular: regular rate and rhythm, no murmurs Musculoskeletal: no deformity Skin:  no rash/petichiae Vascular:  Normal pulses all extremities  Neurologic Exam Mental Status: Awake and fully alert. Oriented to place and time. Recent and remote memory intact. Attention span, concentration and fund of knowledge appropriate. Mood and affect appropriate.  Cranial Nerves: Fundoscopic exam reveals sharp disc margins. Pupils equal, briskly reactive to light. Extraocular movements full without nystagmus. Visual fields full to confrontation. Hearing intact. Facial sensation intact. Face, tongue, palate moves normally and symmetrically.  Motor: Normal bulk and tone. Normal strength in all tested extremity muscles. Sensory.: intact to touch , pinprick , position and vibratory sensation.  Coordination: Rapid alternating movements normal in all extremities. Finger-to-nose and heel-to-shin performed accurately bilaterally. Gait and Station: Arises from chair without difficulty. Stance is normal. Gait demonstrates normal stride length and balance . Able to heel, toe and tandem walk without difficulty.  Reflexes: 1+ and symmetric. Toes downgoing.    NIHSS  0 Modified Rankin  0 HAS-BLED 3 CHA2DS2-VASc 6   Diagnostic Data (Labs, Imaging,  Testing)  CT HEAD WO CONTRAST 05/15/18 IMPRESSION: 1. Negative for large vessel territorial infarct, hemorrhage or mass. 2. Small focal hypodensity in the left sub insular region, possible age indeterminate lacunar infarct.  CT ANGIO HEAD/NECK W OR WO CONTRAST 05/16/2018 IMPRESSION: Normal CTA of the head and neck. No large vessel occlusion. No high-grade or correctable stenosis  MR BRAIN WO CONTRAST 05/16/18 IMPRESSION: 1. Two punctate foci of acute/early subacute infarction are present within the left posterior temporal lobe. No associated hemorrhage or mass effect. 2. Mild for age chronic microvascular ischemic changes and parenchymal volume loss of the brain.     ASSESSMENT: Kristin Lowe is a 77 y.o. 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 Vascular risk factors include HLD and HTN.     PLAN: -Continue Eliquis (apixaban) daily  and lipitor  for secondary stroke prevention -Atrial fibrillation was found therefore no longer eligible for [place] trial -F/u with PCP regarding your HLD and HTN management -f/u with cardiologist regarding atrial fibrillation and Eliquis management -continue to monitor BP at home -Patient was advised to continue to stay active and maintain a healthy diet -Maintain strict control of hypertension with blood pressure goal below 130/90, diabetes with hemoglobin A1c goal below 6.5% and cholesterol with LDL cholesterol (bad cholesterol) goal below 70 mg/dL. I also advised the patient to eat a healthy diet with plenty of whole grains, cereals, fruits and vegetables, exercise regularly and maintain ideal body weight.  Follow up in 6 months or call earlier if needed   Greater than 50% of time during this 25 minute visit was spent on counseling,explanation of diagnosis of left posterior temporal lobe infarct, reviewing risk factor management of HLD and HTN, planning of further management, discussion with  patient and family and coordination of care    Venancio Poisson, [facility]  [facility] [address] [place], [place] [address]  Phone [phone] Fax [phone]

## 2018-06-17 NOTE — Progress Notes (Signed)
I agree with the above plan 

## 2018-06-21 ENCOUNTER — Ambulatory Visit (INDEPENDENT_AMBULATORY_CARE_PROVIDER_SITE_OTHER): Payer: Medicare Other | Admitting: *Deleted

## 2018-06-21 DIAGNOSIS — G459 Transient cerebral ischemic attack, unspecified: Secondary | ICD-10-CM | POA: Diagnosis not present

## 2018-06-21 NOTE — Progress Notes (Signed)
[facility] Summary Report / Loop Recorder 

## 2018-07-14 DIAGNOSIS — Z79899 Other long term (current) drug therapy: Secondary | ICD-10-CM | POA: Diagnosis not present

## 2018-07-14 DIAGNOSIS — M859 Disorder of bone density and structure, unspecified: Secondary | ICD-10-CM | POA: Diagnosis not present

## 2018-07-14 DIAGNOSIS — E041 Nontoxic single thyroid nodule: Secondary | ICD-10-CM | POA: Diagnosis not present

## 2018-07-14 DIAGNOSIS — R82998 Other abnormal findings in urine: Secondary | ICD-10-CM | POA: Diagnosis not present

## 2018-07-19 DIAGNOSIS — Z1212 Encounter for screening for malignant neoplasm of rectum: Secondary | ICD-10-CM | POA: Diagnosis not present

## 2018-07-21 DIAGNOSIS — M4302 Spondylolysis, cervical region: Secondary | ICD-10-CM | POA: Diagnosis not present

## 2018-07-21 DIAGNOSIS — I444 Left anterior fascicular block: Secondary | ICD-10-CM | POA: Diagnosis not present

## 2018-07-21 DIAGNOSIS — Z6827 Body mass index (BMI) 27.0-27.9, adult: Secondary | ICD-10-CM | POA: Diagnosis not present

## 2018-07-21 DIAGNOSIS — Z1389 Encounter for screening for other disorder: Secondary | ICD-10-CM | POA: Diagnosis not present

## 2018-07-21 DIAGNOSIS — R001 Bradycardia, unspecified: Secondary | ICD-10-CM | POA: Diagnosis not present

## 2018-07-21 DIAGNOSIS — M170 Bilateral primary osteoarthritis of knee: Secondary | ICD-10-CM | POA: Diagnosis not present

## 2018-07-21 DIAGNOSIS — I480 Paroxysmal atrial fibrillation: Secondary | ICD-10-CM | POA: Diagnosis not present

## 2018-07-21 DIAGNOSIS — M859 Disorder of bone density and structure, unspecified: Secondary | ICD-10-CM | POA: Diagnosis not present

## 2018-07-21 DIAGNOSIS — I6389 Other cerebral infarction: Secondary | ICD-10-CM | POA: Diagnosis not present

## 2018-07-21 DIAGNOSIS — E041 Nontoxic single thyroid nodule: Secondary | ICD-10-CM | POA: Diagnosis not present

## 2018-07-21 DIAGNOSIS — Z23 Encounter for immunization: Secondary | ICD-10-CM | POA: Diagnosis not present

## 2018-07-21 DIAGNOSIS — Z0000 Encounter for general adult medical examination without abnormal findings: Secondary | ICD-10-CM | POA: Diagnosis not present

## 2018-07-21 DIAGNOSIS — I493 Ventricular premature depolarization: Secondary | ICD-10-CM | POA: Diagnosis not present

## 2018-07-22 ENCOUNTER — Ambulatory Visit (INDEPENDENT_AMBULATORY_CARE_PROVIDER_SITE_OTHER): Payer: Medicare Other | Admitting: *Deleted

## 2018-07-22 DIAGNOSIS — I639 Cerebral infarction, unspecified: Secondary | ICD-10-CM

## 2018-07-23 ENCOUNTER — Encounter: Payer: Self-pay | Admitting: Internal Medicine

## 2018-07-23 NOTE — Progress Notes (Signed)
[facility] Summary Report / Loop Recorder 

## 2018-07-30 DIAGNOSIS — Z87891 Personal history of nicotine dependence: Secondary | ICD-10-CM | POA: Diagnosis not present

## 2018-07-30 DIAGNOSIS — J0191 Acute recurrent sinusitis, unspecified: Secondary | ICD-10-CM | POA: Diagnosis not present

## 2018-08-02 LAB — CUP PACEART REMOTE DEVICE CHECK
Date Time Interrogation Session: 20190727200629
MDC IDC PG IMPLANT DT: 20190624

## 2018-08-18 LAB — CUP PACEART REMOTE DEVICE CHECK
Implantable Pulse Generator Implant Date: 20190624
MDC IDC SESS DTM: 20190829203544

## 2018-08-24 ENCOUNTER — Ambulatory Visit (INDEPENDENT_AMBULATORY_CARE_PROVIDER_SITE_OTHER): Payer: Medicare Other | Admitting: *Deleted

## 2018-08-24 DIAGNOSIS — I639 Cerebral infarction, unspecified: Secondary | ICD-10-CM | POA: Diagnosis not present

## 2018-08-25 LAB — CUP PACEART REMOTE DEVICE CHECK
Implantable Pulse Generator Implant Date: 20190624
MDC IDC SESS DTM: 20191001213943

## 2018-08-25 NOTE — Progress Notes (Signed)
[facility] Summary Report / Loop Recorder 

## 2018-08-27 DIAGNOSIS — R05 Cough: Secondary | ICD-10-CM | POA: Diagnosis not present

## 2018-08-27 DIAGNOSIS — Z6827 Body mass index (BMI) 27.0-27.9, adult: Secondary | ICD-10-CM | POA: Diagnosis not present

## 2018-08-27 DIAGNOSIS — J45909 Unspecified asthma, uncomplicated: Secondary | ICD-10-CM | POA: Diagnosis not present

## 2018-08-27 DIAGNOSIS — J309 Allergic rhinitis, unspecified: Secondary | ICD-10-CM | POA: Diagnosis not present

## 2018-09-03 ENCOUNTER — Encounter: Payer: Medicare Other | Admitting: Internal Medicine

## 2018-09-03 ENCOUNTER — Ambulatory Visit (INDEPENDENT_AMBULATORY_CARE_PROVIDER_SITE_OTHER): Payer: Medicare Other | Admitting: Internal Medicine

## 2018-09-03 ENCOUNTER — Encounter: Payer: Self-pay | Admitting: Internal Medicine

## 2018-09-03 VITALS — BP 132/84 | HR 57 | Ht 66.0 in | Wt 166.8 lb

## 2018-09-03 DIAGNOSIS — G459 Transient cerebral ischemic attack, unspecified: Secondary | ICD-10-CM

## 2018-09-03 DIAGNOSIS — I480 Paroxysmal atrial fibrillation: Secondary | ICD-10-CM

## 2018-09-03 DIAGNOSIS — I639 Cerebral infarction, unspecified: Secondary | ICD-10-CM | POA: Diagnosis not present

## 2018-09-03 NOTE — Progress Notes (Signed)
Patient Care Team: [name], MD as PCP - General (Internal Medicine)   HPI  Kristin Lowe is a 76 y.o. female Seen in followup for Cryptogenic Stroke  Implantable loop recorder was inserted demonstrating atrial fibrillation.  DAPT was discontinued and Eliquis was started  DATE TEST EF   6/19 Echo   55-65 %           Date Cr Hgb  7/19 0.81  12.9          Thromboembolic risk factors ( age  -2, HTN-1, TIA/CVA-2, DM-1, Gender-1) for a CHADSVASc Score of 6  The patient denies chest pain, shortness of breath, nocturnal dyspnea, orthopnea or peripheral edema.  There have been no palpitations, lightheadedness or syncope.    Records and Results Reviewed   Past Medical History:  Diagnosis Date  . Bradycardia   . Hypertension   . Paroxysmal atrial fibrillation (HCC)   . Stroke (cerebrum) [facility])     Past Surgical History:  Procedure Laterality Date  . CATARACT EXTRACTION, BILATERAL    . LOOP RECORDER INSERTION N/A 05/17/2018   Procedure: LOOP RECORDER INSERTION;  Surgeon: Deboraha Sprang, MD;  Location: [place] [facility];  Service: Cardiovascular;  Laterality: N/A;  . LOOP RECORDER INSERTION  05/17/2018   Procedure: LOOP RECORDER INSERTION;  Surgeon: Fay Records, MD;  Location: [place];  Service: Cardiovascular;;  . TEE WITHOUT CARDIOVERSION N/A 05/17/2018   Procedure: TRANSESOPHAGEAL ECHOCARDIOGRAM (TEE);  Surgeon: Fay Records, MD;  Location: [place];  Service: Cardiovascular;  Laterality: N/A;  . TRIGGER FINGER RELEASE    . TUMOR REMOVAL      Current Meds  Medication Sig  . apixaban (ELIQUIS) 5 MG TABS tablet Take 1 tablet (5 mg total) by mouth 2 (two) times daily.  [name] Kitchen atorvastatin (LIPITOR) 40 MG tablet Take 1 tablet (40 mg total) by mouth daily at 6 PM.  . azelastine (ASTELIN) 0.1 % nasal spray Place 2 sprays into the nose 2 (two) times daily.  . Cholecalciferol (VITAMIN D) 2000 units CAPS Take 1 capsule by mouth daily.  . diclofenac sodium  (VOLTAREN) 1 % GEL Apply 2 g topically 4 (four) times daily as needed (knee pain).  . Multiple Vitamins-Minerals (ICAPS AREDS 2 PO) Take 1 capsule by mouth daily.  . Triamcinolone Acetonide (NASACORT AQ NA) Place into the nose as directed.    No Known Allergies    Review of Systems negative except from HPI and PMH  Physical Exam BP 132/84   Pulse (!) 57   Ht 5\' 6"  (1.676 m)   Wt 166 lb 12.8 oz (75.7 kg)   SpO2 98%   BMI 26.92 kg/m   Well developed and nourished in no acute distress HENT normal Neck supple with JVP-flat Clear Regular rate and rhythm, no murmurs or gallops Abd-soft with active BS No Clubbing cyanosis edema Skin-warm and dry A & Oriented  Grossly normal sensory and motor function  ECG Sinus @ 57 [birth year]  Assessment and  Plan  Cryptogenic Stroke  Bradycardia    Intercurrent atrial fibrillation.  No bleeding continue anticoagulation  We will have her follow-up with Dr. Joylene Draft and we will see her in 1 year.  We are available to Dr. Joylene Draft  as necessary.  If you would like to follow her going forward, we can remove ourselves from the situation except for monitoring of her atrial fibrillation burden.     Current medicines are reviewed at length with the patient  today .  The patient does not  have concerns regarding medicines.

## 2018-09-03 NOTE — Patient Instructions (Signed)
Medication Instructions:  Your physician recommends that you continue on your current medications as directed. Please refer to the Current Medication list given to you today.   Labwork: None ordered.  Testing/Procedures: None ordered.  Follow-Up: Your physician recommends that you schedule a follow-up appointment in:   One Year with Roderic Palau in the A Fib clinic.  Any Other Special Instructions Will Be Listed Below (If Applicable).     If you need a refill on your cardiac medications before your next appointment, please call your pharmacy.

## 2018-09-08 ENCOUNTER — Ambulatory Visit (HOSPITAL_COMMUNITY): Payer: 59 | Admitting: Nurse Practitioner

## 2018-09-23 ENCOUNTER — Other Ambulatory Visit: Payer: Self-pay | Admitting: Internal Medicine

## 2018-09-23 DIAGNOSIS — Z1231 Encounter for screening mammogram for malignant neoplasm of breast: Secondary | ICD-10-CM

## 2018-09-27 ENCOUNTER — Ambulatory Visit (INDEPENDENT_AMBULATORY_CARE_PROVIDER_SITE_OTHER): Payer: Medicare Other | Admitting: *Deleted

## 2018-09-27 DIAGNOSIS — I639 Cerebral infarction, unspecified: Secondary | ICD-10-CM | POA: Diagnosis not present

## 2018-09-27 DIAGNOSIS — L820 Inflamed seborrheic keratosis: Secondary | ICD-10-CM | POA: Diagnosis not present

## 2018-09-27 DIAGNOSIS — L570 Actinic keratosis: Secondary | ICD-10-CM | POA: Diagnosis not present

## 2018-09-27 NOTE — Progress Notes (Signed)
[facility] Summary Report / Loop Recorder 

## 2018-09-30 LAB — CUP PACEART INCLINIC DEVICE CHECK
Implantable Pulse Generator Implant Date: 20190624
MDC IDC SESS DTM: 20191011122538

## 2018-10-11 ENCOUNTER — Telehealth: Payer: Self-pay

## 2018-10-11 NOTE — Telephone Encounter (Addendum)
**Note De-Identified  Obfuscation** The pt brought in her completed [place] pt asst application for Eliquis to the office. I have completed the provide part of the application and placed it in Dr Olin Pia mail bin awaiting her signature.

## 2018-10-15 NOTE — Telephone Encounter (Signed)
The pt is advised that I faxed her application to [employer] today. She verbalized understanding and is requesting a copy of her application. I have placed her a copy in the front office for her to pick up.

## 2018-10-15 NOTE — Telephone Encounter (Signed)
° ° °  Please return call to patient °

## 2018-10-29 ENCOUNTER — Ambulatory Visit (INDEPENDENT_AMBULATORY_CARE_PROVIDER_SITE_OTHER): Payer: Medicare Other

## 2018-10-29 DIAGNOSIS — I639 Cerebral infarction, unspecified: Secondary | ICD-10-CM | POA: Diagnosis not present

## 2018-11-01 NOTE — Progress Notes (Signed)
[facility] Summary Report / Loop Recorder 

## 2018-11-02 DIAGNOSIS — W19XXXA Unspecified fall, initial encounter: Secondary | ICD-10-CM | POA: Diagnosis not present

## 2018-11-02 DIAGNOSIS — R0781 Pleurodynia: Secondary | ICD-10-CM | POA: Diagnosis not present

## 2018-11-02 DIAGNOSIS — R439 Unspecified disturbances of smell and taste: Secondary | ICD-10-CM | POA: Diagnosis not present

## 2018-11-02 DIAGNOSIS — Z6828 Body mass index (BMI) 28.0-28.9, adult: Secondary | ICD-10-CM | POA: Diagnosis not present

## 2018-11-04 ENCOUNTER — Ambulatory Visit
Admission: RE | Admit: 2018-11-04 | Discharge: 2018-11-04 | Disposition: A | Discharge status: Home / Self Care | Payer: Medicare Other | Source: Ambulatory Visit | Attending: Internal Medicine | Admitting: Internal Medicine

## 2018-11-04 ENCOUNTER — Ambulatory Visit: Payer: Medicare Other

## 2018-11-04 DIAGNOSIS — Z1231 Encounter for screening mammogram for malignant neoplasm of breast: Secondary | ICD-10-CM | POA: Diagnosis not present

## 2018-11-11 ENCOUNTER — Telehealth: Payer: Self-pay | Admitting: Cardiology

## 2018-11-11 NOTE — Telephone Encounter (Signed)
Patient has questions regarding timing with her loop recorder and her home monitor. She stated that she called tech support and they told her to call the office to discuss coming into the office to reset the time that loop recorder will communicate with her home monitor. She feels like the monitor is reading when she is not near the monitor b/c she has walked into the room and it appears to be reading the device. I talked to her about the software updates and she stated that tech support said nothing about the software updates.

## 2018-11-12 NOTE — Telephone Encounter (Signed)
Spoke with patient. Educated patient regarding normal monitor function. Patient verbalizes understanding and denies additional concerns at this time.

## 2018-11-12 NOTE — Telephone Encounter (Signed)
LMOM requesting call back to the [facility]. Gave direct number for return call.

## 2018-11-20 LAB — CUP PACEART REMOTE DEVICE CHECK
Implantable Pulse Generator Implant Date: 20190624
MDC IDC SESS DTM: 20191103223959

## 2018-12-01 ENCOUNTER — Ambulatory Visit (INDEPENDENT_AMBULATORY_CARE_PROVIDER_SITE_OTHER): Payer: Medicare Other

## 2018-12-01 DIAGNOSIS — I639 Cerebral infarction, unspecified: Secondary | ICD-10-CM

## 2018-12-02 ENCOUNTER — Telehealth: Payer: Self-pay | Admitting: Internal Medicine

## 2018-12-02 DIAGNOSIS — Z6828 Body mass index (BMI) 28.0-28.9, adult: Secondary | ICD-10-CM | POA: Diagnosis not present

## 2018-12-02 DIAGNOSIS — R001 Bradycardia, unspecified: Secondary | ICD-10-CM | POA: Diagnosis not present

## 2018-12-02 DIAGNOSIS — I480 Paroxysmal atrial fibrillation: Secondary | ICD-10-CM | POA: Diagnosis not present

## 2018-12-02 DIAGNOSIS — R030 Elevated blood-pressure reading, without diagnosis of hypertension: Secondary | ICD-10-CM | POA: Diagnosis not present

## 2018-12-02 LAB — CUP PACEART REMOTE DEVICE CHECK
Date Time Interrogation Session: 20200108234005
Implantable Pulse Generator Implant Date: 20190624

## 2018-12-02 NOTE — Telephone Encounter (Signed)
Spoke with pt who states she believes she had an episode of Afib on 12/31. She states she was in bed and she noticed it through the night, but it ended in the morning. Pt confirms she has been taking her eliquis as directed. Pt denies another episode. I advised pt that if she notice more frequent episodes, please call the office for further recommendations and/or an OV. Pt has verbalized understanding and had no additional questions.

## 2018-12-02 NOTE — Telephone Encounter (Signed)
° °  Patient calling to report episode of afib on 12/31

## 2018-12-03 NOTE — Progress Notes (Signed)
[facility] Summary Report / Loop Recorder 

## 2018-12-10 DIAGNOSIS — Z23 Encounter for immunization: Secondary | ICD-10-CM | POA: Diagnosis not present

## 2018-12-12 LAB — CUP PACEART REMOTE DEVICE CHECK
Date Time Interrogation Session: 20191206231059
MDC IDC PG IMPLANT DT: 20190624

## 2018-12-21 ENCOUNTER — Ambulatory Visit (INDEPENDENT_AMBULATORY_CARE_PROVIDER_SITE_OTHER): Payer: Medicare Other | Admitting: Adult Health

## 2018-12-21 VITALS — BP 130/61 | HR 49 | Ht 66.0 in | Wt 169.8 lb

## 2018-12-21 DIAGNOSIS — E785 Hyperlipidemia, unspecified: Secondary | ICD-10-CM

## 2018-12-21 DIAGNOSIS — I6389 Other cerebral infarction: Secondary | ICD-10-CM

## 2018-12-21 DIAGNOSIS — H04129 Dry eye syndrome of unspecified lacrimal gland: Secondary | ICD-10-CM | POA: Diagnosis not present

## 2018-12-21 DIAGNOSIS — I480 Paroxysmal atrial fibrillation: Secondary | ICD-10-CM

## 2018-12-21 NOTE — Patient Instructions (Signed)
Referral placed to Ophthalmology - [facility] to further management of dry eyes.  Ophthalmology - [facility] (formerly known as [facility] [address], [place] [address] [phone]  Continue Eliquis (apixaban) daily  and lipitor  for secondary stroke prevention  Continue to follow with cardiology for atrial fibrillation management  Continue to follow up with PCP regarding cholesterol management   Continue to monitor blood pressure at home  Maintain strict control of hypertension with blood pressure goal below 130/90, diabetes with hemoglobin A1c goal below 6.5% and cholesterol with LDL cholesterol (bad cholesterol) goal below 70 mg/dL. I also advised the patient to eat a healthy diet with plenty of whole grains, cereals, fruits and vegetables, exercise regularly and maintain ideal body weight.  Followup in the future with me in 6 months or call earlier if needed       Thank you for coming to see [place] at [facility]. I hope we have been able to provide you high quality care today.  You may receive a patient satisfaction survey over the next few weeks. We would appreciate your feedback and comments so that we may continue to improve ourselves and the health of our patients.

## 2018-12-21 NOTE — Progress Notes (Signed)
[facility] [address] [place]. [place] [address] [phone]       OFFICE FOLLOW UP NOTE  Ms. Vasilia Dise Date of Birth:  [birth year] Medical Record Number:  [id number]   Reason for Referral:  hospital stroke follow up  CHIEF COMPLAINT:  Chief Complaint  Patient presents with  . Left temporal lobe infarction    rm 9,"doing well"  . Follow-up    6 month    HPI: 12/21/18  Mrs. 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  She continues to do well from a stroke standpoint without residual deficits or recurring of symptoms.  Continues on Eliquis for atrial fibrillation and secondary stroke prevention without side effects of bleeding or bruising.  Continues on atorvastatin without side effects myalgias. She did have cholesterol levels checked by her PCP since prior appt and per patient, satisfactory levels. Blood pressure today 130/61.  She does have concerns of dry eyes which at times can cause blurry vision.  She states that she has attempted to use numerous different OTC drops without benefit.  She does have a history of cataracts procedure and has tubes in her tear ducts.  She does have a history of allergies and feels as though this could be due to her allergies.  She is questioning whether there are ophthalmologists that specialize in conditions when eyes are affected by allergies.  Denies new or worsening stroke/TIA symptoms.   History summary:  Maryelizabeth Eberle  is a 78 y.o. 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  MRI head showed 2 punctate areas of acute infarct in the left posterior temporal lobe.  CTA head and neck negative.  2D echo showed normal EF without cardiac source of embolus.  TEE was negative for PFO or SOE.  Loop  recorder placed to rule atrial fibrillation due to cryptogenic etiology.  Initiated DAPT for 3 weeks and Plavix alone along with atorvastatin.  Discharged home in stable condition.  All imaging from hospital admission in detail below and personally reviewed. She was seen in the office post hospital discharge on 06/16/2018 without residual deficits or recurring of symptoms.  Loop recorder showed 2 episodes of AF on 05/25/2018 and discontinued DAPT and initiated Eliquis.  She continued on all prescribed medications without reported side effects.  Return back to all prior activities and stable from stroke standpoint.      ROS:   14 system review of systems performed and negative with exception of eye itching, eye redness, dry eye and muscle aching  PMH:  Past Medical History:  Diagnosis Date  . Bradycardia   . Hypertension   . Paroxysmal atrial fibrillation (HCC)   . Stroke (cerebrum) (HCC)     PSH:  Past Surgical History:  Procedure Laterality Date  . CATARACT EXTRACTION, BILATERAL    . LOOP RECORDER INSERTION N/A 05/17/2018   Procedure: LOOP RECORDER INSERTION;  Surgeon: Deboraha Sprang, MD;  Location: [place] [facility];  Service: Cardiovascular;  Laterality: N/A;  . LOOP RECORDER INSERTION  05/17/2018   Procedure: LOOP RECORDER INSERTION;  Surgeon: Fay Records, MD;  Location: [place];  Service: Cardiovascular;;  . TEE WITHOUT CARDIOVERSION N/A 05/17/2018   Procedure: TRANSESOPHAGEAL ECHOCARDIOGRAM (TEE);  Surgeon: Fay Records, MD;  Location: [place];  Service: Cardiovascular;  Laterality: N/A;  .  TRIGGER FINGER RELEASE    . TUMOR REMOVAL      Social History:  Social History   Socioeconomic History  . Marital status: Unknown    Spouse name: Not on file  . Number of children: Not on file  . Years of education: Not on file  . Highest education level: Not on file  Occupational History  . Not on file  Social Needs  . Financial resource strain: Not on file  . Food  insecurity:    Worry: Not on file    Inability: Not on file  . Transportation needs:    Medical: Not on file    Non-medical: Not on file  Tobacco Use  . Smoking status: Former Research scientist (life sciences)  . Smokeless tobacco: Never Used  Substance and Sexual Activity  . Alcohol use: No  . Drug use: Not on file  . Sexual activity: Not on file  Lifestyle  . Physical activity:    Days per week: Not on file    Minutes per session: Not on file  . Stress: Not on file  Relationships  . Social connections:    Talks on phone: Not on file    Gets together: Not on file    Attends religious service: Not on file    Active member of club or organization: Not on file    Attends meetings of clubs or organizations: Not on file    Relationship status: Not on file  . Intimate partner violence:    Fear of current or ex partner: Not on file    Emotionally abused: Not on file    Physically abused: Not on file    Forced sexual activity: Not on file  Other Topics Concern  . Not on file  Social History Narrative  . Not on file    Family History:  Family History  Problem Relation Age of Onset  . Breast cancer Mother   . Diabetes Mother   . Thyroid disease Mother   . Heart disease Father   . Hypertension Sister   . Thyroid disease Sister   . Diabetes Brother   . Hypertension Brother     Medications:   Current Outpatient Medications on File Prior to Visit  Medication Sig Dispense Refill  . apixaban (ELIQUIS) 5 MG TABS tablet Take 1 tablet (5 mg total) by mouth 2 (two) times daily. 60 tablet 0  . atorvastatin (LIPITOR) 40 MG tablet Take 1 tablet (40 mg total) by mouth daily at 6 PM. 30 tablet 3  . azelastine (ASTELIN) 0.1 % nasal spray Place 2 sprays into the nose 2 (two) times daily.    . Cholecalciferol (VITAMIN D) 2000 units CAPS Take 1 capsule by mouth daily.    . Multiple Vitamins-Minerals (ICAPS AREDS 2 PO) Take 1 capsule by mouth daily.    . Triamcinolone Acetonide (NASACORT AQ NA) Place into the nose  as directed.    . diclofenac sodium (VOLTAREN) 1 % GEL Apply 2 g topically 4 (four) times daily as needed (knee pain).     No current facility-administered medications on file prior to visit.     Allergies:  No Known Allergies   Physical Exam  Vitals:   12/21/18 1449  BP: 130/61  Pulse: (!) 49  Weight: 169 lb 12.8 oz (77 kg)  Height: 5\' 6"  (1.676 m)   Body mass index is 27.41 kg/m. No exam data present  General: well developed, well nourished, pleasant elderly Caucasian female, seated, in no evident distress Head:  head normocephalic and atraumatic.   Neck: supple with no carotid or supraclavicular bruits Cardiovascular: regular rate and rhythm, no murmurs Musculoskeletal: no deformity Skin:  no rash/petichiae Vascular:  Normal pulses all extremities  Neurologic Exam Mental Status: Awake and fully alert. Oriented to place and time. Recent and remote memory intact. Attention span, concentration and fund of knowledge appropriate. Mood and affect appropriate.  Cranial Nerves: Pupils equal, briskly reactive to light. Extraocular movements full without nystagmus. Visual fields full to confrontation. Hearing intact. Facial sensation intact. Face, tongue, palate moves normally and symmetrically.  Motor: Normal bulk and tone. Normal strength in all tested extremity muscles. Sensory.: intact to touch , pinprick , position and vibratory sensation.  Coordination: Rapid alternating movements normal in all extremities. Finger-to-nose and heel-to-shin performed accurately bilaterally. Gait and Station: Arises from chair without difficulty. Stance is normal. Gait demonstrates normal stride length and balance . Able to heel, toe and tandem walk without difficulty.  Reflexes: 1+ and symmetric. Toes downgoing.       Diagnostic Data (Labs, Imaging, Testing)  CT HEAD WO CONTRAST 05/15/18 IMPRESSION: 1. Negative for large vessel territorial infarct, hemorrhage or mass. 2. Small focal  hypodensity in the left sub insular region, possible age indeterminate lacunar infarct.  CT ANGIO HEAD/NECK W OR WO CONTRAST 05/16/2018 IMPRESSION: Normal CTA of the head and neck. No large vessel occlusion. No high-grade or correctable stenosis  MR BRAIN WO CONTRAST 05/16/18 IMPRESSION: 1. Two punctate foci of acute/early subacute infarction are present within the left posterior temporal lobe. No associated hemorrhage or mass effect. 2. Mild for age chronic microvascular ischemic changes and parenchymal volume loss of the brain.     ASSESSMENT: Vonette Grosso is a 78 y.o. 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 Vascular risk factors include HLD and HTN.  Atrial fibrillation found on loop recorder and initiate Eliquis.  She returns today for follow-up visit continues to do well from a stroke standpoint without residual deficits or recurring of symptoms.    PLAN: -Continue Eliquis (apixaban) daily  and lipitor  for secondary stroke prevention -F/u with PCP regarding your HLD and HTN management -f/u with cardiologist regarding atrial fibrillation and Eliquis management -Referral sent to ophthalmology due to continued dry eyes with patient reporting blurred vision -continue to monitor BP at home -Patient was advised to continue to stay active and maintain a healthy diet -Maintain strict control of hypertension with blood pressure goal below 130/90, diabetes with hemoglobin A1c goal below 6.5% and cholesterol with LDL cholesterol (bad cholesterol) goal below 70 mg/dL. I also advised the patient to eat a healthy diet with plenty of whole grains, cereals, fruits and vegetables, exercise regularly and maintain ideal body weight.  Follow up in 6 months or call earlier if needed   Greater than 50% of time during this 25 minute visit was spent on counseling,explanation of diagnosis of left posterior temporal lobe infarct, reviewing risk factor  management of HLD and HTN, planning of further management, discussion with patient and family and coordination of care    Venancio Poisson, [facility]  [facility] [address] [place], [place] [address]  Phone [phone] Fax [phone]

## 2018-12-22 ENCOUNTER — Encounter: Payer: Self-pay | Admitting: Adult Health

## 2018-12-23 NOTE — Progress Notes (Signed)
I agree with the above plan 

## 2019-01-03 ENCOUNTER — Ambulatory Visit (INDEPENDENT_AMBULATORY_CARE_PROVIDER_SITE_OTHER): Payer: Medicare Other

## 2019-01-03 DIAGNOSIS — I639 Cerebral infarction, unspecified: Secondary | ICD-10-CM

## 2019-01-04 LAB — CUP PACEART REMOTE DEVICE CHECK
Implantable Pulse Generator Implant Date: 20190624
MDC IDC SESS DTM: 20200210234058

## 2019-01-07 ENCOUNTER — Telehealth: Payer: Self-pay

## 2019-01-07 NOTE — Telephone Encounter (Signed)
Letter received from [employer] stating that they have approved the pt for pt asst with her Eliquis. Approval good from 01/05/2019 until 11/24/2019.  The letter states that they have notified the pt.

## 2019-01-12 DIAGNOSIS — H04123 Dry eye syndrome of bilateral lacrimal glands: Secondary | ICD-10-CM | POA: Diagnosis not present

## 2019-01-12 DIAGNOSIS — Z961 Presence of intraocular lens: Secondary | ICD-10-CM | POA: Diagnosis not present

## 2019-01-12 DIAGNOSIS — H524 Presbyopia: Secondary | ICD-10-CM | POA: Diagnosis not present

## 2019-01-12 DIAGNOSIS — H5212 Myopia, left eye: Secondary | ICD-10-CM | POA: Diagnosis not present

## 2019-01-12 DIAGNOSIS — H43393 Other vitreous opacities, bilateral: Secondary | ICD-10-CM | POA: Diagnosis not present

## 2019-01-12 DIAGNOSIS — H5051 Esophoria: Secondary | ICD-10-CM | POA: Diagnosis not present

## 2019-01-12 DIAGNOSIS — H43813 Vitreous degeneration, bilateral: Secondary | ICD-10-CM | POA: Diagnosis not present

## 2019-01-13 NOTE — Progress Notes (Signed)
[facility] Summary Report / Loop Recorder 

## 2019-01-24 ENCOUNTER — Other Ambulatory Visit: Payer: Self-pay

## 2019-01-24 ENCOUNTER — Emergency Department (HOSPITAL_COMMUNITY)
Admission: EM | Admit: 2019-01-24 | Discharge: 2019-01-24 | Disposition: A | Discharge status: Home / Self Care | Payer: Medicare Other | Attending: Emergency Medicine | Admitting: Emergency Medicine

## 2019-01-24 DIAGNOSIS — I480 Paroxysmal atrial fibrillation: Secondary | ICD-10-CM | POA: Diagnosis not present

## 2019-01-24 DIAGNOSIS — Z87891 Personal history of nicotine dependence: Secondary | ICD-10-CM | POA: Insufficient documentation

## 2019-01-24 DIAGNOSIS — Z8673 Personal history of transient ischemic attack (TIA), and cerebral infarction without residual deficits: Secondary | ICD-10-CM | POA: Diagnosis not present

## 2019-01-24 DIAGNOSIS — Z7901 Long term (current) use of anticoagulants: Secondary | ICD-10-CM | POA: Insufficient documentation

## 2019-01-24 DIAGNOSIS — Z79899 Other long term (current) drug therapy: Secondary | ICD-10-CM | POA: Insufficient documentation

## 2019-01-24 DIAGNOSIS — I10 Essential (primary) hypertension: Secondary | ICD-10-CM | POA: Diagnosis not present

## 2019-01-24 DIAGNOSIS — R21 Rash and other nonspecific skin eruption: Secondary | ICD-10-CM | POA: Diagnosis not present

## 2019-01-24 LAB — CBC WITH DIFFERENTIAL/PLATELET
Abs Immature Granulocytes: 0.02 10*3/uL (ref 0.00–0.07)
BASOS ABS: 0.1 10*3/uL (ref 0.0–0.1)
BASOS PCT: 1 %
Eosinophils Absolute: 0.1 10*3/uL (ref 0.0–0.5)
Eosinophils Relative: 1 %
HCT: 43.7 % (ref 36.0–46.0)
Hemoglobin: 14.1 g/dL (ref 12.0–15.0)
IMMATURE GRANULOCYTES: 0 %
Lymphocytes Relative: 22 %
Lymphs Abs: 1.4 10*3/uL (ref 0.7–4.0)
MCH: 29.6 pg (ref 26.0–34.0)
MCHC: 32.3 g/dL (ref 30.0–36.0)
MCV: 91.8 fL (ref 80.0–100.0)
MONOS PCT: 7 %
Monocytes Absolute: 0.5 10*3/uL (ref 0.1–1.0)
NEUTROS ABS: 4.3 10*3/uL (ref 1.7–7.7)
NEUTROS PCT: 69 %
NRBC: 0 % (ref 0.0–0.2)
PLATELETS: 257 10*3/uL (ref 150–400)
RBC: 4.76 MIL/uL (ref 3.87–5.11)
RDW: 12.6 % (ref 11.5–15.5)
WBC: 6.3 10*3/uL (ref 4.0–10.5)

## 2019-01-24 LAB — BASIC METABOLIC PANEL
ANION GAP: 10 (ref 5–15)
BUN: 16 mg/dL (ref 8–23)
CALCIUM: 10.9 mg/dL — AB (ref 8.9–10.3)
CO2: 24 mmol/L (ref 22–32)
Chloride: 105 mmol/L (ref 98–111)
Creatinine, Ser: 0.85 mg/dL (ref 0.44–1.00)
Glucose, Bld: 152 mg/dL — ABNORMAL HIGH (ref 70–99)
Potassium: 3.6 mmol/L (ref 3.5–5.1)
SODIUM: 139 mmol/L (ref 135–145)

## 2019-01-24 LAB — I-STAT TROPONIN, ED: TROPONIN I, POC: 0 ng/mL (ref 0.00–0.08)

## 2019-01-24 MED ORDER — DILTIAZEM HCL 25 MG/5ML IV SOLN
10.0000 mg | Freq: Once | INTRAVENOUS | Status: AC
  Administered 2019-01-24: 10 mg via INTRAVENOUS
  Filled 2019-01-24: qty 5

## 2019-01-24 NOTE — ED Notes (Signed)
Pt given water to drink. 

## 2019-01-24 NOTE — ED Provider Notes (Addendum)
[facility] EMERGENCY DEPARTMENT Provider Note   CSN: [id number] Arrival date & time: 01/24/19  4270    History   Chief Complaint Chief Complaint  Patient presents with  . Allergic Reaction  . Atrial Fibrillation    HPI Kristin Lowe is a 78 y.o. female w PMHx PAF on eliquis, stroke, HTH, presenting to the ED with complaint of allergic reaction and atrial fibrillation. Pt states this morning after she had finished eating her bowel of raisin bran cereal, she began feeling very flushed with redness to her face, arms, back and ankle. She states her skin felt very hot and itchy, almost burning. She called EMS with suspicion of having an allergic reaction. She states they noted her to be in a fib. She states she decided to come POV to the ED. She states maybe her lips felt a little swollen, however denies tongue swelling, shortness of breath, throat symptoms. No obvious hives. No cp or SOB. States she felt like her heart was throbbing briefly as well. Feels a little fatigued, however attributes this to waking up early to get work done. No new medications. No new foods or personal products. No known food allergies. She is complaint with eliquis. Does have a loop recorder.      The history is provided by the patient.    Past Medical History:  Diagnosis Date  . Bradycardia   . Hypertension   . Paroxysmal atrial fibrillation (HCC)   . Stroke (cerebrum) [facility])     Patient Active Problem List   Diagnosis Date Noted  . TIA (transient ischemic attack) 05/15/2018  . Sinus bradycardia 05/15/2018  . Essential hypertension 05/15/2018  . Hyperglycemia 05/15/2018  . Multiple thyroid nodules 09/17/2017  . Other fatigue 09/17/2017    Past Surgical History:  Procedure Laterality Date  . CATARACT EXTRACTION, BILATERAL    . LOOP RECORDER INSERTION N/A 05/17/2018   Procedure: LOOP RECORDER INSERTION;  Surgeon: Deboraha Sprang, MD;  Location: [facility];  Service:  Cardiovascular;  Laterality: N/A;  . LOOP RECORDER INSERTION  05/17/2018   Procedure: LOOP RECORDER INSERTION;  Surgeon: Fay Records, MD;  Location: [facility];  Service: Cardiovascular;;  . TEE WITHOUT CARDIOVERSION N/A 05/17/2018   Procedure: TRANSESOPHAGEAL ECHOCARDIOGRAM (TEE);  Surgeon: Fay Records, MD;  Location: [facility];  Service: Cardiovascular;  Laterality: N/A;  . TRIGGER FINGER RELEASE    . TUMOR REMOVAL       OB History   No obstetric history on file.      Home Medications    Prior to Admission medications   Medication Sig Start Date End Date Taking? Authorizing Provider  apixaban (ELIQUIS) 5 MG TABS tablet Take 1 tablet (5 mg total) by mouth 2 (two) times daily. 06/08/18  Yes Sherran Needs, NP  atorvastatin (LIPITOR) 40 MG tablet Take 1 tablet (40 mg total) by mouth daily at 6 PM. 05/17/18  Yes Danford, Suann Larry, MD  azelastine (ASTELIN) 0.1 % nasal spray Place 2 sprays into the nose 2 (two) times daily.   Yes [provider]  Cholecalciferol (VITAMIN D) 2000 units CAPS Take 2,000 Units by mouth daily.    Yes [provider]  Multiple Vitamin (MULTIVITAMIN) tablet Take 1 tablet by mouth daily.   Yes [provider]  Triamcinolone Acetonide (NASACORT AQ NA) Place 1 spray into both nostrils daily as needed (allergies).    Yes [provider]  TURMERIC PO Take 1 capsule by mouth daily.  Yes [provider]    Family History Family History  Problem Relation Age of Onset  . Breast cancer Mother   . Diabetes Mother   . Thyroid disease Mother   . Heart disease Father   . Hypertension Sister   . Thyroid disease Sister   . Diabetes Brother   . Hypertension Brother     Social History Social History   Tobacco Use  . Smoking status: Former Research scientist (life sciences)  . Smokeless tobacco: Never Used  Substance Use Topics  . Alcohol use: No  . Drug use: Not on file     Allergies   Patient has no known allergies.   Review  of Systems Review of Systems  Constitutional: Positive for fatigue.  HENT: Positive for facial swelling.   Respiratory: Negative for shortness of breath.   Cardiovascular: Negative for chest pain.  Gastrointestinal: Negative for abdominal pain and vomiting.  Skin: Positive for rash.  All other systems reviewed and are negative.    Physical Exam Updated Vital Signs BP 124/71   Pulse 85   Temp 98.4 F (36.9 C) (Oral)   Resp 18   Ht 5' 6.5" (1.689 m)   Wt 74.4 kg   SpO2 97%   BMI 26.07 kg/m   Physical Exam Vitals signs and nursing note reviewed.  Constitutional:      General: She is not in acute distress.    Appearance: She is well-developed. She is not ill-appearing.  HENT:     Head: Normocephalic and atraumatic.     Mouth/Throat:     Mouth: Mucous membranes are moist.     Comments:  Lower lip appears slightly red and with questionable slight swelling. No tongue swelling. Tolerating secretions. Eyes:     Conjunctiva/sclera: Conjunctivae normal.  Neck:     Musculoskeletal: Normal range of motion and neck supple.  Cardiovascular:     Rate and Rhythm: Tachycardia present. Rhythm irregular.  Pulmonary:     Effort: Pulmonary effort is normal. No respiratory distress.     Breath sounds: Normal breath sounds.  Abdominal:     General: Bowel sounds are normal. There is no distension.     Palpations: Abdomen is soft.     Tenderness: There is no abdominal tenderness.  Musculoskeletal:     Right lower leg: No edema.     Left lower leg: No edema.  Skin:    General: Skin is warm.     Comments: Slight erythema present to medial aspect BUE. Nontender. No wheals.  Neurological:     Mental Status: She is alert.  Psychiatric:        Behavior: Behavior normal.      ED Treatments / Results  Labs (all labs ordered are listed, but only abnormal results are displayed) Labs Reviewed  BASIC METABOLIC PANEL - Abnormal; Notable for the following components:      Result Value    Glucose, Bld 152 (*)    Calcium 10.9 (*)    All other components within normal limits  CBC WITH DIFFERENTIAL/PLATELET  I-STAT TROPONIN, ED    EKG EKG Interpretation  Date/Time:  Monday January 24 2019 09:54:40 EST Ventricular Rate:  107 PR Interval:    QRS Duration: 92 QT Interval:  342 QTC Calculation: 457 R Axis:   -65 Text Interpretation:  Atrial fibrillation Left anterior fascicular block Nonspecific ST abnormality Confirmed by Lajean Saver [phone]) on 01/24/2019 9:59:40 AM   Radiology No results found.  Procedures Procedures (including critical care time)  Medications  Ordered in ED Medications  diltiazem (CARDIZEM) injection 10 mg (10 mg Intravenous Given 01/24/19 1112)     Initial Impression / Assessment and Plan / ED Course  I have reviewed the triage vital signs and the nursing notes.  Pertinent labs & imaging results that were available during my care of the patient were reviewed by me and considered in my medical decision making (see chart for details).   CHA2DS2/VAS Stroke Risk Points  Current as of 2 days ago (Wednesday)     6 >= 2 Points: High Risk  1 - 1.99 Points: Medium Risk  0 Points: Low Risk    The patient's score has not changed in the past year.:  No Change     Details    This score determines the patient's risk of having a stroke if the  patient has atrial fibrillation.     Points Metrics  0 Has Congestive Heart Failure:  No    Current as of 2 days ago (Wednesday)  0 Has Vascular Disease:  No    Current as of 2 days ago (Wednesday)  1 Has Hypertension:  Yes    Current as of 2 days ago (Wednesday)  2 Age:  89    Current as of 2 days ago (Wednesday)  0 Has Diabetes:  No    Current as of 2 days ago (Wednesday)  2 Had Stroke:  Yes  Had TIA:  Yes  Had thromboembolism:  No    Current as of 2 days ago (Wednesday)  1 Female:  Yes    Current as of 2 days ago (Wednesday)          Patient presenting with an episode of flushing of her skin this  morning after eating a bowl of cereal, which she suspected to be allergic reaction to unknown source.  No symptoms of anaphylaxis.  Patient called out and was found to be in atrial fibrillation, however came to the ED by private vehicle.  Upon arrival, patient in atrial fibrillation with rate in the low 100s.  Exam is otherwise unremarkable.  She is compliant with Eliquis.  Labs are very reassuring, troponin within normal limits.  CBC and BMP unremarkable.  Patient monitored for greater than 3 hours without recurrence of rash symptoms.  Heart rate treated with dose of Cardizem with improvement.  Patient is asymptomatic and feels she is ready for discharge.  Had shared decision making regarding medication for rate control, however patient declined and will follow closely with her cardiologist.  Discussed strict return precautions.  Agreeable to plan and safe for discharge.  Discussed with and evaluated by Dr. Ashok Cordia, who guided treatment agrees with care plan for discharge.  Discussed results, findings, treatment and follow up. Patient advised of return precautions. Patient verbalized understanding and agreed with plan.   Final Clinical Impressions(s) / ED Diagnoses   Final diagnoses:  Paroxysmal atrial fibrillation [facility])  Rash    ED Discharge Orders    None        Wenzlick, [place] N, PA-C 01/24/19 1528    Lajean Saver, MD 01/25/19 1613    Daryel Kenneth, [place] N, PA-C 01/28/19 1415    Lajean Saver, MD 01/28/19 [phone]

## 2019-01-24 NOTE — ED Triage Notes (Signed)
Pt reports that she was eating breakfast this morning and then began to feel "hot and itchy." Pt reports that she was red on her arms and face "like a sunburn." Pt reports that she thinks her lips were swollen when this occurred. Pt reports that she does feel "hoarse" but denied any tongue swelling. Pt heart rate controlled upon arrival to ED. Pt denies any chest pain, shortness of breath, nausea or vomiting.

## 2019-01-24 NOTE — ED Notes (Signed)
Pt ambulated well in hallway. Pt reports some dizziness when first standing up but denies any other complaints. Heart rate increased to 107 with ambulation.

## 2019-01-24 NOTE — Discharge Instructions (Addendum)
Follow-up closely with your cardiologist to discuss your visit today.

## 2019-02-07 ENCOUNTER — Encounter: Payer: Self-pay | Admitting: Internal Medicine

## 2019-02-07 ENCOUNTER — Other Ambulatory Visit: Payer: Self-pay

## 2019-02-07 ENCOUNTER — Ambulatory Visit (INDEPENDENT_AMBULATORY_CARE_PROVIDER_SITE_OTHER): Payer: Medicare Other | Admitting: *Deleted

## 2019-02-07 DIAGNOSIS — I639 Cerebral infarction, unspecified: Secondary | ICD-10-CM | POA: Diagnosis not present

## 2019-02-08 ENCOUNTER — Telehealth: Payer: Self-pay | Admitting: Internal Medicine

## 2019-02-08 ENCOUNTER — Telehealth: Payer: Self-pay

## 2019-02-08 LAB — CUP PACEART REMOTE DEVICE CHECK
Date Time Interrogation Session: 20200315001046
Implantable Pulse Generator Implant Date: 20190624

## 2019-02-08 MED ORDER — VERAPAMIL HCL 40 MG PO TABS: ORAL_TABLET | ORAL | 0 refills | Status: DC

## 2019-02-08 NOTE — Telephone Encounter (Signed)
Called pt to assess her needs. She recently visited the ED for a suspected allergic reaction but was found to be in Afib. She was given IM Diltiazem in the ED and sent home. She was asked to follow up with [place].   She states she is feeling well and has no complaints today. I have asked her to send in a remote check so Dr [name] can assess her episodes of Afib.    She agrees to cancel her appointment next week, but will require a call from Dr [name] for further assessment.

## 2019-02-08 NOTE — Telephone Encounter (Signed)
Order placed for verapamil, 40mg  PRN once daily for episodes of Afib RVR.

## 2019-02-08 NOTE — Addendum Note (Signed)
Addended by: Dollene Primrose on: 02/08/2019 05:24 PM   Modules accepted: Orders

## 2019-02-08 NOTE — Telephone Encounter (Signed)
See recent AF trends below:

## 2019-02-08 NOTE — Telephone Encounter (Signed)
Called and spoke to pt regarding her recent visit to ER  Currently symptoms are resolved and we will see patient   Discussed rescheduling of the appt  8-10 weeks from now to discuss pacing  Pt is agreeable and advised to call if interval problems    Will Rx verapamil 40 mg use x 1 as needed for AF RVR

## 2019-02-14 NOTE — Progress Notes (Signed)
[facility] Summary Report / Loop Recorder 

## 2019-02-16 ENCOUNTER — Ambulatory Visit: Payer: Medicare Other | Admitting: Internal Medicine

## 2019-02-22 ENCOUNTER — Telehealth: Payer: Self-pay

## 2019-02-22 NOTE — Telephone Encounter (Signed)
I called and left patient a message about setting up virtual visit for tomorrow or Thursday with Dr. [name] for hospital follow up.

## 2019-02-22 NOTE — Telephone Encounter (Signed)
Follow up:    patient returning call back. Please call patient.

## 2019-02-22 NOTE — Telephone Encounter (Signed)
I called and spoke with patient, she states that she spoke with Dr. [name] on 02/08/19 and was prescribed Verapamil. Patient states that she is doing well, 3 month recall entered per [place] and Dr. [name]. Patient aware to follow up in about 3 months.

## 2019-03-10 ENCOUNTER — Other Ambulatory Visit: Payer: Self-pay

## 2019-03-10 ENCOUNTER — Ambulatory Visit (INDEPENDENT_AMBULATORY_CARE_PROVIDER_SITE_OTHER): Payer: Medicare Other | Admitting: *Deleted

## 2019-03-10 DIAGNOSIS — I639 Cerebral infarction, unspecified: Secondary | ICD-10-CM | POA: Diagnosis not present

## 2019-03-11 LAB — CUP PACEART REMOTE DEVICE CHECK
Date Time Interrogation Session: 20200417004010
Implantable Pulse Generator Implant Date: 20190624

## 2019-03-16 NOTE — Progress Notes (Signed)
[facility] Summary Report / Loop Recorder 

## 2019-03-29 DIAGNOSIS — L820 Inflamed seborrheic keratosis: Secondary | ICD-10-CM | POA: Diagnosis not present

## 2019-03-29 DIAGNOSIS — L570 Actinic keratosis: Secondary | ICD-10-CM | POA: Diagnosis not present

## 2019-04-12 ENCOUNTER — Ambulatory Visit (INDEPENDENT_AMBULATORY_CARE_PROVIDER_SITE_OTHER): Payer: Medicare Other | Admitting: *Deleted

## 2019-04-12 ENCOUNTER — Other Ambulatory Visit: Payer: Self-pay

## 2019-04-12 DIAGNOSIS — I639 Cerebral infarction, unspecified: Secondary | ICD-10-CM | POA: Diagnosis not present

## 2019-04-13 LAB — CUP PACEART REMOTE DEVICE CHECK
Date Time Interrogation Session: 20200520013832
Implantable Pulse Generator Implant Date: 20190624

## 2019-04-19 ENCOUNTER — Telehealth: Payer: Self-pay | Admitting: Internal Medicine

## 2019-04-19 NOTE — Telephone Encounter (Signed)
Pt calling to discuss a new multivitamin she is taking. She wanted to be sure it was compatible with her Eliquis. She could not tell me exactly what was in the vitamin and I advised her I would not know exactly what was or was not compatible with her Eliquis within the vitamin.   I advised pt I would forward to our pharmacist as they may be better to advise her on adverse reactions regarding Eliquis and a special vitamin. She verbalized understanding and will await contact from our anticoagulation clinic.

## 2019-04-19 NOTE — Telephone Encounter (Signed)
Returned call to patient and spent 20 minutes on the phone with pt. She states she is using [employer] products, including a multivitamin, heart health supplement, and immune support. Advised pt to use the immune support supplement as needed at the onset of a cold since otherwise, she would be taking 700% recommended daily value of Vitamin C between the 3 products. Ok to use the multivitamin daily, and advised her the heart health one does not have data to support its benefit but it should not cause any harm. No interactions with her current meds. She verbalized understanding.

## 2019-04-19 NOTE — Telephone Encounter (Signed)
  Patient is calling to ask some questions about an immune system program she is considering starting

## 2019-04-21 NOTE — Progress Notes (Signed)
[facility] Summary Report / Loop Recorder 

## 2019-04-26 ENCOUNTER — Other Ambulatory Visit: Payer: Self-pay | Admitting: Internal Medicine

## 2019-04-26 DIAGNOSIS — Z1231 Encounter for screening mammogram for malignant neoplasm of breast: Secondary | ICD-10-CM

## 2019-05-13 DIAGNOSIS — M2042 Other hammer toe(s) (acquired), left foot: Secondary | ICD-10-CM | POA: Diagnosis not present

## 2019-05-13 DIAGNOSIS — M2041 Other hammer toe(s) (acquired), right foot: Secondary | ICD-10-CM | POA: Diagnosis not present

## 2019-05-16 ENCOUNTER — Ambulatory Visit (INDEPENDENT_AMBULATORY_CARE_PROVIDER_SITE_OTHER): Payer: Medicare Other | Admitting: *Deleted

## 2019-05-16 DIAGNOSIS — Z01419 Encounter for gynecological examination (general) (routine) without abnormal findings: Secondary | ICD-10-CM | POA: Diagnosis not present

## 2019-05-16 DIAGNOSIS — G459 Transient cerebral ischemic attack, unspecified: Secondary | ICD-10-CM

## 2019-05-16 DIAGNOSIS — Z124 Encounter for screening for malignant neoplasm of cervix: Secondary | ICD-10-CM | POA: Diagnosis not present

## 2019-05-17 LAB — CUP PACEART REMOTE DEVICE CHECK
Date Time Interrogation Session: 20200622021149
Implantable Pulse Generator Implant Date: 20190624

## 2019-05-20 DIAGNOSIS — L292 Pruritus vulvae: Secondary | ICD-10-CM | POA: Diagnosis not present

## 2019-05-20 DIAGNOSIS — N904 Leukoplakia of vulva: Secondary | ICD-10-CM | POA: Diagnosis not present

## 2019-05-23 NOTE — Progress Notes (Signed)
[facility] Summary Report / Loop Recorder 

## 2019-06-17 ENCOUNTER — Ambulatory Visit (INDEPENDENT_AMBULATORY_CARE_PROVIDER_SITE_OTHER): Payer: Medicare Other | Admitting: *Deleted

## 2019-06-17 DIAGNOSIS — G459 Transient cerebral ischemic attack, unspecified: Secondary | ICD-10-CM | POA: Diagnosis not present

## 2019-06-18 LAB — CUP PACEART REMOTE DEVICE CHECK
Date Time Interrogation Session: 20200725020938
Implantable Pulse Generator Implant Date: 20190624

## 2019-06-21 DIAGNOSIS — L900 Lichen sclerosus et atrophicus: Secondary | ICD-10-CM | POA: Diagnosis not present

## 2019-06-22 ENCOUNTER — Ambulatory Visit (INDEPENDENT_AMBULATORY_CARE_PROVIDER_SITE_OTHER): Payer: Medicare Other | Admitting: Adult Health

## 2019-06-22 ENCOUNTER — Encounter: Payer: Self-pay | Admitting: Adult Health

## 2019-06-22 ENCOUNTER — Other Ambulatory Visit: Payer: Self-pay

## 2019-06-22 VITALS — BP 131/68 | HR 52 | Temp 97.7°F | Ht 65.5 in | Wt 166.6 lb

## 2019-06-22 DIAGNOSIS — I6389 Other cerebral infarction: Secondary | ICD-10-CM | POA: Diagnosis not present

## 2019-06-22 DIAGNOSIS — I10 Essential (primary) hypertension: Secondary | ICD-10-CM

## 2019-06-22 DIAGNOSIS — E785 Hyperlipidemia, unspecified: Secondary | ICD-10-CM

## 2019-06-22 DIAGNOSIS — I480 Paroxysmal atrial fibrillation: Secondary | ICD-10-CM | POA: Diagnosis not present

## 2019-06-22 NOTE — Progress Notes (Signed)
[facility] [address] [place]. [place] [address] [phone]       OFFICE FOLLOW UP NOTE  [name] Date of Birth:  1940-12-07 Medical Record Number:  [id number]   Reason for Referral:  hospital stroke follow up  CHIEF COMPLAINT:  Chief Complaint  Patient presents with  . Follow-up    Room 9, alone. Stroke f/u. States that she is doing well.    HPI: 06/22/19 update Kristin Lowe is a 78 year old female who is being seen today for stroke follow-up.  She has been stable from a neurological and stroke standpoint without residual deficits or reoccurring symptoms.  Continues on Eliquis and atorvastatin for secondary stroke prevention without reported side effects.  Blood pressure 131/68. No further concerns at this time.    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  She continues to do well from a stroke standpoint without residual deficits or recurring of symptoms.  Continues on Eliquis for atrial fibrillation and secondary stroke prevention without side effects of bleeding or bruising.  Continues on atorvastatin without side effects myalgias. She did have cholesterol levels checked by her PCP since prior appt and per patient, satisfactory levels. Blood pressure today 130/61.  She does have concerns of dry eyes which at times can cause blurry vision.  She states that she has attempted to use numerous different OTC drops without benefit.  She does have a history of cataracts procedure and has tubes in her tear ducts.  She does have a history of allergies and feels as though this could be due to her allergies.  She is questioning whether there are ophthalmologists that specialize in conditions when eyes are affected by allergies.  Denies new or worsening stroke/TIA symptoms.   History summary:  Kristin Lowe  is a 78 y.o. 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  MRI head showed 2 punctate areas of acute infarct in the left posterior temporal lobe.  CTA head and neck negative.  2D echo showed normal EF without cardiac source of embolus.  TEE was negative for PFO or SOE.  Loop recorder placed to rule atrial fibrillation due to cryptogenic etiology.  Initiated DAPT for 3 weeks and Plavix alone along with atorvastatin.  Discharged home in stable condition.  All imaging from hospital admission in detail below and personally reviewed. She was seen in the office post hospital discharge on 06/16/2018 without residual deficits or recurring of symptoms.  Loop recorder showed 2 episodes of AF on 05/25/2018 and discontinued DAPT and initiated Eliquis.  She continued on all prescribed medications without reported side effects.  Return back to all prior activities and stable from stroke standpoint.      ROS:   14 system review of systems performed and negative with exception of no complaints  PMH:  Past Medical History:  Diagnosis Date  . Bradycardia   . Hypertension   . Paroxysmal atrial fibrillation (HCC)   . Stroke (cerebrum) (HCC)     PSH:  Past Surgical History:  Procedure Laterality Date  . CATARACT EXTRACTION, BILATERAL    . LOOP RECORDER INSERTION N/A 05/17/2018   Procedure: LOOP RECORDER INSERTION;  Surgeon: Deboraha Sprang, MD;  Location: [place] [facility];  Service: Cardiovascular;  Laterality: N/A;  . LOOP RECORDER INSERTION  05/17/2018   Procedure: LOOP RECORDER INSERTION;  Surgeon: Fay Records, MD;  Location: [facility];  Service: Cardiovascular;;  . TEE WITHOUT CARDIOVERSION N/A 05/17/2018   Procedure: TRANSESOPHAGEAL ECHOCARDIOGRAM (TEE);  Surgeon: Fay Records, MD;  Location: [facility];  Service: Cardiovascular;  Laterality: N/A;  . TRIGGER FINGER RELEASE    . TUMOR REMOVAL       Social History:  Social History   Socioeconomic History  . Marital status: Single    Spouse name: Not on file  . Number of children: Not on file  . Years of education: Not on file  . Highest education level: Not on file  Occupational History  . Not on file  Social Needs  . Financial resource strain: Not on file  . Food insecurity    Worry: Not on file    Inability: Not on file  . Transportation needs    Medical: Not on file    Non-medical: Not on file  Tobacco Use  . Smoking status: Former Research scientist (life sciences)  . Smokeless tobacco: Never Used  Substance and Sexual Activity  . Alcohol use: No  . Drug use: Never  . Sexual activity: Not on file  Lifestyle  . Physical activity    Days per week: Not on file    Minutes per session: Not on file  . Stress: Not on file  Relationships  . Social Herbalist on phone: Not on file    Gets together: Not on file    Attends religious service: Not on file    Active member of club or organization: Not on file    Attends meetings of clubs or organizations: Not on file    Relationship status: Not on file  . Intimate partner violence    Fear of current or ex partner: Not on file    Emotionally abused: Not on file    Physically abused: Not on file    Forced sexual activity: Not on file  Other Topics Concern  . Not on file  Social History Narrative  . Not on file    Family History:  Family History  Problem Relation Age of Onset  . Breast cancer Mother   . Diabetes Mother   . Thyroid disease Mother   . Heart disease Father   . Hypertension Sister   . Thyroid disease Sister   . Diabetes Brother   . Hypertension Brother     Medications:   Current Outpatient Medications on File Prior to Visit  Medication Sig Dispense Refill  . apixaban (ELIQUIS) 5 MG TABS tablet Take 1 tablet (5 mg total) by mouth 2 (two) times daily. 60 tablet 0  . atorvastatin (LIPITOR) 40 MG tablet Take 1 tablet (40 mg total) by mouth daily at 6 PM. 30 tablet 3   . azelastine (ASTELIN) 0.1 % nasal spray Place 2 sprays into the nose 2 (two) times daily.    . Triamcinolone Acetonide (NASACORT AQ NA) Place 1 spray into both nostrils daily as needed (allergies).     . TURMERIC PO Take 1 capsule by mouth daily.    . verapamil (CALAN) 40 MG tablet Take 40mg , one tablet, up to one time daily for episodes of A Fib RVR. 15 tablet 0  . Cholecalciferol (VITAMIN D) 2000 units CAPS Take 2,000 Units by mouth daily.     . Multiple Vitamin (MULTIVITAMIN) tablet Take 1 tablet by mouth daily.     No current facility-administered medications on file prior to visit.  Allergies:  No Known Allergies   Physical Exam  Vitals:   06/22/19 1235  BP: 131/68  Pulse: (!) 52  Temp: 97.7 F (36.5 C)  Weight: 166 lb 9.6 oz (75.6 kg)  Height: 5' 5.5" (1.664 m)   Body mass index is 27.3 kg/m. No exam data present  General: well developed, well nourished, pleasant elderly Caucasian female, seated, in no evident distress Head: head normocephalic and atraumatic.   Neck: supple with no carotid or supraclavicular bruits Cardiovascular: regular rate and rhythm, no murmurs Musculoskeletal: no deformity Skin:  no rash/petichiae Vascular:  Normal pulses all extremities  Neurologic Exam Mental Status: Awake and fully alert. Oriented to place and time. Recent and remote memory intact. Attention span, concentration and fund of knowledge appropriate. Mood and affect appropriate.  Cranial Nerves: Pupils equal, briskly reactive to light. Extraocular movements full without nystagmus. Visual fields full to confrontation. Hearing intact. Facial sensation intact. Face, tongue, palate moves normally and symmetrically.  Motor: Normal bulk and tone. Normal strength in all tested extremity muscles. Sensory.: intact to touch , pinprick , position and vibratory sensation.  Coordination: Rapid alternating movements normal in all extremities. Finger-to-nose and heel-to-shin performed  accurately bilaterally. Gait and Station: Arises from chair without difficulty. Stance is normal. Gait demonstrates normal stride length and balance . Able to heel, toe and tandem walk without difficulty.  Reflexes: 1+ and symmetric. Toes downgoing.       Diagnostic Data (Labs, Imaging, Testing)  CT HEAD WO CONTRAST 05/15/18 IMPRESSION: 1. Negative for large vessel territorial infarct, hemorrhage or mass. 2. Small focal hypodensity in the left sub insular region, possible age indeterminate lacunar infarct.  CT ANGIO HEAD/NECK W OR WO CONTRAST 05/16/2018 IMPRESSION: Normal CTA of the head and neck. No large vessel occlusion. No high-grade or correctable stenosis  MR BRAIN WO CONTRAST 05/16/18 IMPRESSION: 1. Two punctate foci of acute/early subacute infarction are present within the left posterior temporal lobe. No associated hemorrhage or mass effect. 2. Mild for age chronic microvascular ischemic changes and parenchymal volume loss of the brain.     ASSESSMENT: Kristin Lowe is a 78 y.o. 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 Vascular risk factors include HLD and HTN.  Atrial fibrillation found on loop recorder and initiate Eliquis.  Recovered well from a stroke standpoint without residual deficits.    PLAN: -Continue Eliquis (apixaban) daily  and lipitor  for secondary stroke prevention -F/u with PCP regarding your HLD and HTN management -f/u with cardiologist regarding atrial fibrillation and Eliquis management -continue to monitor BP at home -Patient was advised to continue to stay active and maintain a healthy diet -Maintain strict control of hypertension with blood pressure goal below 130/90, diabetes with hemoglobin A1c goal below 6.5% and cholesterol with LDL cholesterol (bad cholesterol) goal below 70 mg/dL. I also advised the patient to eat a healthy diet with plenty of whole grains, cereals, fruits and vegetables,  exercise regularly and maintain ideal body weight.  Stable from stroke standpoint recommend follow-up as needed   Greater than 50% of time during this 25 minute visit was spent on counseling,explanation of diagnosis of left posterior temporal lobe infarct, reviewing risk factor management of HLD and HTN, planning of further management, discussion with patient and family and coordination of care    Venancio Poisson, [facility]  [facility] [address] [place], [place] [address]  Phone [phone] Fax [phone]

## 2019-06-22 NOTE — Patient Instructions (Signed)
Continue Eliquis (apixaban) daily  and Lipitor for secondary stroke prevention  Continue to follow up with PCP regarding cholesterol and blood pressure management   Continue to follow with cardiology for Eliquis and atrial fibrillation management  Continue to stay active and maintain a healthy diet  Continue to monitor blood pressure at home  Maintain strict control of hypertension with blood pressure goal below 130/90, diabetes with hemoglobin A1c goal below 6.5% and cholesterol with LDL cholesterol (bad cholesterol) goal below 70 mg/dL. I also advised the patient to eat a healthy diet with plenty of whole grains, cereals, fruits and vegetables, exercise regularly and maintain ideal body weight.         Thank you for coming to see [place] at [facility]. I hope we have been able to provide you high quality care today.  You may receive a patient satisfaction survey over the next few weeks. We would appreciate your feedback and comments so that we may continue to improve ourselves and the health of our patients.

## 2019-06-23 NOTE — Progress Notes (Signed)
[facility] Summary Report / Loop Recorder 

## 2019-06-24 ENCOUNTER — Other Ambulatory Visit: Payer: Self-pay | Admitting: Nurse Practitioner

## 2019-06-24 NOTE — Progress Notes (Signed)
I agree with the above plan 

## 2019-06-24 NOTE — Telephone Encounter (Signed)
Age 78, weight 76kg, SCr 0.85, last OV 08/2018, afib indication

## 2019-07-20 ENCOUNTER — Ambulatory Visit (INDEPENDENT_AMBULATORY_CARE_PROVIDER_SITE_OTHER): Payer: Medicare Other | Admitting: *Deleted

## 2019-07-20 DIAGNOSIS — I6389 Other cerebral infarction: Secondary | ICD-10-CM | POA: Diagnosis not present

## 2019-07-20 DIAGNOSIS — I639 Cerebral infarction, unspecified: Secondary | ICD-10-CM

## 2019-07-21 LAB — CUP PACEART REMOTE DEVICE CHECK
Date Time Interrogation Session: 20200827023955
Implantable Pulse Generator Implant Date: 20190624

## 2019-07-28 NOTE — Progress Notes (Signed)
[facility] Summary Report / Loop Recorder 

## 2019-08-19 DIAGNOSIS — Z23 Encounter for immunization: Secondary | ICD-10-CM | POA: Diagnosis not present

## 2019-08-22 ENCOUNTER — Ambulatory Visit (INDEPENDENT_AMBULATORY_CARE_PROVIDER_SITE_OTHER): Payer: Medicare Other | Admitting: *Deleted

## 2019-08-22 DIAGNOSIS — G459 Transient cerebral ischemic attack, unspecified: Secondary | ICD-10-CM

## 2019-08-23 LAB — CUP PACEART REMOTE DEVICE CHECK
Date Time Interrogation Session: 20200929183837
Implantable Pulse Generator Implant Date: 20190624

## 2019-08-25 DIAGNOSIS — M859 Disorder of bone density and structure, unspecified: Secondary | ICD-10-CM | POA: Diagnosis not present

## 2019-08-25 DIAGNOSIS — I444 Left anterior fascicular block: Secondary | ICD-10-CM | POA: Diagnosis not present

## 2019-08-25 DIAGNOSIS — E041 Nontoxic single thyroid nodule: Secondary | ICD-10-CM | POA: Diagnosis not present

## 2019-08-25 DIAGNOSIS — I480 Paroxysmal atrial fibrillation: Secondary | ICD-10-CM | POA: Diagnosis not present

## 2019-08-25 DIAGNOSIS — M8580 Other specified disorders of bone density and structure, unspecified site: Secondary | ICD-10-CM | POA: Diagnosis not present

## 2019-08-25 DIAGNOSIS — Z79899 Other long term (current) drug therapy: Secondary | ICD-10-CM | POA: Diagnosis not present

## 2019-08-29 DIAGNOSIS — R82998 Other abnormal findings in urine: Secondary | ICD-10-CM | POA: Diagnosis not present

## 2019-08-30 NOTE — Progress Notes (Signed)
[facility] Summary Report / Loop Recorder 

## 2019-08-31 ENCOUNTER — Encounter (HOSPITAL_COMMUNITY): Payer: Self-pay | Admitting: Nurse Practitioner

## 2019-08-31 ENCOUNTER — Ambulatory Visit (HOSPITAL_COMMUNITY)
Admission: RE | Admit: 2019-08-31 | Discharge: 2019-08-31 | Disposition: A | Discharge status: Home / Self Care | Payer: Medicare Other | Source: Ambulatory Visit | Attending: Nurse Practitioner | Admitting: Nurse Practitioner

## 2019-08-31 ENCOUNTER — Other Ambulatory Visit: Payer: Self-pay

## 2019-08-31 VITALS — BP 140/72 | HR 56 | Ht 65.5 in | Wt 164.6 lb

## 2019-08-31 DIAGNOSIS — R001 Bradycardia, unspecified: Secondary | ICD-10-CM | POA: Diagnosis not present

## 2019-08-31 DIAGNOSIS — Z833 Family history of diabetes mellitus: Secondary | ICD-10-CM | POA: Diagnosis not present

## 2019-08-31 DIAGNOSIS — Z79899 Other long term (current) drug therapy: Secondary | ICD-10-CM | POA: Diagnosis not present

## 2019-08-31 DIAGNOSIS — Z803 Family history of malignant neoplasm of breast: Secondary | ICD-10-CM | POA: Diagnosis not present

## 2019-08-31 DIAGNOSIS — Z8249 Family history of ischemic heart disease and other diseases of the circulatory system: Secondary | ICD-10-CM | POA: Insufficient documentation

## 2019-08-31 DIAGNOSIS — R9431 Abnormal electrocardiogram [ECG] [EKG]: Secondary | ICD-10-CM | POA: Insufficient documentation

## 2019-08-31 DIAGNOSIS — I10 Essential (primary) hypertension: Secondary | ICD-10-CM | POA: Insufficient documentation

## 2019-08-31 DIAGNOSIS — I480 Paroxysmal atrial fibrillation: Secondary | ICD-10-CM | POA: Diagnosis not present

## 2019-08-31 DIAGNOSIS — Z7901 Long term (current) use of anticoagulants: Secondary | ICD-10-CM | POA: Diagnosis not present

## 2019-08-31 DIAGNOSIS — Z8673 Personal history of transient ischemic attack (TIA), and cerebral infarction without residual deficits: Secondary | ICD-10-CM | POA: Insufficient documentation

## 2019-08-31 DIAGNOSIS — Z87891 Personal history of nicotine dependence: Secondary | ICD-10-CM | POA: Insufficient documentation

## 2019-08-31 NOTE — Progress Notes (Signed)
Primary Care Physician: [name], MD Referring Physician:[name] is a 78 y.o. female with a h/o paroxysmal afib, CVA, s/p Linq that is in the afib clinic for yearly f/u. She was diagnosed with afib after stroke in 2019. She continues on eliquis bid without any bleeding issues. Had an afib episode in March of this year, associated with an overall flushed itchy sensation and was thought secondary to Niacin which she she has now stopped. No other awareness of afib.   Today, she denies symptoms of palpitations, chest pain, shortness of breath, orthopnea, PND, lower extremity edema, dizziness, presyncope, syncope, or neurologic sequela. The patient is tolerating medications without difficulties and is otherwise without complaint today.   Past Medical History:  Diagnosis Date  . Bradycardia   . Hypertension   . Paroxysmal atrial fibrillation (HCC)   . Stroke (cerebrum) [facility])    Past Surgical History:  Procedure Laterality Date  . CATARACT EXTRACTION, BILATERAL    . LOOP RECORDER INSERTION N/A 05/17/2018   Procedure: LOOP RECORDER INSERTION;  Surgeon: Deboraha Sprang, MD;  Location: [place] [facility];  Service: Cardiovascular;  Laterality: N/A;  . LOOP RECORDER INSERTION  05/17/2018   Procedure: LOOP RECORDER INSERTION;  Surgeon: Fay Records, MD;  Location: [place];  Service: Cardiovascular;;  . TEE WITHOUT CARDIOVERSION N/A 05/17/2018   Procedure: TRANSESOPHAGEAL ECHOCARDIOGRAM (TEE);  Surgeon: Fay Records, MD;  Location: [place];  Service: Cardiovascular;  Laterality: N/A;  . TRIGGER FINGER RELEASE    . TUMOR REMOVAL      Current Outpatient Medications  Medication Sig Dispense Refill  . atorvastatin (LIPITOR) 40 MG tablet Take 1 tablet (40 mg total) by mouth daily at 6 PM. 30 tablet 3  . azelastine (ASTELIN) 0.1 % nasal spray Place 2 sprays into the nose 2 (two) times daily.    . Cholecalciferol (VITAMIN D) 2000 units CAPS Take 2,000 Units by mouth  daily.     [name] Kitchen co-enzyme Q-10 50 MG capsule Take 100 mg by mouth daily.    [name] Kitchen ELIQUIS 5 MG TABS tablet TAKE 1 TABLET BY MOUTH TWICE DAILY 180 tablet 1  . levOCARNitine (CARNITINE, L,) POWD 1 tablet by Does not apply route daily.    . Magnesium 250 MG TABS Take 250 mg by mouth daily.    . Multiple Vitamin (MULTIVITAMIN) tablet Take 1 tablet by mouth daily.    . Multiple Vitamins-Calcium (ADVANCED AM/PM PO) Take 1 tablet by mouth daily.    . Multiple Vitamins-Minerals (IMMUNE SUPPORT PO) Take 1 tablet by mouth daily.    . Omega-3 1000 MG CAPS Take 1 capsule by mouth daily.    . Triamcinolone Acetonide (NASACORT AQ NA) Place 1 spray into both nostrils daily as needed (allergies).     . TURMERIC PO Take 1 capsule by mouth daily.    . verapamil (CALAN) 40 MG tablet Take 40mg , one tablet, up to one time daily for episodes of A Fib RVR. (Patient not taking: Reported on 08/31/2019) 15 tablet 0   No current facility-administered medications for this encounter.     No Known Allergies  Social History   Socioeconomic History  . Marital status: Single    Spouse name: Not on file  . Number of children: Not on file  . Years of education: Not on file  . Highest education level: Not on file  Occupational History  . Not on file  Social Needs  . Financial resource strain: Not  on file  . Food insecurity    Worry: Not on file    Inability: Not on file  . Transportation needs    Medical: Not on file    Non-medical: Not on file  Tobacco Use  . Smoking status: Former Research scientist (life sciences)  . Smokeless tobacco: Never Used  Substance and Sexual Activity  . Alcohol use: No  . Drug use: Never  . Sexual activity: Not on file  Lifestyle  . Physical activity    Days per week: Not on file    Minutes per session: Not on file  . Stress: Not on file  Relationships  . Social Herbalist on phone: Not on file    Gets together: Not on file    Attends religious service: Not on file    Active member of club or  organization: Not on file    Attends meetings of clubs or organizations: Not on file    Relationship status: Not on file  . Intimate partner violence    Fear of current or ex partner: Not on file    Emotionally abused: Not on file    Physically abused: Not on file    Forced sexual activity: Not on file  Other Topics Concern  . Not on file  Social History Narrative  . Not on file    Family History  Problem Relation Age of Onset  . Breast cancer Mother   . Diabetes Mother   . Thyroid disease Mother   . Heart disease Father   . Hypertension Sister   . Thyroid disease Sister   . Diabetes Brother   . Hypertension Brother     ROS- All systems are reviewed and negative except as per the HPI above  Physical Exam: Vitals:   08/31/19 0934  BP: 140/72  Pulse: (!) 56  Weight: 74.7 kg  Height: 5' 5.5" (1.664 m)   Wt Readings from Last 3 Encounters:  08/31/19 74.7 kg  06/22/19 75.6 kg  01/24/19 74.4 kg    Labs: Lab Results  Component Value Date   NA 139 01/24/2019   K 3.6 01/24/2019   CL 105 01/24/2019   CO2 24 01/24/2019   GLUCOSE 152 (H) 01/24/2019   BUN 16 01/24/2019   CREATININE 0.85 01/24/2019   CALCIUM 10.9 (H) 01/24/2019   Lab Results  Component Value Date   INR 1.02 05/15/2018   Lab Results  Component Value Date   CHOL 182 05/15/2018   HDL 53 05/15/2018   LDLCALC 106 (H) 05/15/2018   TRIG 115 05/15/2018     GEN- The patient is well appearing, alert and oriented x 3 today.   Head- normocephalic, atraumatic Eyes-  Sclera clear, conjunctiva pink Ears- hearing intact Oropharynx- clear Neck- supple, no JVP Lymph- no cervical lymphadenopathy Lungs- Clear to ausculation bilaterally, normal work of breathing Heart- Regular rate and rhythm, no murmurs, rubs or gallops, PMI not laterally displaced GI- soft, NT, ND, + BS Extremities- no clubbing, cyanosis, or edema MS- no significant deformity or atrophy Skin- no rash or lesion Psych- euthymic mood, full  affect Neuro- strength and sensation are intact  EKG- Sinus brady at 56 bpm, pr int 182 ms, qrs int 85 ms, qtc 409 ms    Assessment and Plan: 1. Afib  Recently pt unaware of any episodes  Episode of afib in March associated with Niacin that pt has now stopped taking She does have verapamil 40 mg to use as needed  2. CHA2DS2VASc  score of 6 Continue eliquis 5 mg bid No bleeding issues Cbc/bmet stable in March   F/u with Dr. [name] as scheduled 10/29  Kristin Lowe. Kristin Lowe, [facility] [address] [place], [place] [address] [phone]

## 2019-09-06 DIAGNOSIS — Z1331 Encounter for screening for depression: Secondary | ICD-10-CM | POA: Diagnosis not present

## 2019-09-06 DIAGNOSIS — J45909 Unspecified asthma, uncomplicated: Secondary | ICD-10-CM | POA: Diagnosis not present

## 2019-09-06 DIAGNOSIS — R001 Bradycardia, unspecified: Secondary | ICD-10-CM | POA: Diagnosis not present

## 2019-09-06 DIAGNOSIS — R030 Elevated blood-pressure reading, without diagnosis of hypertension: Secondary | ICD-10-CM | POA: Diagnosis not present

## 2019-09-06 DIAGNOSIS — R3121 Asymptomatic microscopic hematuria: Secondary | ICD-10-CM | POA: Diagnosis not present

## 2019-09-06 DIAGNOSIS — E041 Nontoxic single thyroid nodule: Secondary | ICD-10-CM | POA: Diagnosis not present

## 2019-09-06 DIAGNOSIS — M8580 Other specified disorders of bone density and structure, unspecified site: Secondary | ICD-10-CM | POA: Diagnosis not present

## 2019-09-06 DIAGNOSIS — Z0000 Encounter for general adult medical examination without abnormal findings: Secondary | ICD-10-CM | POA: Diagnosis not present

## 2019-09-06 DIAGNOSIS — I480 Paroxysmal atrial fibrillation: Secondary | ICD-10-CM | POA: Diagnosis not present

## 2019-09-06 DIAGNOSIS — I6389 Other cerebral infarction: Secondary | ICD-10-CM | POA: Diagnosis not present

## 2019-09-06 DIAGNOSIS — M170 Bilateral primary osteoarthritis of knee: Secondary | ICD-10-CM | POA: Diagnosis not present

## 2019-09-06 DIAGNOSIS — B029 Zoster without complications: Secondary | ICD-10-CM | POA: Diagnosis not present

## 2019-09-06 DIAGNOSIS — I444 Left anterior fascicular block: Secondary | ICD-10-CM | POA: Diagnosis not present

## 2019-09-08 DIAGNOSIS — Z1212 Encounter for screening for malignant neoplasm of rectum: Secondary | ICD-10-CM | POA: Diagnosis not present

## 2019-09-22 ENCOUNTER — Ambulatory Visit: Payer: Medicare Other | Admitting: Internal Medicine

## 2019-09-25 LAB — CUP PACEART REMOTE DEVICE CHECK
Date Time Interrogation Session: 20201101174623
Implantable Pulse Generator Implant Date: 20190624

## 2019-09-26 ENCOUNTER — Ambulatory Visit (INDEPENDENT_AMBULATORY_CARE_PROVIDER_SITE_OTHER): Payer: Medicare Other | Admitting: *Deleted

## 2019-09-26 DIAGNOSIS — R001 Bradycardia, unspecified: Secondary | ICD-10-CM

## 2019-09-26 DIAGNOSIS — G459 Transient cerebral ischemic attack, unspecified: Secondary | ICD-10-CM

## 2019-09-29 DIAGNOSIS — D1801 Hemangioma of skin and subcutaneous tissue: Secondary | ICD-10-CM | POA: Diagnosis not present

## 2019-09-29 DIAGNOSIS — L821 Other seborrheic keratosis: Secondary | ICD-10-CM | POA: Diagnosis not present

## 2019-09-29 DIAGNOSIS — D2262 Melanocytic nevi of left upper limb, including shoulder: Secondary | ICD-10-CM | POA: Diagnosis not present

## 2019-09-29 DIAGNOSIS — D2261 Melanocytic nevi of right upper limb, including shoulder: Secondary | ICD-10-CM | POA: Diagnosis not present

## 2019-09-29 DIAGNOSIS — L820 Inflamed seborrheic keratosis: Secondary | ICD-10-CM | POA: Diagnosis not present

## 2019-10-07 DIAGNOSIS — H5000 Unspecified esotropia: Secondary | ICD-10-CM | POA: Diagnosis not present

## 2019-10-10 DIAGNOSIS — J45998 Other asthma: Secondary | ICD-10-CM | POA: Diagnosis not present

## 2019-10-10 DIAGNOSIS — M8589 Other specified disorders of bone density and structure, multiple sites: Secondary | ICD-10-CM | POA: Diagnosis not present

## 2019-10-18 NOTE — Progress Notes (Signed)
[facility] Summary Report / Loop Recorder 

## 2019-10-25 DIAGNOSIS — H5000 Unspecified esotropia: Secondary | ICD-10-CM | POA: Diagnosis not present

## 2019-10-28 ENCOUNTER — Ambulatory Visit (INDEPENDENT_AMBULATORY_CARE_PROVIDER_SITE_OTHER): Payer: Medicare Other | Admitting: *Deleted

## 2019-10-28 DIAGNOSIS — G459 Transient cerebral ischemic attack, unspecified: Secondary | ICD-10-CM | POA: Diagnosis not present

## 2019-10-29 LAB — CUP PACEART REMOTE DEVICE CHECK
Date Time Interrogation Session: 20201204141438
Implantable Pulse Generator Implant Date: 20190624

## 2019-11-03 ENCOUNTER — Ambulatory Visit (INDEPENDENT_AMBULATORY_CARE_PROVIDER_SITE_OTHER): Payer: Medicare Other | Admitting: Internal Medicine

## 2019-11-03 ENCOUNTER — Encounter: Payer: Self-pay | Admitting: Internal Medicine

## 2019-11-03 ENCOUNTER — Other Ambulatory Visit: Payer: Self-pay

## 2019-11-03 VITALS — BP 130/80 | HR 50 | Ht 65.5 in | Wt 164.0 lb

## 2019-11-03 DIAGNOSIS — R001 Bradycardia, unspecified: Secondary | ICD-10-CM | POA: Diagnosis not present

## 2019-11-03 DIAGNOSIS — I480 Paroxysmal atrial fibrillation: Secondary | ICD-10-CM

## 2019-11-03 DIAGNOSIS — G459 Transient cerebral ischemic attack, unspecified: Secondary | ICD-10-CM | POA: Diagnosis not present

## 2019-11-03 DIAGNOSIS — I6389 Other cerebral infarction: Secondary | ICD-10-CM | POA: Diagnosis not present

## 2019-11-03 NOTE — Progress Notes (Signed)
Patient Care Team: [name], MD as PCP - General (Internal Medicine)   HPI  Kristin Lowe is a 78 y.o. female Seen in followup for Cryptogenic Stroke  Implantable loop recorder was inserted demonstrating atrial fibrillation.  DAPT was discontinued and Eliquis was started  On Anticoagulation;  No bleeding issues     DATE TEST EF   6/19 Echo   55-65 %           Date Cr Hgb  7/19 0.81  12.9   10/20 0.7 [id number]     Thromboembolic risk factors ( age  -2, HTN-1, TIA/CVA-2, DM-1, Gender-1) for a CHADSVASc Score of 6 The patient denies chest pain, shortness of breath, nocturnal dyspnea, orthopnea or peripheral edema.  There have been no palpitations, lightheadedness or syncope.   Records and Results Reviewed   Past Medical History:  Diagnosis Date  . Bradycardia   . Hypertension   . Paroxysmal atrial fibrillation (HCC)   . Stroke (cerebrum) [facility])     Past Surgical History:  Procedure Laterality Date  . CATARACT EXTRACTION, BILATERAL    . LOOP RECORDER INSERTION N/A 05/17/2018   Procedure: LOOP RECORDER INSERTION;  Surgeon: Deboraha Sprang, MD;  Location: [place] [facility];  Service: Cardiovascular;  Laterality: N/A;  . LOOP RECORDER INSERTION  05/17/2018   Procedure: LOOP RECORDER INSERTION;  Surgeon: Fay Records, MD;  Location: [place];  Service: Cardiovascular;;  . TEE WITHOUT CARDIOVERSION N/A 05/17/2018   Procedure: TRANSESOPHAGEAL ECHOCARDIOGRAM (TEE);  Surgeon: Fay Records, MD;  Location: [place];  Service: Cardiovascular;  Laterality: N/A;  . TRIGGER FINGER RELEASE    . TUMOR REMOVAL      Current Meds  Medication Sig  . atorvastatin (LIPITOR) 40 MG tablet Take 1 tablet (40 mg total) by mouth daily at 6 PM.  . azelastine (ASTELIN) 0.1 % nasal spray Place 2 sprays into the nose 2 (two) times daily.  . Cholecalciferol (VITAMIN D) 2000 units CAPS Take 2,000 Units by mouth daily.   [name] Kitchen co-enzyme Q-10 50 MG capsule Take 100 mg by mouth daily.   [name] Kitchen ELIQUIS 5 MG TABS tablet TAKE 1 TABLET BY MOUTH TWICE DAILY  . levOCARNitine (CARNITINE, L,) POWD 1 tablet by Does not apply route daily.  . Magnesium 250 MG TABS Take 250 mg by mouth daily.  . Multiple Vitamin (MULTIVITAMIN) tablet Take 1 tablet by mouth daily.  . Multiple Vitamins-Calcium (ADVANCED AM/PM PO) Take 1 tablet by mouth daily.  . Multiple Vitamins-Minerals (IMMUNE SUPPORT PO) Take 1 tablet by mouth daily.  . Omega-3 1000 MG CAPS Take 1 capsule by mouth daily.  . Triamcinolone Acetonide (NASACORT AQ NA) Place 1 spray into both nostrils daily as needed (allergies).   . TURMERIC PO Take 1 capsule by mouth daily.  . verapamil (CALAN) 40 MG tablet Take 40mg , one tablet, up to one time daily for episodes of A Fib RVR.    No Known Allergies    Review of Systems negative except from HPI and PMH  Physical Exam Particular this lady first a few BP 130/80   Pulse (!) 50   Ht 5' 5.5" (1.664 m)   Wt 164 lb (74.4 kg)   SpO2 99%   BMI 26.88 kg/m  Well developed and nourished in no acute distress HENT normal Neck supple with JVP-  flat   Clear Regular rate and rhythm, no murmurs or gallops Abd-soft with active BS No Clubbing cyanosis edema Skin-warm and dry  A & Oriented  Grossly normal sensory and motor function  ECG significant artifact despite efforts.  This was noted in all leads with lead I.  The internal electrogram from the loop recorder was also normal.  Assessment and  Plan  Cryptogenic Stroke  Bradycardia  Atrial fibrillation    No intercurrent atrial fibrillation or flutter  ,On Anticoagulation;  No bleeding issues     Current medicines are reviewed at length with the patient today .  The patient does not  have concerns regarding medicines.

## 2019-11-03 NOTE — Patient Instructions (Signed)

## 2019-11-07 ENCOUNTER — Other Ambulatory Visit: Payer: Self-pay

## 2019-11-07 ENCOUNTER — Ambulatory Visit
Admission: RE | Admit: 2019-11-07 | Discharge: 2019-11-07 | Disposition: A | Discharge status: Home / Self Care | Payer: Medicare Other | Source: Ambulatory Visit | Attending: Internal Medicine | Admitting: Internal Medicine

## 2019-11-07 DIAGNOSIS — Z1231 Encounter for screening mammogram for malignant neoplasm of breast: Secondary | ICD-10-CM | POA: Diagnosis not present

## 2019-11-30 ENCOUNTER — Ambulatory Visit (INDEPENDENT_AMBULATORY_CARE_PROVIDER_SITE_OTHER): Payer: Medicare Other | Admitting: *Deleted

## 2019-11-30 DIAGNOSIS — G459 Transient cerebral ischemic attack, unspecified: Secondary | ICD-10-CM

## 2019-12-01 LAB — CUP PACEART REMOTE DEVICE CHECK
Date Time Interrogation Session: 20210106143457
Implantable Pulse Generator Implant Date: 20190624

## 2019-12-14 ENCOUNTER — Other Ambulatory Visit: Payer: Self-pay | Admitting: Internal Medicine

## 2019-12-14 MED ORDER — APIXABAN 5 MG PO TABS: 5.0000 mg | ORAL_TABLET | Freq: Two times a day (BID) | ORAL | 1 refills | Status: DC

## 2019-12-14 NOTE — Telephone Encounter (Signed)
Pt last saw Dr [name] 11/03/19, last labs 01/24/19 Creat 0.85, age 79, weight 74.4kg, based on specified criteria pt is on appropriate dosage of Eliquis 5mg  BID.  Will refill rx.

## 2019-12-19 ENCOUNTER — Ambulatory Visit: Payer: Medicare Other | Attending: Internal Medicine

## 2019-12-19 DIAGNOSIS — Z23 Encounter for immunization: Secondary | ICD-10-CM | POA: Insufficient documentation

## 2019-12-19 NOTE — Progress Notes (Signed)
   Covid-19 Vaccination Clinic  Name:  Kristin Lowe    MRN: [id number] DOB: Dec 17, 1940  12/19/2019  [name] was observed post Covid-19 immunization for 30 minutes based on pre-vaccination screening without incidence. She was provided with Vaccine Information Sheet and instruction to access the V-Safe system.   [name] was instructed to call 911 with any severe reactions post vaccine: [name] Kitchen Difficulty breathing  . Swelling of your face and throat  . A fast heartbeat  . A bad rash all over your body  . Dizziness and weakness    Immunizations Administered    Name Date Dose VIS Date Route   Pfizer COVID-19 Vaccine 12/19/2019  9:24 AM 0.3 mL 11/04/2019 Intramuscular   Manufacturer: [place]   Lot: [id number]   [place]: [id number]

## 2019-12-22 DIAGNOSIS — B373 Candidiasis of vulva and vagina: Secondary | ICD-10-CM | POA: Diagnosis not present

## 2019-12-22 DIAGNOSIS — L900 Lichen sclerosus et atrophicus: Secondary | ICD-10-CM | POA: Diagnosis not present

## 2019-12-29 DIAGNOSIS — W540XXA Bitten by dog, initial encounter: Secondary | ICD-10-CM | POA: Diagnosis not present

## 2019-12-29 DIAGNOSIS — M79644 Pain in right finger(s): Secondary | ICD-10-CM | POA: Diagnosis not present

## 2019-12-30 DIAGNOSIS — S61250A Open bite of right index finger without damage to nail, initial encounter: Secondary | ICD-10-CM | POA: Diagnosis not present

## 2019-12-30 DIAGNOSIS — M79644 Pain in right finger(s): Secondary | ICD-10-CM | POA: Diagnosis not present

## 2020-01-02 ENCOUNTER — Ambulatory Visit (INDEPENDENT_AMBULATORY_CARE_PROVIDER_SITE_OTHER): Payer: Medicare Other | Admitting: *Deleted

## 2020-01-02 DIAGNOSIS — G459 Transient cerebral ischemic attack, unspecified: Secondary | ICD-10-CM | POA: Diagnosis not present

## 2020-01-02 LAB — CUP PACEART REMOTE DEVICE CHECK
Date Time Interrogation Session: 20210207235657
Implantable Pulse Generator Implant Date: 20190624

## 2020-01-02 NOTE — Progress Notes (Signed)
ILR Remote 

## 2020-01-03 DIAGNOSIS — M79644 Pain in right finger(s): Secondary | ICD-10-CM | POA: Diagnosis not present

## 2020-01-03 DIAGNOSIS — S61250D Open bite of right index finger without damage to nail, subsequent encounter: Secondary | ICD-10-CM | POA: Diagnosis not present

## 2020-01-09 ENCOUNTER — Ambulatory Visit: Payer: Medicare Other | Attending: Internal Medicine

## 2020-01-09 DIAGNOSIS — Z23 Encounter for immunization: Secondary | ICD-10-CM | POA: Insufficient documentation

## 2020-01-09 NOTE — Progress Notes (Signed)
   Covid-19 Vaccination Clinic  Name:  Skylyr Rygh    MRN: [id number] DOB: 1941/02/19  01/09/2020  Ms. Geeslin was observed post Covid-19 immunization for 15 minutes without incidence. She was provided with Vaccine Information Sheet and instruction to access the V-Safe system.   Ms. Stepanyan was instructed to call 911 with any severe reactions post vaccine: [name] Kitchen Difficulty breathing  . Swelling of your face and throat  . A fast heartbeat  . A bad rash all over your body  . Dizziness and weakness    Immunizations Administered    Name Date Dose VIS Date Route   Pfizer COVID-19 Vaccine 01/09/2020  9:55 AM 0.3 mL 11/04/2019 Intramuscular   Manufacturer: [place]   Lot: [id number]   [place]: [id number]

## 2020-02-02 ENCOUNTER — Ambulatory Visit (INDEPENDENT_AMBULATORY_CARE_PROVIDER_SITE_OTHER): Payer: Medicare Other | Admitting: *Deleted

## 2020-02-02 DIAGNOSIS — G459 Transient cerebral ischemic attack, unspecified: Secondary | ICD-10-CM

## 2020-02-02 LAB — CUP PACEART REMOTE DEVICE CHECK
Date Time Interrogation Session: 20210311002244
Implantable Pulse Generator Implant Date: 20190624

## 2020-02-02 NOTE — Progress Notes (Signed)
ILR Remote 

## 2020-02-20 ENCOUNTER — Telehealth: Payer: Self-pay | Admitting: Internal Medicine

## 2020-02-20 NOTE — Telephone Encounter (Signed)
New Message   Pt c/o medication issue:  1. Name of Medication: apixaban (ELIQUIS) 5 MG TABS tablet    2. How are you currently taking this medication (dosage and times per day)? Take 1 tablet (5 mg total) by mouth 2 (two) times daily.  3. Are you having a reaction (difficulty breathing--STAT)?   4. What is your medication issue? Patient is calling because she needs assistance with the [facility] patient assistance form. Please call.

## 2020-02-20 NOTE — Telephone Encounter (Addendum)
**Note De-Identified Jefry Lesinski Obfuscation** Per Dr Aquilla Hacker nurse Rosann Auerbach, Dr [name] has signed and dated the the [employer] pt asst application for Eliquis and she has faxed it to [employer] Pt Asst program and received confirmation that it sent successfully.  The pt is aware and she expressed much appreciation for our help.

## 2020-02-20 NOTE — Telephone Encounter (Signed)
Reopened signed in error

## 2020-02-20 NOTE — Telephone Encounter (Addendum)
**Note De-Identified Braxtin Bamba Obfuscation** The pt states that she is faxing her part of a [employer] pt asst application for her Eliquis in to [employer] with a message stating that we (Dr Olin Pia office) is faxing the provider page in today.  I have completed the MD part of a [employer] pt asst app, scanned and emailed it to Dr Olin Pia nurse with a request to have him sign and date today and to fax to the number written on the cover sheet I included. I also wrote on the cover letter to add to the pts application that she faxed in today. Case#: [id number]  The pt is requesting that we call her back ASAP once we have faxed the provider part of her [employer] pt asst application to [employer].

## 2020-02-22 NOTE — Telephone Encounter (Signed)
**Note De-Identified Kristin Lowe Obfuscation** Letter received from [employer] Pt Asst Program stating that they approved the pt for asst with his Eliquis. Approval good until 0000000 Application Case#: [id number]  The letter states that they have notified the pt of this approval as well.

## 2020-03-01 ENCOUNTER — Telehealth: Payer: Self-pay | Admitting: Internal Medicine

## 2020-03-01 MED ORDER — VERAPAMIL HCL 40 MG PO TABS: ORAL_TABLET | ORAL | 1 refills | Status: DC

## 2020-03-01 NOTE — Telephone Encounter (Signed)
New Message  Patient c/o Palpitations:  High priority if patient c/o lightheadedness, shortness of breath, or chest pain  1) How long have you had palpitations/irregular HR/ Afib? Are you having the symptoms now? Yes  2) Are you currently experiencing lightheadedness, SOB or CP? No  3) Do you have a history of afib (atrial fibrillation) or irregular heart rhythm? Yes  4) Have you checked your BP or HR? (document readings if available): 125/71 HR 102  5) Are you experiencing any other symptoms? Patient states that she is having afib symptoms (heart pounding) and they are lasting longer than usually. Please give patient a call to discuss.

## 2020-03-01 NOTE — Telephone Encounter (Signed)
Patient's PRN verapamil expired last week so she threw it away and just needed a new prescription sent to her pharmacy so she has it on hand. She's been in AF for several hours today just makes her tired but otherwise no symptoms. She will call back if PRN verapamil does not convert her.

## 2020-03-05 ENCOUNTER — Ambulatory Visit (INDEPENDENT_AMBULATORY_CARE_PROVIDER_SITE_OTHER): Payer: Medicare Other | Admitting: *Deleted

## 2020-03-05 DIAGNOSIS — G459 Transient cerebral ischemic attack, unspecified: Secondary | ICD-10-CM | POA: Diagnosis not present

## 2020-03-05 LAB — CUP PACEART REMOTE DEVICE CHECK
Date Time Interrogation Session: 20210411033309
Implantable Pulse Generator Implant Date: 20190624

## 2020-03-05 NOTE — Progress Notes (Signed)
ILR Remote 

## 2020-03-28 DIAGNOSIS — L603 Nail dystrophy: Secondary | ICD-10-CM | POA: Diagnosis not present

## 2020-03-28 DIAGNOSIS — D2261 Melanocytic nevi of right upper limb, including shoulder: Secondary | ICD-10-CM | POA: Diagnosis not present

## 2020-03-28 DIAGNOSIS — L821 Other seborrheic keratosis: Secondary | ICD-10-CM | POA: Diagnosis not present

## 2020-03-28 DIAGNOSIS — L820 Inflamed seborrheic keratosis: Secondary | ICD-10-CM | POA: Diagnosis not present

## 2020-03-28 DIAGNOSIS — D225 Melanocytic nevi of trunk: Secondary | ICD-10-CM | POA: Diagnosis not present

## 2020-03-28 DIAGNOSIS — D1801 Hemangioma of skin and subcutaneous tissue: Secondary | ICD-10-CM | POA: Diagnosis not present

## 2020-04-04 LAB — CUP PACEART REMOTE DEVICE CHECK
Date Time Interrogation Session: 20210512032944
Implantable Pulse Generator Implant Date: 20190624

## 2020-04-09 ENCOUNTER — Ambulatory Visit (INDEPENDENT_AMBULATORY_CARE_PROVIDER_SITE_OTHER): Payer: Medicare Other | Admitting: *Deleted

## 2020-04-09 DIAGNOSIS — G459 Transient cerebral ischemic attack, unspecified: Secondary | ICD-10-CM | POA: Diagnosis not present

## 2020-04-09 NOTE — Progress Notes (Signed)
[facility] Summary Report / Loop Recorder 

## 2020-04-11 ENCOUNTER — Telehealth: Payer: Self-pay | Admitting: Internal Medicine

## 2020-04-11 NOTE — Telephone Encounter (Signed)
New Message  Pt c/o swelling: STAT is pt has developed SOB within 24 hours  1) How much weight have you gained and in what time span? States that weight flucuates  2) If swelling, where is the swelling located? Ankles and Legs  3) Are you currently taking a fluid pill? No  4) Are you currently SOB? No  5) Do you have a log of your daily weights (if so, list)? 162, 167  6) Have you gained 3 pounds in a day or 5 pounds in a week? No  7) Have you traveled recently? No

## 2020-04-11 NOTE — Telephone Encounter (Signed)
Spoke with pt who is complaining of new lower extremity edema x 2 weeks.  Pt has a Linq in place and a history of stroke, HTN,  bradycardia and Afib.  Pt reports her B/P has been stable for her and she does monitor her Na+ intake. She reports she drinks an adequate amount of water.  Pt reports edema resolves during the night but returns quickly in the am.  She weighs herself daily and reports a gradual 5 pound weight gain over the past few weeks that she cannot seem to lose.  She generally walks 10,000 steps daily and denies SOB or CP at this time.  Pt last saw Dr [name] 10/2019. Pt states she had a general cardiologist in [facility] but has not established care here in [place].  Appointment scheduled with Oda Kilts, PA for 04/12/2020 at 1pm for further evaluation.

## 2020-04-12 ENCOUNTER — Encounter: Payer: Self-pay | Admitting: Student

## 2020-04-12 ENCOUNTER — Ambulatory Visit (INDEPENDENT_AMBULATORY_CARE_PROVIDER_SITE_OTHER): Payer: Medicare Other | Admitting: Student

## 2020-04-12 ENCOUNTER — Other Ambulatory Visit: Payer: Self-pay

## 2020-04-12 VITALS — BP 120/58 | HR 51 | Ht 65.0 in | Wt 167.0 lb

## 2020-04-12 DIAGNOSIS — I872 Venous insufficiency (chronic) (peripheral): Secondary | ICD-10-CM | POA: Diagnosis not present

## 2020-04-12 DIAGNOSIS — R001 Bradycardia, unspecified: Secondary | ICD-10-CM

## 2020-04-12 DIAGNOSIS — I480 Paroxysmal atrial fibrillation: Secondary | ICD-10-CM | POA: Diagnosis not present

## 2020-04-12 LAB — CUP PACEART INCLINIC DEVICE CHECK
Date Time Interrogation Session: 20210520135634
Implantable Pulse Generator Implant Date: 20190624

## 2020-04-12 NOTE — Progress Notes (Signed)
Electrophysiology Office Note Date: 04/12/2020  ID:  Kristin Lowe, [place]att[place] Sep 23, 1941, MRN [id number]  PCP: [name], MD Primary Cardiologist: No primary care provider on file. Electrophysiologist: Virl Axe, MD   CC: ILR follow-up  Kristin Lowe is a 79 y.o. female seen today for Dr. [name] . she presents today for routine electrophysiology followup.  Since last being seen in our clinic, the patient reports doing well. She is very active, walking 10k to 16k steps daily. She notices swelling in her ankles throughout the day, with occasional discomfort in the evenings associated with the edema. This has been worse in the past 2 weeks since she has injured her L knee. L>R swelling at baseline currently. Non tender, has not missed any eliquis. She has been told she had venous issues in the past when she lived in [place]. She has occasional leg cramping, and hydrates very well. She will at times drink up to 12 bottles of water in a day when working out in the heat. She denies chest pain, palpitations, dyspnea, PND, orthopnea, nausea, vomiting, dizziness, syncope, or early satiety.  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 Bradycardia   . Hypertension   . Paroxysmal atrial fibrillation (HCC)   . Stroke (cerebrum) [facility])    Past Surgical History:  Procedure Laterality Date  . CATARACT EXTRACTION, BILATERAL    . LOOP RECORDER INSERTION N/A 05/17/2018   Procedure: LOOP RECORDER INSERTION;  Surgeon: Deboraha Sprang, MD;  Location: [place] [facility];  Service: Cardiovascular;  Laterality: N/A;  . LOOP RECORDER INSERTION  05/17/2018   Procedure: LOOP RECORDER INSERTION;  Surgeon: Fay Records, MD;  Location: [facility];  Service: Cardiovascular;;  . TEE WITHOUT CARDIOVERSION N/A 05/17/2018   Procedure: TRANSESOPHAGEAL ECHOCARDIOGRAM (TEE);  Surgeon: Fay Records, MD;  Location: [facility];  Service:  Cardiovascular;  Laterality: N/A;  . TRIGGER FINGER RELEASE    . TUMOR REMOVAL      Current Outpatient Medications  Medication Sig Dispense Refill  . apixaban (ELIQUIS) 5 MG TABS tablet Take 1 tablet (5 mg total) by mouth 2 (two) times daily. 180 tablet 1  . atorvastatin (LIPITOR) 40 MG tablet Take 1 tablet (40 mg total) by mouth daily at 6 PM. 30 tablet 3  . azelastine (ASTELIN) 0.1 % nasal spray Place 2 sprays into the nose 2 (two) times daily.    . Cholecalciferol (VITAMIN D) 2000 units CAPS Take 2,000 Units by mouth daily.     . clobetasol ointment (TEMOVATE) 0.05 % as needed.    [name] Kitchen co-enzyme Q-10 50 MG capsule Take 100 mg by mouth daily.    [name] Kitchen levOCARNitine (CARNITINE, L,) POWD 1 tablet by Does not apply route daily.    . Magnesium 250 MG TABS Take 250 mg by mouth daily.    . Multiple Vitamin (MULTIVITAMIN) tablet Take 1 tablet by mouth daily.    . Multiple Vitamins-Calcium (ADVANCED AM/PM PO) Take 1 tablet by mouth daily.    . Multiple Vitamins-Minerals (IMMUNE SUPPORT PO) Take 1 tablet by mouth daily.    . Omega-3 1000 MG CAPS Take 1 capsule by mouth daily.    . Triamcinolone Acetonide (NASACORT AQ NA) Place 1 spray into both nostrils daily as needed (allergies).     . TURMERIC PO Take 1 capsule by mouth daily.    . verapamil (CALAN) 40 MG tablet Take 40mg , one tablet, up to one time daily  for episodes of A Fib RVR. 15 tablet 1   No current facility-administered medications for this visit.    Allergies:   Patient has no known allergies.   Social History: Social History   Socioeconomic History  . Marital status: Single    Spouse name: Not on file  . Number of children: Not on file  . Years of education: Not on file  . Highest education level: Not on file  Occupational History  . Not on file  Tobacco Use  . Smoking status: Former Research scientist (life sciences)  . Smokeless tobacco: Never Used  Substance and Sexual Activity  . Alcohol use: No  . Drug use: Never  . Sexual activity: Not on file    Other Topics Concern  . Not on file  Social History Narrative  . Not on file   Social Determinants of Health   Financial Resource Strain:   . Difficulty of Paying Living Expenses:   Food Insecurity:   . Worried About Charity fundraiser in the Last Year:   . Arboriculturist in the Last Year:   Transportation Needs:   . Film/video editor (Medical):   [name] Kitchen Lack of Transportation (Non-Medical):   Physical Activity:   . Days of Exercise per Week:   . Minutes of Exercise per Session:   Stress:   . Feeling of Stress :   Social Connections:   . Frequency of Communication with Friends and Family:   . Frequency of Social Gatherings with Friends and Family:   . Attends Religious Services:   . Active Member of Clubs or Organizations:   . Attends Archivist Meetings:   [name] Kitchen Marital Status:   Intimate Partner Violence:   . Fear of Current or Ex-Partner:   . Emotionally Abused:   [name] Kitchen Physically Abused:   . Sexually Abused:     Family History: Family History  Problem Relation Age of Onset  . Breast cancer Mother   . Diabetes Mother   . Thyroid disease Mother   . Heart disease Father   . Hypertension Sister   . Thyroid disease Sister   . Diabetes Brother   . Hypertension Brother      Review of Systems: All other systems reviewed and are otherwise negative except as noted above.  Physical Exam: Vitals:   04/12/20 1323  BP: (!) 120/58  Pulse: (!) 51  SpO2: 97%  Weight: 167 lb (75.8 kg)  Height: 5\' 5"  (1.651 m)     GEN- The patient is well appearing, alert and oriented x 3 today.   HEENT: normocephalic, atraumatic; sclera clear, conjunctiva pink; hearing intact; oropharynx clear; neck supple  Lungs- Clear to ausculation bilaterally, normal work of breathing.  No wheezes, rales, rhonchi Heart- Regular rate and rhythm, no murmurs, rubs or gallops  GI- soft, non-tender, non-distended, bowel sounds present  Extremities- no clubbing, cyanosis, or edema  MS- no  significant deformity or atrophy Skin- warm and dry, no rash or lesion; PPM pocket well healed Psych- euthymic mood, full affect Neuro- strength and sensation are intact  PPM Interrogation- reviewed in detail today,  See PACEART report  EKG:  EKG is ordered today. The ekg ordered today shows sinus bradycardia at 51 bpm with normal intervals.  Recent Labs: No results found for requested labs within last 8760 hours.   Wt Readings from Last 3 Encounters:  04/12/20 167 lb (75.8 kg)  11/03/19 164 lb (74.4 kg)  08/31/19 164 lb 9.6 oz (74.7 kg)  Other studies Reviewed: Additional studies/ records that were reviewed today include: Echo 04/2018 shows LVEF normal by TEE, Previous EP office notes, Previous remote checks.  Assessment and Plan:  1. Cryptogenic Stroke s/p Medtronic Loop recorder -> PAF identified Normal device function, with low R waves and -> oversensing at times. See Pace Art report No changes today  2. PAF Low burden.  Rates overall controlled  Continue Eliquis for CHA2DS2VASC of at least 6    3. Venous insufficiency Chronic component, exacerbated by recent knee injury that she follows up for on Monday Recommend compression hose.  Could consider prn lasix for days that are worse than others. Pt declines at this time.  She hydrates very well, which could also contribute to her intermittent edema; but she works out consistently in the heat and has significant cramping if she does not.  I encouraged follow up with PCP -> ? Vascular referral for further if unresolved.  Current medicines are reviewed at length with the patient today.   The patient does not have concerns regarding her medicines.  The following changes were made today:  none  Labs/ tests ordered today include:  Orders Placed This Encounter  Procedures  . CUP PACEART [place]  . EKG 12-Lead   Disposition:   Follow up with Dr. [name] in 6 months as scheduled.   Jacalyn Lefevre, PA-C  04/12/2020 2:14 PM  [facility] [address] [place] [address] [phone] (office) [phone] (fax)

## 2020-04-12 NOTE — Patient Instructions (Addendum)
Medication Instructions:  NONE *If you need a refill on your cardiac medications before your next appointment, please call your pharmacy*   Lab Work: NONE If you have labs (blood work) drawn today and your tests are completely normal, you will receive your results only by: [name] Kitchen MyChart Message (if you have MyChart) OR . A paper copy in the mail If you have any lab test that is abnormal or we need to change your treatment, we will call you to review the results.   Testing/Procedures: NONE   Follow-Up: Recall December with Dr [name] At [facility], you and your health needs are our priority.  As part of our continuing mission to provide you with exceptional heart care, we have created designated Provider Care Teams.  These Care Teams include your primary Cardiologist (physician) and Advanced Practice Providers (APPs -  Physician Assistants and Nurse Practitioners) who all work together to provide you with the care you need, when you need it.    Other Instructions

## 2020-04-16 DIAGNOSIS — M25562 Pain in left knee: Secondary | ICD-10-CM | POA: Diagnosis not present

## 2020-04-16 DIAGNOSIS — S83242A Other tear of medial meniscus, current injury, left knee, initial encounter: Secondary | ICD-10-CM | POA: Diagnosis not present

## 2020-05-14 ENCOUNTER — Ambulatory Visit (INDEPENDENT_AMBULATORY_CARE_PROVIDER_SITE_OTHER): Payer: Medicare Other | Admitting: *Deleted

## 2020-05-14 DIAGNOSIS — G459 Transient cerebral ischemic attack, unspecified: Secondary | ICD-10-CM | POA: Diagnosis not present

## 2020-05-14 LAB — CUP PACEART REMOTE DEVICE CHECK
Date Time Interrogation Session: 20210620235841
Implantable Pulse Generator Implant Date: 20190624

## 2020-05-14 NOTE — Progress Notes (Signed)
[facility] Summary Report / Loop Recorder 

## 2020-06-18 ENCOUNTER — Ambulatory Visit (INDEPENDENT_AMBULATORY_CARE_PROVIDER_SITE_OTHER): Payer: Medicare Other | Admitting: *Deleted

## 2020-06-18 DIAGNOSIS — G459 Transient cerebral ischemic attack, unspecified: Secondary | ICD-10-CM | POA: Diagnosis not present

## 2020-06-18 LAB — CUP PACEART REMOTE DEVICE CHECK
Date Time Interrogation Session: 20210725232751
Implantable Pulse Generator Implant Date: 20190624

## 2020-06-19 NOTE — Progress Notes (Signed)
[facility] Summary Report / Loop Recorder 

## 2020-07-22 LAB — CUP PACEART REMOTE DEVICE CHECK
Date Time Interrogation Session: 20210827233838
Implantable Pulse Generator Implant Date: 20190624

## 2020-07-23 ENCOUNTER — Ambulatory Visit (INDEPENDENT_AMBULATORY_CARE_PROVIDER_SITE_OTHER): Payer: Medicare Other | Admitting: *Deleted

## 2020-07-23 DIAGNOSIS — G459 Transient cerebral ischemic attack, unspecified: Secondary | ICD-10-CM

## 2020-07-24 NOTE — Progress Notes (Signed)
[facility] Summary Report / Loop Recorder 

## 2020-08-23 LAB — CUP PACEART REMOTE DEVICE CHECK
Date Time Interrogation Session: 20210929233642
Implantable Pulse Generator Implant Date: 20190624

## 2020-08-27 ENCOUNTER — Ambulatory Visit (INDEPENDENT_AMBULATORY_CARE_PROVIDER_SITE_OTHER): Payer: Medicare Other

## 2020-08-27 DIAGNOSIS — G459 Transient cerebral ischemic attack, unspecified: Secondary | ICD-10-CM | POA: Diagnosis not present

## 2020-08-28 NOTE — Progress Notes (Signed)
[facility] Summary Report / Loop Recorder 

## 2020-08-29 ENCOUNTER — Encounter: Payer: Self-pay | Admitting: Cardiology

## 2020-08-29 ENCOUNTER — Other Ambulatory Visit: Payer: Self-pay

## 2020-08-29 ENCOUNTER — Ambulatory Visit (INDEPENDENT_AMBULATORY_CARE_PROVIDER_SITE_OTHER): Payer: Medicare Other | Admitting: Cardiology

## 2020-08-29 VITALS — BP 130/71 | HR 52 | Temp 97.5°F | Ht 66.5 in | Wt 164.0 lb

## 2020-08-29 DIAGNOSIS — Z8673 Personal history of transient ischemic attack (TIA), and cerebral infarction without residual deficits: Secondary | ICD-10-CM | POA: Diagnosis not present

## 2020-08-29 DIAGNOSIS — I480 Paroxysmal atrial fibrillation: Secondary | ICD-10-CM | POA: Diagnosis not present

## 2020-08-29 DIAGNOSIS — Z7189 Other specified counseling: Secondary | ICD-10-CM | POA: Diagnosis not present

## 2020-08-29 DIAGNOSIS — I10 Essential (primary) hypertension: Secondary | ICD-10-CM

## 2020-08-29 DIAGNOSIS — I872 Venous insufficiency (chronic) (peripheral): Secondary | ICD-10-CM

## 2020-08-29 DIAGNOSIS — Z79899 Other long term (current) drug therapy: Secondary | ICD-10-CM | POA: Diagnosis not present

## 2020-08-29 DIAGNOSIS — E041 Nontoxic single thyroid nodule: Secondary | ICD-10-CM | POA: Diagnosis not present

## 2020-08-29 DIAGNOSIS — M8580 Other specified disorders of bone density and structure, unspecified site: Secondary | ICD-10-CM | POA: Diagnosis not present

## 2020-08-29 DIAGNOSIS — Z23 Encounter for immunization: Secondary | ICD-10-CM | POA: Diagnosis not present

## 2020-08-29 NOTE — Progress Notes (Signed)
Cardiology Office Note:    Date:  08/29/2020   ID:  Kristin Lowe, DOB 1941/09/06, MRN [id number]  PCP:  Kristin Infante, MD  Cardiologist:  Kristin Dresser, MD  EP: Dr. Caryl Lowe  Referring MD: Kristin Infante, MD   CC: new patient evaluation for history of PVCs and CVA  History of Present Illness:    Kristin Lowe is a 79 y.o. female with a hx of cryptogenic stroke, hypertension, sinus bradycardia, paroxysmal atrial fibrillation on anticoagulation, venous insufficiency who is seen as a new consult at the request of Kristin Infante, MD for the evaluation and management of PVCs. She follows with Dr. Caryl Lowe and has an implantable loop recorder for evaluation of cryptogenic stroke.  I have only a brief referral note from Dr. Joylene Lowe available today, noted that patient requested to see a cardiologist.   She had a doctor in [facility] for 12 years, and then she saw Dr. Einar Gip several years ago. She met Dr. Caryl Lowe after cryptogenic stroke and had ILR implanted. We reviewed what an ILR can do, what a pacemaker can do, etc. We reviewed her actual report from her last interrogation together.  Brings a copy of her last note printed 05/15/2016 from her doctor in [place]. Noted that echo 10/2014 showed mitral valve prolapse, had a thallium 2015 that was negative. Had Holter 04/2013 without significant arrhythmias.  Reported intermittent PVCs in the past.   Cardiovascular risk factors: Prior clinical ASCVD: history of cryptogenic stroke, s/p ILR Comorbid conditions: A1c was 5.5, no formal diabetes diagnosis. Has had diet controlled borderline hypertension. No CKD that she knows of. Metabolic syndrome/Obesity: BMI 26 Chronic inflammatory conditions: none Tobacco use history: former, quit almost 20 years ago. Had been an on and off smoker for years prior. Family history: father had heart disease. Never really went to the doctor, died while loading wood on a truck at age 49, thought to be a massive heart  attack. Prior cardiac testing and/or incidental findings on other testing (ie coronary calcium): TTE/TEE with CVA in 2019.  Exercise level: very active, see below. Diet; cutting out sweets, working to avoid diabetes. Alcohol: drinks occasional bourbon and/or beer  Has history of LE edema, especially after being on her feet for 8-10 hours. Reviewed compression stockings, venous insufficiency today.  Denies chest pain, shortness of breath at rest or with normal exertion. No PND, orthopnea, LE edema or unexpected weight gain. No syncope or palpitations.  Sleeps alone, not sure if she snores. Wakes up tired in the morning, even if she gets a lot of sleep. No morning headaches. Felt better after walking dogs for 2 miles in the morning. Walks 10k steps/day as a goal. Used to ride 10-20 miles/day on a bike when she lived in [place].   Tolerating apixaban well, no bleeding issues. Tolerating atorvastatin as well.   Past Medical History:  Diagnosis Date  . Bradycardia   . Hypertension   . Paroxysmal atrial fibrillation (HCC)   . Stroke (cerebrum) [facility])     Past Surgical History:  Procedure Laterality Date  . CATARACT EXTRACTION, BILATERAL    . LOOP RECORDER INSERTION N/A 05/17/2018   Procedure: LOOP RECORDER INSERTION;  Surgeon: Deboraha Sprang, MD;  Location: [facility] [facility];  Service: Cardiovascular;  Laterality: N/A;  . LOOP RECORDER INSERTION  05/17/2018   Procedure: LOOP RECORDER INSERTION;  Surgeon: Fay Records, MD;  Location: [facility];  Service: Cardiovascular;;  . TEE WITHOUT CARDIOVERSION N/A 05/17/2018   Procedure: TRANSESOPHAGEAL ECHOCARDIOGRAM (  TEE);  Surgeon: Fay Records, MD;  Location: [facility];  Service: Cardiovascular;  Laterality: N/A;  . TRIGGER FINGER RELEASE    . TUMOR REMOVAL      Current Medications: Current Outpatient Medications on File Prior to Visit  Medication Sig  . apixaban (ELIQUIS) 5 MG TABS tablet Take 1 tablet (5 mg total) by mouth 2 (two) times  daily.  [name] Kitchen atorvastatin (LIPITOR) 40 MG tablet Take 1 tablet (40 mg total) by mouth daily at 6 PM.  . azelastine (ASTELIN) 0.1 % nasal spray Place 2 sprays into the nose 2 (two) times daily.  . Cholecalciferol (VITAMIN D) 2000 units CAPS Take 2,000 Units by mouth daily.   . clobetasol ointment (TEMOVATE) 0.05 % as needed.  [name] Kitchen co-enzyme Q-10 50 MG capsule Take 100 mg by mouth daily.  [name] Kitchen levOCARNitine (CARNITINE, L,) POWD 1 tablet by Does not apply route daily.  . Magnesium 250 MG TABS Take 250 mg by mouth daily.  . Multiple Vitamin (MULTIVITAMIN) tablet Take 1 tablet by mouth daily.  . Multiple Vitamins-Calcium (ADVANCED AM/PM PO) Take 1 tablet by mouth daily.  . Multiple Vitamins-Minerals (IMMUNE SUPPORT PO) Take 1 tablet by mouth daily.  . Omega-3 1000 MG CAPS Take 1 capsule by mouth daily.  . Triamcinolone Acetonide (NASACORT AQ NA) Place 1 spray into both nostrils daily as needed (allergies).   . TURMERIC PO Take 1 capsule by mouth daily.  . verapamil (CALAN) 40 MG tablet Take 48m, one tablet, up to one time daily for episodes of A Fib RVR.   No current facility-administered medications on file prior to visit.     Allergies:   Patient has no known allergies.   Social History   Tobacco Use  . Smoking status: Former SResearch scientist (life sciences) . Smokeless tobacco: Never Used  Vaping Use  . Vaping Use: Never used  Substance Use Topics  . Alcohol use: No  . Drug use: Never    Family History: family history includes Breast cancer in her mother; Diabetes in her brother and mother; Heart disease in her father; Hypertension in her brother and sister; Thyroid disease in her mother and sister.  ROS:   Please see the history of present illness.  Additional pertinent ROS: Constitutional: Negative for chills, fever, night sweats, unintentional weight loss  HENT: Negative for ear pain and hearing loss.   Eyes: Negative for loss of vision and eye pain.  Respiratory: Negative for cough, sputum, wheezing.     Cardiovascular: See HPI. Gastrointestinal: Negative for abdominal pain, melena, and hematochezia.  Genitourinary: Negative for dysuria and hematuria.  Musculoskeletal: Negative for falls and myalgias.  Skin: Negative for itching and rash.  Neurological: Negative for focal weakness, focal sensory changes and loss of consciousness.  Endo/Heme/Allergies: Does not bruise/bleed easily.     EKGs/Labs/Other Studies Reviewed:    The following studies were reviewed today: Echo 05/16/18 - Left ventricle: The cavity size was normal. Systolic function was  normal. The estimated ejection fraction was in the range of 55%  to 60%. Wall motion was normal; there were no regional wall  motion abnormalities.  - Mitral valve: There was mild regurgitation.   Impressions:   - No cardiac source of emboli was indentified.  TEE 05/17/18 -no significant abnormalities, mild plaque in thoracic aorta  EKG:  EKG is personally reviewed.  The ekg ordered today demonstrates sinus bradycardia at 52 bpm, iRBBB  Recent Labs: No results found for requested labs within last 8760 hours.  Recent Lipid  Panel    Component Value Date/Time   CHOL 182 05/15/2018 2119   TRIG 115 05/15/2018 2119   HDL 53 05/15/2018 2119   CHOLHDL 3.4 05/15/2018 2119   VLDL 23 05/15/2018 2119   LDLCALC 106 (H) 05/15/2018 2119    Physical Exam:    VS:  BP 130/71   Pulse (!) 52   Temp (!) 97.5 F (36.4 C)   Ht 5' 6.5" (1.689 m)   Wt 164 lb (74.4 kg)   SpO2 97%   BMI 26.07 kg/m     Wt Readings from Last 3 Encounters:  08/29/20 164 lb (74.4 kg)  04/12/20 167 lb (75.8 kg)  11/03/19 164 lb (74.4 kg)    GEN: Well nourished, well developed in no acute distress HEENT: Normal, moist mucous membranes NECK: No JVD CARDIAC: regular rhythm, normal S1 and S2, no rubs or gallops. No murmurs. VASCULAR: Radial and DP pulses 2+ bilaterally. No carotid bruits RESPIRATORY:  Clear to auscultation without rales, wheezing or rhonchi   ABDOMEN: Soft, non-tender, non-distended MUSCULOSKELETAL:  Ambulates independently SKIN: Warm and dry, no edema today NEUROLOGIC:  Alert and oriented x 3. No focal neuro deficits noted. PSYCHIATRIC:  Normal affect    ASSESSMENT:    1. Paroxysmal atrial fibrillation (HCC)   2. History of CVA (cerebrovascular accident)   3. Essential hypertension   4. Venous insufficiency   5. Cardiac risk counseling   6. Counseling on health promotion and disease prevention    PLAN:    History of CVA Rare paroxysmal atrial fibrillation Reported prior history of PVCs (none seen today) -now on apixaban, tolerating -we reviewed her most recent ILR report, including heart rate and arrhythmia trends -has verapamil PRN, but has low resting heart rate. Has not had a prolonged episode of atrial fibrillation that has required treatement -continue atorvastatin -no aspirin as she is on apixaban  Hypertension:  -blood pressure just at goal today -on no agents  Intermittent LE edema: none today, but reports worse at the end of the day/being on her feet all day -reviewed chronic venous insufficiency today -counseled on compression stockings, elevated  Cardiac risk counseling and prevention recommendations: -recommend heart healthy/Mediterranean diet, with whole grains, fruits, vegetable, fish, lean meats, nuts, and olive oil. Limit salt. -recommend moderate walking, 3-5 times/week for 30-50 minutes each session. Aim for at least 150 minutes.week. Goal should be pace of 3 miles/hours, or walking 1.5 miles in 30 minutes -recommend avoidance of tobacco products. Avoid excess alcohol.  Plan for follow up: 6 mos, then would see annually  Kristin Dresser, MD, PhD [place]  [facility]    Medication Adjustments/Labs and Tests Ordered: Current medicines are reviewed at length with the patient today.  Concerns regarding medicines are outlined above.  Orders Placed This Encounter  Procedures  .  EKG 12-Lead   No orders of the defined types were placed in this encounter.   Patient Instructions  Medication Instructions:  Your Physician recommend you continue on your current medication as directed.    *If you need a refill on your cardiac medications before your next appointment, please call your pharmacy*   Lab Work: None ordered   Testing/Procedures: None ordered    Follow-Up: At [facility], you and your health needs are our priority.  As part of our continuing mission to provide you with exceptional heart care, we have created designated Provider Care Teams.  These Care Teams include your primary Cardiologist (physician) and Advanced Practice Providers (APPs -  Physician Assistants and Nurse Practitioners) who all work together to provide you with the care you need, when you need it.  We recommend signing up for the patient portal called "MyChart".  Sign up information is provided on this After Visit Summary.  MyChart is used to connect with patients for Virtual Visits (Telemedicine).  Patients are able to view lab/test results, encounter notes, upcoming appointments, etc.  Non-urgent messages can be sent to your provider as well.   To learn more about what you can do with MyChart, go to [email or web address].    Your next appointment:   6 month(s)  The format for your next appointment:   In Person  Provider:   Buford Dresser, MD      Signed, Kristin Dresser, MD PhD 08/29/2020 1:51 PM    [place]

## 2020-08-29 NOTE — Patient Instructions (Signed)

## 2020-09-17 ENCOUNTER — Other Ambulatory Visit: Payer: Self-pay | Admitting: Internal Medicine

## 2020-09-17 DIAGNOSIS — I6389 Other cerebral infarction: Secondary | ICD-10-CM | POA: Diagnosis not present

## 2020-09-17 DIAGNOSIS — D6869 Other thrombophilia: Secondary | ICD-10-CM | POA: Diagnosis not present

## 2020-09-17 DIAGNOSIS — E041 Nontoxic single thyroid nodule: Secondary | ICD-10-CM | POA: Diagnosis not present

## 2020-09-17 DIAGNOSIS — M8580 Other specified disorders of bone density and structure, unspecified site: Secondary | ICD-10-CM | POA: Diagnosis not present

## 2020-09-17 DIAGNOSIS — R001 Bradycardia, unspecified: Secondary | ICD-10-CM | POA: Diagnosis not present

## 2020-09-17 DIAGNOSIS — Z0000 Encounter for general adult medical examination without abnormal findings: Secondary | ICD-10-CM | POA: Diagnosis not present

## 2020-09-17 DIAGNOSIS — I493 Ventricular premature depolarization: Secondary | ICD-10-CM | POA: Diagnosis not present

## 2020-09-17 DIAGNOSIS — R030 Elevated blood-pressure reading, without diagnosis of hypertension: Secondary | ICD-10-CM | POA: Diagnosis not present

## 2020-09-17 DIAGNOSIS — R82998 Other abnormal findings in urine: Secondary | ICD-10-CM | POA: Diagnosis not present

## 2020-09-17 DIAGNOSIS — I480 Paroxysmal atrial fibrillation: Secondary | ICD-10-CM | POA: Diagnosis not present

## 2020-09-17 DIAGNOSIS — I444 Left anterior fascicular block: Secondary | ICD-10-CM | POA: Diagnosis not present

## 2020-09-19 DIAGNOSIS — Z23 Encounter for immunization: Secondary | ICD-10-CM | POA: Diagnosis not present

## 2020-09-26 ENCOUNTER — Ambulatory Visit
Admission: RE | Admit: 2020-09-26 | Discharge: 2020-09-26 | Disposition: A | Discharge status: Home / Self Care | Payer: Medicare Other | Source: Ambulatory Visit | Attending: Internal Medicine | Admitting: Internal Medicine

## 2020-09-26 DIAGNOSIS — E041 Nontoxic single thyroid nodule: Secondary | ICD-10-CM | POA: Diagnosis not present

## 2020-09-26 LAB — CUP PACEART REMOTE DEVICE CHECK
Date Time Interrogation Session: 20211101233722
Implantable Pulse Generator Implant Date: 20190624

## 2020-10-01 ENCOUNTER — Ambulatory Visit (INDEPENDENT_AMBULATORY_CARE_PROVIDER_SITE_OTHER): Payer: Medicare Other

## 2020-10-01 DIAGNOSIS — Z8673 Personal history of transient ischemic attack (TIA), and cerebral infarction without residual deficits: Secondary | ICD-10-CM

## 2020-10-01 NOTE — Progress Notes (Signed)
[facility] Summary Report / Loop Recorder 

## 2020-10-24 ENCOUNTER — Telehealth: Payer: Self-pay | Admitting: Internal Medicine

## 2020-10-24 NOTE — Telephone Encounter (Signed)
Patient is calling in stating that she is going to have a dental procedure and is concerned about stopping her Eliquis. He dentist recommended that a specialist would be able to stop potential bleeding better than he could if it were to occur. Patient is wondering if the clearance team will advise on if she should go to her regular dentist versus a specialist to have a tooth extracted. I advised the patient that who does the procedure is not something her Cardiologist should be advising on. If he dentist is not comfortable, then her dentist should refer her to the specialist. Informed patient that once the clearance request was received by her dental office, it would be reviewed and our clearance team would then advise on how long she should stop her Eliquis depending on type of procedure. Patient verbalized understanding and expressed concerns regarding stopping her Eliquis, even for a short period of time.

## 2020-10-24 NOTE — Telephone Encounter (Signed)
Patient states that she is getting a dental procedure done soon - that office will be requesting the clearance - but she would like to speak with Dr. Olin Pia nurse regarding whether she should have the procedure done at a specialist instead of the regular dentist. She states the dentist mentioned that a specialist would be able to stop any bleeding better than they could and she is concerned. Please call/advise  Thank you!

## 2020-10-24 NOTE — Telephone Encounter (Signed)
   [facility] Pre-operative Risk Assessment    HEARTCARE STAFF: - Please ensure there is not already an duplicate clearance open for this procedure. - Under Visit Info/Reason for Call, type in Other and utilize the format Clearance MM/DD/YY or Clearance TBD. Do not use dashes or single digits. - If request is for dental extraction, please clarify the # of teeth to be extracted.  Request for surgical clearance:  1. What type of surgery is being performed? 1 TOOTH TO BE EXTRACTED; OR POSSIBLY MAY HAVE CROWN PLACED INSTEAD OF TOOTH BEING EXTRACTED. WILL BE DECIDED AT TIME OF APPT.  2. When is this surgery scheduled? TBD   3. What type of clearance is required (medical clearance vs. Pharmacy clearance to hold med vs. Both)? BOTH  4. Are there any medications that need to be held prior to surgery and how long? ELIQUIS   5. Practice name and name of physician performing surgery? [facility]; DR. Noberto Retort III DMD   6. What is the office phone number? [phone]   7.   What is the office fax number? [phone]  8.   Anesthesia type (None, local, MAC, general) ? LOCAL   Julaine Hua 10/24/2020, 2:50 PM  _________________________________________________________________   (provider comments below)

## 2020-10-24 NOTE — Telephone Encounter (Signed)
   Primary Cardiologist: Buford Dresser, MD  Chart reviewed as part of pre-operative protocol coverage.   Simple dental extractions are considered low risk procedures per guidelines and generally do not require any specific cardiac clearance. It is also generally accepted that for simple extractions and dental cleanings, there is no need to interrupt blood thinner therapy.   SBE prophylaxis is not required for the patient from a cardiac standpoint.  I will route this recommendation to the requesting party via Epic fax function and remove from pre-op pool.  Please call with questions.  Tami Lin Rani Sisney, PA 10/24/2020, 3:28 PM

## 2020-10-30 DIAGNOSIS — H5212 Myopia, left eye: Secondary | ICD-10-CM | POA: Diagnosis not present

## 2020-10-30 DIAGNOSIS — Z1212 Encounter for screening for malignant neoplasm of rectum: Secondary | ICD-10-CM | POA: Diagnosis not present

## 2020-10-30 DIAGNOSIS — H5201 Hypermetropia, right eye: Secondary | ICD-10-CM | POA: Diagnosis not present

## 2020-10-30 DIAGNOSIS — Z961 Presence of intraocular lens: Secondary | ICD-10-CM | POA: Diagnosis not present

## 2020-11-03 LAB — CUP PACEART REMOTE DEVICE CHECK
Date Time Interrogation Session: 20211204224223
Implantable Pulse Generator Implant Date: 20190624

## 2020-11-05 ENCOUNTER — Ambulatory Visit (INDEPENDENT_AMBULATORY_CARE_PROVIDER_SITE_OTHER): Payer: Medicare Other

## 2020-11-05 DIAGNOSIS — Z8673 Personal history of transient ischemic attack (TIA), and cerebral infarction without residual deficits: Secondary | ICD-10-CM | POA: Diagnosis not present

## 2020-11-06 ENCOUNTER — Other Ambulatory Visit: Payer: Self-pay | Admitting: Internal Medicine

## 2020-11-06 DIAGNOSIS — Z1231 Encounter for screening mammogram for malignant neoplasm of breast: Secondary | ICD-10-CM

## 2020-11-14 ENCOUNTER — Telehealth: Payer: Self-pay | Admitting: *Deleted

## 2020-11-14 NOTE — Telephone Encounter (Signed)
   Primary Cardiologist: Buford Dresser, MD  Chart reviewed as part of pre-operative protocol coverage. Simple dental extractions are considered low risk procedures per guidelines and generally do not require any specific cardiac clearance. It is also generally accepted that for simple extractions and dental cleanings, there is no need to interrupt blood thinner therapy.   SBE prophylaxis is not required for the patient.  I will route this recommendation to the requesting party via Epic fax function and remove from pre-op pool.  Please call with questions.  Deberah Pelton, NP 11/14/2020, 2:47 PM

## 2020-11-14 NOTE — Telephone Encounter (Signed)
Patient calling to speak with Denyse Amass in regards to this clearance that was faxed to her dentist office.

## 2020-11-14 NOTE — Telephone Encounter (Signed)
   [facility] Pre-operative Risk Assessment    HEARTCARE STAFF: - Please ensure there is not already an duplicate clearance open for this procedure. - Under Visit Info/Reason for Call, type in Other and utilize the format Clearance MM/DD/YY or Clearance TBD. Do not use dashes or single digits. - If request is for dental extraction, please clarify the # of teeth to be extracted.  Request for surgical clearance:  1. What type of surgery is being performed? 1 TOOTH EXTRACTION; PT MAY POSSIBLY ALSO HAVE A CROWN PLACED; THOUGH THIS WILL BE DECIDED BY THE PT AT HER VISIT WITH DR. Noberto Retort, DMD   2. When is this surgery scheduled? TBD   3. What type of clearance is required (medical clearance vs. Pharmacy clearance to hold med vs. Both)? BOTH  4. Are there any medications that need to be held prior to surgery and how long? ELIQUIS   5. Practice name and name of physician performing surgery? [facility]; DR. Noberto Retort, DMD   6. What is the office phone number? [phone]   7.   What is the office fax number? [phone]  8.   Anesthesia type (None, local, MAC, general) ? NONE TO BE USED AT THIS TIME PER [facility]   Julaine Hua 11/14/2020, 2:24 PM  _________________________________________________________________   (provider comments below)

## 2020-11-15 NOTE — Telephone Encounter (Signed)
Contacted patient about concerns with not holding Eliquis prior to single tooth extraction.  I explained that with simple dental procedures no need for holding blood thinning products as needed.  Patient expressed understanding.  No further questions at this time.

## 2020-11-19 NOTE — Progress Notes (Signed)
[facility] Summary Report / Loop Recorder 

## 2020-11-23 ENCOUNTER — Encounter (HOSPITAL_COMMUNITY): Payer: Self-pay

## 2020-11-23 ENCOUNTER — Other Ambulatory Visit: Payer: Self-pay

## 2020-11-23 ENCOUNTER — Ambulatory Visit (HOSPITAL_COMMUNITY)
Admission: EM | Admit: 2020-11-23 | Discharge: 2020-11-23 | Disposition: A | Discharge status: Home / Self Care | Payer: Medicare Other | Attending: Student | Admitting: Student

## 2020-11-23 DIAGNOSIS — S80811A Abrasion, right lower leg, initial encounter: Secondary | ICD-10-CM | POA: Diagnosis not present

## 2020-11-23 MED ORDER — CEPHALEXIN 500 MG PO CAPS: 500.0000 mg | ORAL_CAPSULE | Freq: Four times a day (QID) | ORAL | 0 refills | Status: DC

## 2020-11-23 NOTE — ED Triage Notes (Signed)
Pt reports she tripped 8 days ago and scrapped the skin in the right leg. States the area is red feels tight. Reports her PCP sent her for evaluation.

## 2020-11-23 NOTE — ED Provider Notes (Addendum)
[facility]    CSN: [id number] Arrival date & time: 11/23/20  5956      History   Chief Complaint Chief Complaint  Patient presents with  . Abrasion    HPI Kristin Lowe is a 79 y.o. female presenting for abrasion on right shin. History of bradycardia, hypertension, afib, stroke. Presenting today for abrasion. She tripped 8 days ago and scraped right leg. 1 day ago, the area got red and felt tight, though this is actually somewhat improved today. States her PCP wanted her to get evaluated. Denies fevers/chills, shortness of breath, chest pain. Denies injury elsewhere, denies falls.  HPI  Past Medical History:  Diagnosis Date  . Bradycardia   . Hypertension   . Paroxysmal atrial fibrillation (HCC)   . Stroke (cerebrum) [facility])     Patient Active Problem List   Diagnosis Date Noted  . History of CVA (cerebrovascular accident) 08/29/2020  . Paroxysmal atrial fibrillation (HCC) 08/29/2020  . TIA (transient ischemic attack) 05/15/2018  . Sinus bradycardia 05/15/2018  . Essential hypertension 05/15/2018  . Hyperglycemia 05/15/2018  . Multiple thyroid nodules 09/17/2017  . Other fatigue 09/17/2017    Past Surgical History:  Procedure Laterality Date  . CATARACT EXTRACTION, BILATERAL    . LOOP RECORDER INSERTION N/A 05/17/2018   Procedure: LOOP RECORDER INSERTION;  Surgeon: Duke Salvia, MD;  Location: [facility] INVASIVE CV LAB;  Service: Cardiovascular;  Laterality: N/A;  . LOOP RECORDER INSERTION  05/17/2018   Procedure: LOOP RECORDER INSERTION;  Surgeon: Pricilla Riffle, MD;  Location: [facility] ENDOSCOPY;  Service: Cardiovascular;;  . TEE WITHOUT CARDIOVERSION N/A 05/17/2018   Procedure: TRANSESOPHAGEAL ECHOCARDIOGRAM (TEE);  Surgeon: Pricilla Riffle, MD;  Location: [facility] ENDOSCOPY;  Service: Cardiovascular;  Laterality: N/A;  . TRIGGER FINGER RELEASE    . TUMOR REMOVAL      OB History   No obstetric history on file.      Home Medications    Prior to Admission  medications   Medication Sig Start Date End Date Taking? Authorizing Provider  cephALEXin (KEFLEX) 500 MG capsule Take 1 capsule (500 mg total) by mouth 4 (four) times daily. 11/23/20  Yes Rhys Martini, PA-C  apixaban (ELIQUIS) 5 MG TABS tablet Take 1 tablet (5 mg total) by mouth 2 (two) times daily. 12/14/19   Duke Salvia, MD  atorvastatin (LIPITOR) 40 MG tablet Take 1 tablet (40 mg total) by mouth daily at 6 PM. 05/17/18   Danford, Earl Lites, MD  azelastine (ASTELIN) 0.1 % nasal spray Place 2 sprays into the nose 2 (two) times daily.    [provider]  Cholecalciferol (VITAMIN D) 2000 units CAPS Take 2,000 Units by mouth daily.     [provider]  clobetasol ointment (TEMOVATE) 0.05 % as needed.    [provider]  co-enzyme Q-10 50 MG capsule Take 100 mg by mouth daily.    [provider]  levOCARNitine (CARNITINE, L,) POWD 1 tablet by Does not apply route daily.    [provider]  Magnesium 250 MG TABS Take 250 mg by mouth daily.    [provider]  Multiple Vitamin (MULTIVITAMIN) tablet Take 1 tablet by mouth daily.    [provider]  Multiple Vitamins-Calcium (ADVANCED AM/PM PO) Take 1 tablet by mouth daily.    [provider]  Multiple Vitamins-Minerals (IMMUNE SUPPORT PO) Take 1 tablet by mouth daily.    [provider]  Omega-3 1000 MG CAPS Take 1 capsule by mouth  daily.    [provider]  Triamcinolone Acetonide (NASACORT AQ NA) Place 1 spray into both nostrils daily as needed (allergies).     [provider]  TURMERIC PO Take 1 capsule by mouth daily.    [provider]  verapamil (CALAN) 40 MG tablet Take 40mg , one tablet, up to one time daily for episodes of A Fib RVR. 03/01/20   Sherran Needs, NP    Family History Family History  Problem Relation Age of Onset  . Breast cancer Mother   . Diabetes Mother   . Thyroid disease Mother   . Heart disease Father    . Hypertension Sister   . Thyroid disease Sister   . Diabetes Brother   . Hypertension Brother     Social History Social History   Tobacco Use  . Smoking status: Former Research scientist (life sciences)  . Smokeless tobacco: Never Used  Vaping Use  . Vaping Use: Never used  Substance Use Topics  . Alcohol use: No  . Drug use: Never     Allergies   Patient has no known allergies.   Review of Systems Review of Systems  Skin: Positive for wound.  All other systems reviewed and are negative.    Physical Exam Triage Vital Signs ED Triage Vitals  Enc Vitals Group     BP 11/23/20 0834 (!) 158/76     Pulse Rate 11/23/20 0834 (!) 56     Resp 11/23/20 0834 18     Temp 11/23/20 0834 98.6 F (37 C)     Temp Source 11/23/20 0834 Oral     SpO2 11/23/20 0834 96 %     Weight --      Height --      Head Circumference --      Peak Flow --      Pain Score 11/23/20 0833 0     Pain Loc --      Pain Edu? --      Excl. in [place]? --    No data found.  Updated Vital Signs BP (!) 158/76 (BP Location: Right Arm)   Pulse (!) 56   Temp 98.6 F (37 C) (Oral)   Resp 18   SpO2 96%   Visual Acuity Right Eye Distance:   Left Eye Distance:   Bilateral Distance:    Right Eye Near:   Left Eye Near:    Bilateral Near:     Physical Exam Vitals reviewed.  Constitutional:      Appearance: Normal appearance.  HENT:     Head: Normocephalic and atraumatic.  Cardiovascular:     Rate and Rhythm: Normal rate and regular rhythm.     Heart sounds: Normal heart sounds.  Pulmonary:     Effort: Pulmonary effort is normal.     Breath sounds: Normal breath sounds.  Skin:    Findings: Abrasion present.     Comments: R shin with 2cm x 2cm abrasion. 1cm of erythema surrounding wound and 2cm x2cm of erythema with very mild swelling immediately under wound that is slightly warm and tender. No discharge.   Neurological:     General: No focal deficit present.     Mental Status: She is alert and oriented to person,  place, and time.  Psychiatric:        Mood and Affect: Mood normal.        Behavior: Behavior normal.        Thought Content: Thought content normal.  Judgment: Judgment normal.      UC Treatments / Results  Labs (all labs ordered are listed, but only abnormal results are displayed) Labs Reviewed - No data to display  EKG   Radiology No results found.  Procedures Procedures (including critical care time)  Medications Ordered in UC Medications - No data to display  Initial Impression / Assessment and Plan / UC Course  I have reviewed the triage vital signs and the nursing notes.  Pertinent labs & imaging results that were available during my care of the patient were reviewed by me and considered in my medical decision making (see chart for details).     She is afebrile nontachycardic nontachypneic. Keflex as below. Return precautions- fevers/chills, chest pain, shortness of breath, etc. Wound care instructions provided.  Final Clinical Impressions(s) / UC Diagnoses   Final diagnoses:  Abrasion of right leg, initial encounter   Discharge Instructions   None    ED Prescriptions    Medication Sig Dispense Auth. Provider   cephALEXin (KEFLEX) 500 MG capsule Take 1 capsule (500 mg total) by mouth 4 (four) times daily. 20 capsule Hazel Sams, PA-C     PDMP not reviewed this encounter.   Hazel Sams, PA-C 11/23/20 0926    Hazel Sams, PA-C 11/23/20 [phone]

## 2020-11-23 NOTE — Discharge Instructions (Addendum)
Continue to change bandage and apply antibacterial ointment 1-2x daily. Take your antibiotic (Keflex) 4x daily for 5 days. Seek additional medical attention if you develop fevers/chills, chest pain, shortness of breaht.

## 2020-11-29 ENCOUNTER — Ambulatory Visit
Admission: RE | Admit: 2020-11-29 | Discharge: 2020-11-29 | Disposition: A | Discharge status: Home / Self Care | Payer: Medicare Other | Source: Ambulatory Visit | Attending: Internal Medicine | Admitting: Internal Medicine

## 2020-11-29 ENCOUNTER — Other Ambulatory Visit: Payer: Self-pay

## 2020-11-29 DIAGNOSIS — Z1231 Encounter for screening mammogram for malignant neoplasm of breast: Secondary | ICD-10-CM

## 2020-12-10 ENCOUNTER — Ambulatory Visit (INDEPENDENT_AMBULATORY_CARE_PROVIDER_SITE_OTHER): Payer: Medicare Other

## 2020-12-10 DIAGNOSIS — I480 Paroxysmal atrial fibrillation: Secondary | ICD-10-CM

## 2020-12-10 LAB — CUP PACEART REMOTE DEVICE CHECK
Date Time Interrogation Session: 20220115235529
Implantable Pulse Generator Implant Date: 20190624

## 2020-12-12 DIAGNOSIS — L820 Inflamed seborrheic keratosis: Secondary | ICD-10-CM | POA: Diagnosis not present

## 2020-12-12 DIAGNOSIS — D224 Melanocytic nevi of scalp and neck: Secondary | ICD-10-CM | POA: Diagnosis not present

## 2020-12-12 DIAGNOSIS — D692 Other nonthrombocytopenic purpura: Secondary | ICD-10-CM | POA: Diagnosis not present

## 2020-12-12 DIAGNOSIS — D225 Melanocytic nevi of trunk: Secondary | ICD-10-CM | POA: Diagnosis not present

## 2020-12-12 DIAGNOSIS — L821 Other seborrheic keratosis: Secondary | ICD-10-CM | POA: Diagnosis not present

## 2020-12-12 DIAGNOSIS — L814 Other melanin hyperpigmentation: Secondary | ICD-10-CM | POA: Diagnosis not present

## 2020-12-22 NOTE — Progress Notes (Signed)
[facility] Summary Report / Loop Recorder 

## 2020-12-26 DIAGNOSIS — G5601 Carpal tunnel syndrome, right upper limb: Secondary | ICD-10-CM | POA: Diagnosis not present

## 2020-12-26 DIAGNOSIS — M25512 Pain in left shoulder: Secondary | ICD-10-CM | POA: Diagnosis not present

## 2021-01-13 LAB — CUP PACEART REMOTE DEVICE CHECK
Date Time Interrogation Session: 20220217235836
Implantable Pulse Generator Implant Date: 20190624

## 2021-01-14 ENCOUNTER — Ambulatory Visit (INDEPENDENT_AMBULATORY_CARE_PROVIDER_SITE_OTHER): Payer: Medicare Other

## 2021-01-14 DIAGNOSIS — Z8673 Personal history of transient ischemic attack (TIA), and cerebral infarction without residual deficits: Secondary | ICD-10-CM

## 2021-01-17 NOTE — Progress Notes (Signed)
[facility] Summary Report / Loop Recorder 

## 2021-02-16 LAB — CUP PACEART REMOTE DEVICE CHECK
Date Time Interrogation Session: 20220323010005
Implantable Pulse Generator Implant Date: 20190624

## 2021-02-18 ENCOUNTER — Telehealth: Payer: Self-pay

## 2021-02-18 ENCOUNTER — Ambulatory Visit (INDEPENDENT_AMBULATORY_CARE_PROVIDER_SITE_OTHER): Payer: Medicare Other

## 2021-02-18 DIAGNOSIS — G459 Transient cerebral ischemic attack, unspecified: Secondary | ICD-10-CM

## 2021-02-18 MED ORDER — APIXABAN 5 MG PO TABS: 5.0000 mg | ORAL_TABLET | Freq: Two times a day (BID) | ORAL | 0 refills | Status: DC

## 2021-02-18 NOTE — Telephone Encounter (Signed)
[facility] [address] [place]. [phone] is the best contact number to reach patient. She request to have this be her new pharmacy.

## 2021-02-18 NOTE — Telephone Encounter (Signed)
Pt last saw Dr Harrell Gave 08/29/20, last labs 08/25/19 Creat 0.7 at [facility], overdue for follow-up labwork.  Age 80 weight 74.4kg, based on specified criteria pt is on appropriate dosage of Eliquis 5mg  BID.  Pt has upcoming appt 04/03/21 with Dr [name] will need BMP and CBC done at that time.  Will refill Eli uis to get pt to appt, then reassess dosage based on more recent lab results.

## 2021-02-27 NOTE — Progress Notes (Signed)
[facility] Summary Report / Loop Recorder 

## 2021-03-07 ENCOUNTER — Telehealth: Payer: Self-pay | Admitting: Student in an Organized Health Care Education/Training Program

## 2021-03-07 NOTE — Telephone Encounter (Signed)
Paged by operator that patient had urgent medication question. Kristin Lowe had taken double doses of eliquis and atorvastatin. Discussed the risks of taking eliquis 10 mg PO x1 and atorvastatin 80 mg PO x1 are very small and really is just a small increase in bleeding risk above that of her baseline. Instructed her to continue medications as prescribed tomorrow morning. She has no further questions.

## 2021-03-15 DIAGNOSIS — M25562 Pain in left knee: Secondary | ICD-10-CM | POA: Diagnosis not present

## 2021-03-18 ENCOUNTER — Ambulatory Visit (INDEPENDENT_AMBULATORY_CARE_PROVIDER_SITE_OTHER): Payer: Medicare Other

## 2021-03-18 DIAGNOSIS — G459 Transient cerebral ischemic attack, unspecified: Secondary | ICD-10-CM | POA: Diagnosis not present

## 2021-03-18 LAB — CUP PACEART REMOTE DEVICE CHECK
Date Time Interrogation Session: 20220425010111
Implantable Pulse Generator Implant Date: 20190624

## 2021-03-22 ENCOUNTER — Encounter: Payer: Self-pay | Admitting: Internal Medicine

## 2021-03-25 ENCOUNTER — Ambulatory Visit: Payer: Medicare Other | Admitting: Cardiology

## 2021-03-25 DIAGNOSIS — M25562 Pain in left knee: Secondary | ICD-10-CM | POA: Diagnosis not present

## 2021-04-03 ENCOUNTER — Telehealth: Payer: Self-pay | Admitting: *Deleted

## 2021-04-03 ENCOUNTER — Ambulatory Visit (INDEPENDENT_AMBULATORY_CARE_PROVIDER_SITE_OTHER): Payer: Medicare Other | Admitting: Internal Medicine

## 2021-04-03 ENCOUNTER — Other Ambulatory Visit: Payer: Self-pay

## 2021-04-03 ENCOUNTER — Encounter: Payer: Self-pay | Admitting: Internal Medicine

## 2021-04-03 VITALS — BP 114/62 | HR 67 | Ht 66.5 in | Wt 166.4 lb

## 2021-04-03 DIAGNOSIS — R400 Somnolence: Secondary | ICD-10-CM | POA: Diagnosis not present

## 2021-04-03 DIAGNOSIS — R001 Bradycardia, unspecified: Secondary | ICD-10-CM | POA: Diagnosis not present

## 2021-04-03 DIAGNOSIS — I480 Paroxysmal atrial fibrillation: Secondary | ICD-10-CM | POA: Diagnosis not present

## 2021-04-03 NOTE — Telephone Encounter (Signed)
-----   Message from Thora Lance, RN sent at 04/03/2021  4:50 PM EDT ----- Regarding: home sleep study Please precert and schedule.  Thank You,  Rosann Auerbach

## 2021-04-03 NOTE — Progress Notes (Deleted)
Patient Care Team: [name], MD as PCP - General (Internal Medicine) Deboraha Sprang, MD as PCP - Electrophysiology (Cardiology) Buford Dresser, MD as PCP - Cardiology (Cardiology)   HPI  Kristin Lowe is a 80 y.o. female Seen in followup for Cryptogenic Stroke  Implantable loop recorder was inserted demonstrating atrial fibrillation.  DAPT was discontinued and Eliquis was started; she is also on verapamil.  On Anticoagulation;  No  bleeding issues   The patient denies chest pain***, shortness of breath***, nocturnal dyspnea***, orthopnea*** or peripheral edema***.  There have been no palpitations***, lightheadedness*** or syncope***.    DATE TEST EF   6/19 Echo   55-65 %           Date Cr Hgb  7/19 0.81  12.9   10/20 0.7 16.1     Thromboembolic risk factors ( age  -2, HTN-1, TIA/CVA-2, DM-1, Gender-1) for a CHADSVASc Score of 6   Records and Results Reviewed   Past Medical History:  Diagnosis Date  . Bradycardia   . Hypertension   . Paroxysmal atrial fibrillation (HCC)   . Stroke (cerebrum) [facility])     Past Surgical History:  Procedure Laterality Date  . CATARACT EXTRACTION, BILATERAL    . LOOP RECORDER INSERTION N/A 05/17/2018   Procedure: LOOP RECORDER INSERTION;  Surgeon: Deboraha Sprang, MD;  Location: [place] CV LAB;  Service: Cardiovascular;  Laterality: N/A;  . LOOP RECORDER INSERTION  05/17/2018   Procedure: LOOP RECORDER INSERTION;  Surgeon: Fay Records, MD;  Location: [place];  Service: Cardiovascular;;  . TEE WITHOUT CARDIOVERSION N/A 05/17/2018   Procedure: TRANSESOPHAGEAL ECHOCARDIOGRAM (TEE);  Surgeon: Fay Records, MD;  Location: [place];  Service: Cardiovascular;  Laterality: N/A;  . TRIGGER FINGER RELEASE    . TUMOR REMOVAL      Current Meds  Medication Sig  . apixaban (ELIQUIS) 5 MG TABS tablet Take 1 tablet (5 mg total) by mouth 2 (two) times daily. Overdue for labwork, MUST have repeat labwork at office  visit prior to FUTURE refills.  [name] Kitchen atorvastatin (LIPITOR) 40 MG tablet Take 1 tablet (40 mg total) by mouth daily at 6 PM.  . azelastine (ASTELIN) 0.1 % nasal spray Place 2 sprays into the nose 2 (two) times daily.  . cephALEXin (KEFLEX) 500 MG capsule Take 1 capsule (500 mg total) by mouth 4 (four) times daily.  . Cholecalciferol (VITAMIN D) 2000 units CAPS Take 2,000 Units by mouth daily.   . clobetasol ointment (TEMOVATE) 0.05 % as needed.  [name] Kitchen co-enzyme Q-10 50 MG capsule Take 100 mg by mouth daily.  [name] Kitchen levOCARNitine (CARNITINE, L,) POWD 1 tablet by Does not apply route daily.  . Magnesium 250 MG TABS Take 250 mg by mouth daily.  . Multiple Vitamin (MULTIVITAMIN) tablet Take 1 tablet by mouth daily.  . Multiple Vitamins-Calcium (ADVANCED AM/PM PO) Take 1 tablet by mouth daily.  . Multiple Vitamins-Minerals (IMMUNE SUPPORT PO) Take 1 tablet by mouth daily.  . Omega-3 1000 MG CAPS Take 1 capsule by mouth daily.  . Triamcinolone Acetonide (NASACORT AQ NA) Place 1 spray into both nostrils daily as needed (allergies).   . TURMERIC PO Take 1 capsule by mouth daily.  . verapamil (CALAN) 40 MG tablet Take 40mg , one tablet, up to one time daily for episodes of A Fib RVR.    No Known Allergies    Review of Systems negative except from HPI and PMH  Physical Exam Particular this  lady first a few BP 114/62   Pulse 67   Ht 5' 6.5" (1.689 m)   Wt 166 lb 6.4 oz (75.5 kg)   SpO2 97%   BMI 26.46 kg/m  Well developed and well nourished in no acute distress HENT normal Neck supple with JVP-flat Clear Device pocket well healed; without hematoma or erythema.  There is no tethering  Regular rate and rhythm, no *** gallop No ***/*** murmur Abd-soft with active BS No Clubbing cyanosis *** edema Skin-warm and dry A & Oriented  Grossly normal sensory and motor function  ECG ***   Assessment and  Plan  Cryptogenic Stroke  Bradycardia  Atrial fibrillation    No intercurrent atrial  fibrillation or flutter  ,On Anticoagulation;  No bleeding issues     Current medicines are reviewed at length with the patient today .  The patient does not  have concerns regarding medicines.

## 2021-04-03 NOTE — Progress Notes (Signed)
[facility] Summary Report / Loop Recorder 

## 2021-04-03 NOTE — Patient Instructions (Signed)
Medication Instructions:  None ordered.  *If you need a refill on your cardiac medications before your next appointment, please call your pharmacy*   Lab Work: None ordered.  If you have labs (blood work) drawn today and your tests are completely normal, you will receive your results only by: [name] Kitchen MyChart Message (if you have MyChart) OR . A paper copy in the mail If you have any lab test that is abnormal or we need to change your treatment, we will call you to review the results.   Testing/Procedures: Your physician has recommended that you have a sleep study. This test records several body functions during sleep, including: brain activity, eye movement, oxygen and carbon dioxide blood levels, heart rate and rhythm, breathing rate and rhythm, the flow of air through your mouth and nose, snoring, body muscle movements, and chest and belly movement.    Follow-Up: At [facility], you and your health needs are our priority.  As part of our continuing mission to provide you with exceptional heart care, we have created designated Provider Care Teams.  These Care Teams include your primary Cardiologist (physician) and Advanced Practice Providers (APPs -  Physician Assistants and Nurse Practitioners) who all work together to provide you with the care you need, when you need it.  We recommend signing up for the patient portal called "MyChart".  Sign up information is provided on this After Visit Summary.  MyChart is used to connect with patients for Virtual Visits (Telemedicine).  Patients are able to view lab/test results, encounter notes, upcoming appointments, etc.  Non-urgent messages can be sent to your provider as well.   To learn more about what you can do with MyChart, go to [email or web address].    Your next appointment:   12 month(s)  The format for your next appointment:   In Person  Provider:   Virl Axe, MD

## 2021-04-03 NOTE — Progress Notes (Signed)
Patient Care Team: [name], MD as PCP - General (Internal Medicine) Deboraha Sprang, MD as PCP - Electrophysiology (Cardiology) Buford Dresser, MD as PCP - Cardiology (Cardiology)   HPI  Kristin Lowe is a 80 y.o. female Seen in followup for Cryptogenic Stroke  Implantable loop recorder was inserted demonstrating atrial fibrillation.  DAPT was discontinued and Eliquis was started; she is also on verapamil.  She is doing well today. She reports feeling unrested in the morning. She endorses sleep randomly while watching TV.  She lives alone and does not know if she snores   she has some concerns about the risk of surgery while taking Eliquis. She is also requesting for clearance for a potential procedure next week. She occasionally has LE edema, but she wears compression to manage her swelling. She does not salt her foods but she does drink larger amounts of fluids.    On Anticoagulation; she has no bleeding issues   The patient denies chest pain, shortness of breath, nocturnal dyspnea orthopnea or peripheral edema.  There have been no palpitations, lightheadedness or syncope.    DATE TEST EF   6/19 Echo   55-65 %           Date Cr Hgb  7/19 0.81  12.9   10/20 0.7 40.9     Thromboembolic risk factors ( age  -2, HTN-1, TIA/CVA-2, DM-1, Gender-1) for a CHADSVASc Score of 6   Records and Results Reviewed   Past Medical History:  Diagnosis Date  . Bradycardia   . Hypertension   . Paroxysmal atrial fibrillation (HCC)   . Stroke (cerebrum) [facility])     Past Surgical History:  Procedure Laterality Date  . CATARACT EXTRACTION, BILATERAL    . LOOP RECORDER INSERTION N/A 05/17/2018   Procedure: LOOP RECORDER INSERTION;  Surgeon: Deboraha Sprang, MD;  Location: [place] CV LAB;  Service: Cardiovascular;  Laterality: N/A;  . LOOP RECORDER INSERTION  05/17/2018   Procedure: LOOP RECORDER INSERTION;  Surgeon: Fay Records, MD;  Location: [place];   Service: Cardiovascular;;  . TEE WITHOUT CARDIOVERSION N/A 05/17/2018   Procedure: TRANSESOPHAGEAL ECHOCARDIOGRAM (TEE);  Surgeon: Fay Records, MD;  Location: [place];  Service: Cardiovascular;  Laterality: N/A;  . TRIGGER FINGER RELEASE    . TUMOR REMOVAL      Current Meds  Medication Sig  . apixaban (ELIQUIS) 5 MG TABS tablet Take 1 tablet (5 mg total) by mouth 2 (two) times daily. Overdue for labwork, MUST have repeat labwork at office visit prior to FUTURE refills.  [name] Kitchen atorvastatin (LIPITOR) 40 MG tablet Take 1 tablet (40 mg total) by mouth daily at 6 PM.  . azelastine (ASTELIN) 0.1 % nasal spray Place 2 sprays into the nose 2 (two) times daily.  . cephALEXin (KEFLEX) 500 MG capsule Take 1 capsule (500 mg total) by mouth 4 (four) times daily.  . Cholecalciferol (VITAMIN D) 2000 units CAPS Take 2,000 Units by mouth daily.   . clobetasol ointment (TEMOVATE) 0.05 % as needed.  [name] Kitchen co-enzyme Q-10 50 MG capsule Take 100 mg by mouth daily.  [name] Kitchen levOCARNitine (CARNITINE, L,) POWD 1 tablet by Does not apply route daily.  . Magnesium 250 MG TABS Take 250 mg by mouth daily.  . Multiple Vitamin (MULTIVITAMIN) tablet Take 1 tablet by mouth daily.  . Multiple Vitamins-Calcium (ADVANCED AM/PM PO) Take 1 tablet by mouth daily.  . Multiple Vitamins-Minerals (IMMUNE SUPPORT PO) Take 1 tablet by mouth  daily.  . Omega-3 1000 MG CAPS Take 1 capsule by mouth daily.  . Triamcinolone Acetonide (NASACORT AQ NA) Place 1 spray into both nostrils daily as needed (allergies).   . TURMERIC PO Take 1 capsule by mouth daily.  . verapamil (CALAN) 40 MG tablet Take 40mg , one tablet, up to one time daily for episodes of A Fib RVR.    No Known Allergies  Review of Systems negative except from HPI and PMH  Physical Exam Particular this lady first a few BP 114/62   Pulse 67   Ht 5' 6.5" (1.689 m)   Wt 166 lb 6.4 oz (75.5 kg)   SpO2 97%   BMI 26.46 kg/m  Well developed and well nourished in no acute  distress HENT normal Neck supple with JVP-flat Clear Device pocket well healed; without hematoma or erythema.  There is no tethering  Regular rate and rhythm, no gallop No murmur Abd-soft with active BS No Clubbing cyanosis edema Skin-warm and dry A & Oriented  Grossly normal sensory and motor function  ECsinus @ 67 14/09/40 normal  Assessment and  Plan  Cryptogenic Stroke  Bradycardia  Atrial fibrillation  Fatigue   Dental pain  Intercurrent Afib 0.9% but largely asymptomatic.  She has significant concerns about discontinuing her anticoagulation in the context of needed dental extractions (see below) as well as potential knee replacement.  We discussed the absolute risk associated with withholding of her anticoagulation for a couple of days at a time.  Her risk is probably in the range of 0.1% or so.  She is to see oral surgery next week.  She went to [facility].  They said that they could not help her.  I called them because of her pain.  They told me she did not need antibiotics.  That she should take Tylenol and NSAIDs for her pain.  I told her she could take infrequent 200 mg of ibuprofen every few days.  She has significant fatigue.  She has daytime somnolence.  I suspect she has sleep apnea.  We will arrange for home sleep study.  No bleeding on anticoagulation.       I,Alexis Bryant,acting as a scribe for Virl Axe, MD.,have documented all relevant documentation on the behalf of Virl Axe, MD,as directed by  Virl Axe, MD while in the presence of Virl Axe, MD.  I, Virl Axe, MD, have reviewed all documentation for this visit. The documentation on 04/03/21 for the exam, diagnosis, procedures, and orders are all accurate and complete.

## 2021-04-04 ENCOUNTER — Telehealth: Payer: Self-pay

## 2021-04-04 NOTE — Telephone Encounter (Signed)
Documents signed and dated.  Placed with medical records for fax and scan.

## 2021-04-04 NOTE — Telephone Encounter (Signed)
**Note De-Identified Jaquese Irving Obfuscation** The pt left her completed BMSPAF application for Eliquis at the office.  I have completed the provider page and emailed all to Dr Olin Pia nurse so she can obtain his signature, date it and to fax all to [facility] at fax number written on the cover letter included or to place in nurses box in [facility] to be fax.

## 2021-04-22 LAB — CUP PACEART REMOTE DEVICE CHECK
Date Time Interrogation Session: 20220528011050
Implantable Pulse Generator Implant Date: 20190624

## 2021-04-23 ENCOUNTER — Ambulatory Visit (INDEPENDENT_AMBULATORY_CARE_PROVIDER_SITE_OTHER): Payer: Medicare Other

## 2021-04-23 DIAGNOSIS — G459 Transient cerebral ischemic attack, unspecified: Secondary | ICD-10-CM | POA: Diagnosis not present

## 2021-05-07 ENCOUNTER — Other Ambulatory Visit: Payer: Self-pay | Admitting: Internal Medicine

## 2021-05-14 NOTE — Progress Notes (Signed)
[facility] Summary Report / Loop Recorder 

## 2021-05-20 DIAGNOSIS — S83242A Other tear of medial meniscus, current injury, left knee, initial encounter: Secondary | ICD-10-CM | POA: Diagnosis not present

## 2021-05-24 ENCOUNTER — Ambulatory Visit (INDEPENDENT_AMBULATORY_CARE_PROVIDER_SITE_OTHER): Payer: Medicare Other

## 2021-05-24 DIAGNOSIS — G459 Transient cerebral ischemic attack, unspecified: Secondary | ICD-10-CM | POA: Diagnosis not present

## 2021-05-24 LAB — CUP PACEART REMOTE DEVICE CHECK
Date Time Interrogation Session: 20220630011540
Implantable Pulse Generator Implant Date: 20190624

## 2021-05-28 DIAGNOSIS — L900 Lichen sclerosus et atrophicus: Secondary | ICD-10-CM | POA: Diagnosis not present

## 2021-05-29 ENCOUNTER — Encounter (HOSPITAL_BASED_OUTPATIENT_CLINIC_OR_DEPARTMENT_OTHER): Payer: Self-pay | Admitting: Cardiology

## 2021-05-29 ENCOUNTER — Ambulatory Visit (INDEPENDENT_AMBULATORY_CARE_PROVIDER_SITE_OTHER): Payer: Medicare Other | Admitting: Cardiology

## 2021-05-29 ENCOUNTER — Other Ambulatory Visit: Payer: Self-pay

## 2021-05-29 VITALS — BP 126/60 | HR 50 | Ht 65.0 in | Wt 167.6 lb

## 2021-05-29 DIAGNOSIS — Z79899 Other long term (current) drug therapy: Secondary | ICD-10-CM | POA: Diagnosis not present

## 2021-05-29 DIAGNOSIS — I480 Paroxysmal atrial fibrillation: Secondary | ICD-10-CM | POA: Diagnosis not present

## 2021-05-29 DIAGNOSIS — Z8673 Personal history of transient ischemic attack (TIA), and cerebral infarction without residual deficits: Secondary | ICD-10-CM | POA: Diagnosis not present

## 2021-05-29 DIAGNOSIS — I872 Venous insufficiency (chronic) (peripheral): Secondary | ICD-10-CM

## 2021-05-29 DIAGNOSIS — Z7189 Other specified counseling: Secondary | ICD-10-CM

## 2021-05-29 NOTE — Patient Instructions (Signed)
Medication Instructions:  Your physician recommends that you continue on your current medications as directed. Please refer to the Current Medication list given to you today.   *If you need a refill on your cardiac medications before your next appointment, please call your pharmacy*  Lab Work: THYROID PANEL, CMET, CBC SOON   If you have labs (blood work) drawn today and your tests are completely normal, you will receive your results only by: [place] (if you have MyChart) OR A paper copy in the mail If you have any lab test that is abnormal or we need to change your treatment, we will call you to review the results.  Testing/Procedures: NONE   Follow-Up: At [facility], you and your health needs are our priority.  As part of our continuing mission to provide you with exceptional heart care, we have created designated Provider Care Teams.  These Care Teams include your primary Cardiologist (physician) and Advanced Practice Providers (APPs -  Physician Assistants and Nurse Practitioners) who all work together to provide you with the care you need, when you need it.  We recommend signing up for the patient portal called "MyChart".  Sign up information is provided on this After Visit Summary.  MyChart is used to connect with patients for Virtual Visits (Telemedicine).  Patients are able to view lab/test results, encounter notes, upcoming appointments, etc.  Non-urgent messages can be sent to your provider as well.   To learn more about what you can do with MyChart, go to [email or web address].    Your next appointment:   12 month(s)  The format for your next appointment:   In Person  Provider:   DR Harrell Gave AT [facility]

## 2021-05-29 NOTE — Progress Notes (Signed)
Cardiology Office Note:    Date:  05/29/2021   ID:  Kristin Lowe, DOB Aug 22, 1941, MRN [id number]  PCP:  [name], MD  Cardiologist:  Buford Dresser, MD  EP: Dr. [name]  Referring MD: [name], MD   CC: follow-up  History of Present Illness:    Kristin Lowe is a 80 y.o. female with a hx of cryptogenic stroke, hypertension, sinus bradycardia, paroxysmal atrial fibrillation on anticoagulation, venous insufficiency who is seen for follow-up. I initially met her as a new consult at the request of [name], MD for the evaluation and management of PVCs. She follows with Dr. [name] and has an implantable loop recorder for evaluation of cryptogenic stroke.  Today:  Pt says she has been feeling good overall. She has been experiencing fatigue in the morning. From wearing a smartwatch she has noticed that her heart rate is in the 40's when she is sleeping. We reviewed that she has been recommended for a sleep study, and we discussed how this would be helpful.   Also, she is experiencing mild bilateral LE edema that will dissipate by the morning. Normally she does wear compression socks.   When changing position, she suffers acute lightheadedness. This occurs most often when getting up in the morning.  We reviewed her recent transmission from her loop recorder. On 03/2020 Pt experienced Afib for 29 hours. She describes it being the worse she ever had. We reviewed how afib affects the heart. She is tolerating DOAC without melena/hematochezia.  Pt is use to being active daily and claims to even walk 4-8 miles a day. She can still walk, though not as much as she used to. Climbs stairs, is active around the house. Not limited by breathing or chest pain, just joint issues. She is planned for a left total knee replacement soon.   She denies any chest pain, shortness of breath, palpitations, or exertional symptoms. No headaches, or syncope to report. Also, no orthopnea or  PND.   Past Medical History:  Diagnosis Date   Bradycardia    Hypertension    Paroxysmal atrial fibrillation (HCC)    Stroke (cerebrum) [facility])     Past Surgical History:  Procedure Laterality Date   CATARACT EXTRACTION, BILATERAL     LOOP RECORDER INSERTION N/A 05/17/2018   Procedure: LOOP RECORDER INSERTION;  Surgeon: Deboraha Sprang, MD;  Location: [place] CV LAB;  Service: Cardiovascular;  Laterality: N/A;   LOOP RECORDER INSERTION  05/17/2018   Procedure: LOOP RECORDER INSERTION;  Surgeon: Fay Records, MD;  Location: [place];  Service: Cardiovascular;;   TEE WITHOUT CARDIOVERSION N/A 05/17/2018   Procedure: TRANSESOPHAGEAL ECHOCARDIOGRAM (TEE);  Surgeon: Fay Records, MD;  Location: [facility] ENDOSCOPY;  Service: Cardiovascular;  Laterality: N/A;   TRIGGER FINGER RELEASE     TUMOR REMOVAL      Current Medications: Current Outpatient Medications on File Prior to Visit  Medication Sig   apixaban (ELIQUIS) 5 MG TABS tablet Take 1 tablet (5 mg total) by mouth 2 (two) times daily. Overdue for labwork, MUST have repeat labwork at office visit prior to FUTURE refills.   atorvastatin (LIPITOR) 40 MG tablet Take 1 tablet (40 mg total) by mouth daily at 6 PM.   azelastine (ASTELIN) 0.1 % nasal spray Place 2 sprays into the nose 2 (two) times daily.   Cholecalciferol (VITAMIN D) 2000 units CAPS Take 2,000 Units by mouth daily.    clobetasol ointment (TEMOVATE) 0.05 % as needed.   co-enzyme  Q-10 50 MG capsule Take 100 mg by mouth daily.   levOCARNitine (CARNITINE, L,) POWD 1 tablet by Does not apply route daily.   Magnesium 250 MG TABS Take 250 mg by mouth daily.   Multiple Vitamins-Calcium (ADVANCED AM/PM PO) Take 1 tablet by mouth daily.   Multiple Vitamins-Minerals (IMMUNE SUPPORT PO) Take 1 tablet by mouth daily.   Omega-3 1000 MG CAPS Take 1 capsule by mouth daily.   Triamcinolone Acetonide (NASACORT AQ NA) Place 1 spray into both nostrils daily as needed (allergies).    TURMERIC  PO Take 1 capsule by mouth daily.   verapamil (CALAN) 40 MG tablet Take 72m, one tablet, up to one time daily for episodes of A Fib RVR.   No current facility-administered medications on file prior to visit.     Allergies:   Patient has no known allergies.   Social History   Tobacco Use   Smoking status: Former    Pack years: 0.00   Smokeless tobacco: Never  Vaping Use   Vaping Use: Never used  Substance Use Topics   Alcohol use: No   Drug use: Never    Family History: family history includes Breast cancer in her mother; Diabetes in her brother and mother; Heart disease in her father; Hypertension in her brother and sister; Thyroid disease in her mother and sister.  ROS:   Please see the history of present illness. (+) fatigue (+) bilateral LE Edema (+) bilateral ankle pain (+) Lightheadedness All other systems are reviewed and negative.     EKGs/Labs/Other Studies Reviewed:    The following studies were reviewed today: Echo 05/16/18 - Left ventricle: The cavity size was normal. Systolic function was    normal. The estimated ejection fraction was in the range of 55%    to 60%. Wall motion was normal; there were no regional wall    motion abnormalities.  - Mitral valve: There was mild regurgitation.   Impressions:   - No cardiac source of emboli was indentified.  TEE 05/17/18 -no significant abnormalities, mild plaque in thoracic aorta  EKG:  EKG is personally reviewed.    05/29/2021: sinus bradycardia at 50 bpm, iRBBB 08/29/2020: sinus bradycardia at 52 bpm, iRBBB  Recent Labs: No results found for requested labs within last 8760 hours.  Recent Lipid Panel    Component Value Date/Time   CHOL 182 05/15/2018 2119   TRIG 115 05/15/2018 2119   HDL 53 05/15/2018 2119   CHOLHDL 3.4 05/15/2018 2119   VLDL 23 05/15/2018 2119   LDLCALC 106 (H) 05/15/2018 2119    Physical Exam:    VS:  BP 126/60   Pulse (!) 50   Ht '5\' 5"'  (1.651 m)   Wt 167 lb 9.6 oz (76 kg)    BMI 27.89 kg/m     Wt Readings from Last 3 Encounters:  05/29/21 167 lb 9.6 oz (76 kg)  04/03/21 166 lb 6.4 oz (75.5 kg)  08/29/20 164 lb (74.4 kg)    GEN: Well nourished, well developed in no acute distress HEENT: Normal, moist mucous membranes NECK: No JVD CARDIAC: regular rhythm, normal S1 and S2, no rubs or gallops. No murmurs. VASCULAR: Radial and DP pulses 2+ bilaterally. No carotid bruits RESPIRATORY:  Clear to auscultation without rales, wheezing or rhonchi  ABDOMEN: Soft, non-tender, non-distended MUSCULOSKELETAL:  Ambulates independently SKIN: Warm and dry, no edema today NEUROLOGIC:  Alert and oriented x 3. No focal neuro deficits noted. PSYCHIATRIC:  Normal affect    ASSESSMENT:  1. Paroxysmal atrial fibrillation ([place])   2. Medication management   3. History of CVA (cerebrovascular accident)   4. Venous insufficiency   5. Cardiac risk counseling   6. Counseling on health promotion and disease prevention     PLAN:    History of CVA Rare paroxysmal atrial fibrillation Reported prior history of PVCs (none seen today on ECG) -now on apixaban, tolerating -we reviewed her most recent ILR report, including heart rate and arrhythmia trends -has verapamil PRN, but has low resting heart rate. We discussed that the recent episode that was prolonged is the right time to use the PRN verapamil. She is very symptomatic in her afib. If she begins to have more frequent/prolonged episodes, she will reach out to Dr. [name] to discuss options. -continue atorvastatin -no aspirin as she is on apixaban -will check updated labs today to make sure there is not a trigger for her recent afib  Intermittent LE edema: none today on exam, but pattern fits with chronic venous insufficiency -counseled on compression stockings, elevation  Cardiac risk counseling and prevention recommendations: -recommend heart healthy/Mediterranean diet, with whole grains, fruits, vegetable, fish, lean  meats, nuts, and olive oil. Limit salt. -recommend moderate walking, 3-5 times/week for 30-50 minutes each session. Aim for at least 150 minutes.week. Goal should be pace of 3 miles/hours, or walking 1.5 miles in 30 minutes -recommend avoidance of tobacco products. Avoid excess alcohol.  Plan for follow up: 1 year or sooner PRN  Buford Dresser, MD, PhD [place]  [facility]    Medication Adjustments/Labs and Tests Ordered: Current medicines are reviewed at length with the patient today.  Concerns regarding medicines are outlined above.  Orders Placed This Encounter  Procedures   Thyroid Panel With TSH   CBC with Differential/Platelet   Comprehensive metabolic panel   EKG 33-ASNK    No orders of the defined types were placed in this encounter.   Patient Instructions  Medication Instructions:  Your physician recommends that you continue on your current medications as directed. Please refer to the Current Medication list given to you today.   *If you need a refill on your cardiac medications before your next appointment, please call your pharmacy*  Lab Work: THYROID PANEL, CMET, CBC SOON   If you have labs (blood work) drawn today and your tests are completely normal, you will receive your results only by: [place] (if you have MyChart) OR A paper copy in the mail If you have any lab test that is abnormal or we need to change your treatment, we will call you to review the results.  Testing/Procedures: NONE   Follow-Up: At [facility], you and your health needs are our priority.  As part of our continuing mission to provide you with exceptional heart care, we have created designated Provider Care Teams.  These Care Teams include your primary Cardiologist (physician) and Advanced Practice Providers (APPs -  Physician Assistants and Nurse Practitioners) who all work together to provide you with the care you need, when you need it.  We recommend signing up  for the patient portal called "MyChart".  Sign up information is provided on this After Visit Summary.  MyChart is used to connect with patients for Virtual Visits (Telemedicine).  Patients are able to view lab/test results, encounter notes, upcoming appointments, etc.  Non-urgent messages can be sent to your provider as well.   To learn more about what you can do with MyChart, go to [email or web address].    Your  next appointment:   12 month(s)  The format for your next appointment:   In Person  Provider:   DR Harrell Gave AT [facility],acting as a scribe for Buford Dresser, MD.,have documented all relevant documentation on the behalf of Buford Dresser, MD,as directed by  Buford Dresser, MD while in the presence of Buford Dresser, MD.  I, Buford Dresser, MD, have reviewed all documentation for this visit. The documentation on 06/02/21 for the exam, diagnosis, procedures, and orders are all accurate and complete.   Signed, Buford Dresser, MD PhD 05/29/2021     [facility]

## 2021-05-30 ENCOUNTER — Other Ambulatory Visit: Payer: Self-pay | Admitting: Cardiology

## 2021-05-30 ENCOUNTER — Encounter: Payer: Self-pay | Admitting: Cardiology

## 2021-05-30 DIAGNOSIS — I480 Paroxysmal atrial fibrillation: Secondary | ICD-10-CM | POA: Diagnosis not present

## 2021-05-30 DIAGNOSIS — Z79899 Other long term (current) drug therapy: Secondary | ICD-10-CM | POA: Diagnosis not present

## 2021-05-30 NOTE — Telephone Encounter (Signed)
error 

## 2021-05-31 LAB — THYROID PANEL WITH TSH
Free Thyroxine Index: 2.7 (ref 1.2–4.9)
T3 Uptake Ratio: 27 % (ref 24–39)
T4, Total: 10.1 ug/dL (ref 4.5–12.0)
TSH: 1.07 u[IU]/mL (ref 0.450–4.500)

## 2021-05-31 LAB — COMPREHENSIVE METABOLIC PANEL
ALT: 22 IU/L (ref 0–32)
AST: 21 IU/L (ref 0–40)
Albumin/Globulin Ratio: 1.9 (ref 1.2–2.2)
Albumin: 4.7 g/dL (ref 3.7–4.7)
Alkaline Phosphatase: 81 IU/L (ref 44–121)
BUN/Creatinine Ratio: 18 (ref 12–28)
BUN: 16 mg/dL (ref 8–27)
Bilirubin Total: 0.3 mg/dL (ref 0.0–1.2)
CO2: 26 mmol/L (ref 20–29)
Calcium: 11 mg/dL — ABNORMAL HIGH (ref 8.7–10.3)
Chloride: 105 mmol/L (ref 96–106)
Creatinine, Ser: 0.87 mg/dL (ref 0.57–1.00)
Globulin, Total: 2.5 g/dL (ref 1.5–4.5)
Glucose: 108 mg/dL — ABNORMAL HIGH (ref 65–99)
Potassium: 3.8 mmol/L (ref 3.5–5.2)
Sodium: 144 mmol/L (ref 134–144)
Total Protein: 7.2 g/dL (ref 6.0–8.5)
eGFR: 68 mL/min/{1.73_m2} (ref >59)

## 2021-05-31 LAB — CBC WITH DIFFERENTIAL/PLATELET
Basophils Absolute: 0.1 10*3/uL (ref 0.0–0.2)
Basos: 1 %
EOS (ABSOLUTE): 0.1 10*3/uL (ref 0.0–0.4)
Eos: 2 %
Hematocrit: 42.3 % (ref 34.0–46.6)
Hemoglobin: 13.5 g/dL (ref 11.1–15.9)
Immature Grans (Abs): 0 10*3/uL (ref 0.0–0.1)
Immature Granulocytes: 1 %
Lymphocytes Absolute: 1.5 10*3/uL (ref 0.7–3.1)
Lymphs: 19 %
MCH: 29 pg (ref 26.6–33.0)
MCHC: 31.9 g/dL (ref 31.5–35.7)
MCV: 91 fL (ref 79–97)
Monocytes Absolute: 0.6 10*3/uL (ref 0.1–0.9)
Monocytes: 7 %
Neutrophils Absolute: 5.9 10*3/uL (ref 1.4–7.0)
Neutrophils: 70 %
Platelets: 290 10*3/uL (ref 150–450)
RBC: 4.66 x10E6/uL (ref 3.77–5.28)
RDW: 12.8 % (ref 11.7–15.4)
WBC: 8.2 10*3/uL (ref 3.4–10.8)

## 2021-06-05 ENCOUNTER — Telehealth: Payer: Self-pay | Admitting: *Deleted

## 2021-06-05 NOTE — Telephone Encounter (Signed)
-----   Message from Kristin Hansen, LPN sent at 01/29/9431  2:15 PM EDT ----- Hello all   Patient was in seeing Dr Harrell Gave and was just following up on sleep study Dr [name] ordered. Told her I would reach out  Thanks  [place]

## 2021-06-05 NOTE — Telephone Encounter (Signed)
Telephoned patient to give her the HST appointment. She states that she could not talk right now. I just told her to check her MyChart account to review her sleep study appointment. She said okay. She would check it once she gets home and thanked me for calling.

## 2021-06-11 DIAGNOSIS — L821 Other seborrheic keratosis: Secondary | ICD-10-CM | POA: Diagnosis not present

## 2021-06-11 DIAGNOSIS — L570 Actinic keratosis: Secondary | ICD-10-CM | POA: Diagnosis not present

## 2021-06-11 DIAGNOSIS — D225 Melanocytic nevi of trunk: Secondary | ICD-10-CM | POA: Diagnosis not present

## 2021-06-12 NOTE — Progress Notes (Signed)
[facility] Summary Report / Loop Recorder 

## 2021-06-25 LAB — CUP PACEART REMOTE DEVICE CHECK
Date Time Interrogation Session: 20220802011950
Implantable Pulse Generator Implant Date: 20190624

## 2021-06-26 ENCOUNTER — Ambulatory Visit (INDEPENDENT_AMBULATORY_CARE_PROVIDER_SITE_OTHER): Payer: Medicare Other

## 2021-06-26 DIAGNOSIS — I480 Paroxysmal atrial fibrillation: Secondary | ICD-10-CM

## 2021-06-28 ENCOUNTER — Encounter (HOSPITAL_BASED_OUTPATIENT_CLINIC_OR_DEPARTMENT_OTHER): Payer: Medicare Other | Admitting: Cardiovascular Disease

## 2021-07-09 ENCOUNTER — Ambulatory Visit (HOSPITAL_BASED_OUTPATIENT_CLINIC_OR_DEPARTMENT_OTHER): Payer: Medicare Other | Attending: Internal Medicine | Admitting: Cardiovascular Disease

## 2021-07-09 ENCOUNTER — Other Ambulatory Visit: Payer: Self-pay | Admitting: *Deleted

## 2021-07-09 ENCOUNTER — Other Ambulatory Visit: Payer: Self-pay

## 2021-07-09 DIAGNOSIS — Z7901 Long term (current) use of anticoagulants: Secondary | ICD-10-CM | POA: Insufficient documentation

## 2021-07-09 DIAGNOSIS — Z79899 Other long term (current) drug therapy: Secondary | ICD-10-CM | POA: Diagnosis not present

## 2021-07-09 DIAGNOSIS — R400 Somnolence: Secondary | ICD-10-CM | POA: Diagnosis not present

## 2021-07-09 DIAGNOSIS — I480 Paroxysmal atrial fibrillation: Secondary | ICD-10-CM | POA: Diagnosis not present

## 2021-07-09 DIAGNOSIS — G4733 Obstructive sleep apnea (adult) (pediatric): Secondary | ICD-10-CM | POA: Diagnosis not present

## 2021-07-09 MED ORDER — VERAPAMIL HCL 40 MG PO TABS: ORAL_TABLET | ORAL | 1 refills | Status: DC

## 2021-07-11 ENCOUNTER — Telehealth: Payer: Self-pay | Admitting: Internal Medicine

## 2021-07-11 NOTE — Telephone Encounter (Signed)
Spoke with pt who complains of increasing Afib episodes since 07/07/2021 with fatigue.  Pt states she is currently in Afib and taking her medications as prescribed.  Pt denies current CP, SOB or dizziness.  She is unable to send a loop remote transmission due to hand held device needing replacing.  She has spoken to someone with [employer] and they are sending her a new one. Reviewed ED precautions and appointment scheduled with Oda Kilts, PA-C for 07/12/2021 at 1120am.  Pt verbalizes understanding and agrees with current plan.

## 2021-07-11 NOTE — Telephone Encounter (Signed)
Patient c/o Palpitations:  High priority if patient c/o lightheadedness, shortness of breath, or chest pain  How long have you had palpitations/irregular HR/ Afib? Are you having the symptoms now?  Patient states she's been in and out of afib since 07/07/21 States she is currently in afib. . Are you currently experiencing lightheadedness, SOB or CP?  Yes   Do you have a history of afib (atrial fibrillation) or irregular heart rhythm?  Yes   Have you checked your BP or HR? (document readings if available):  07/11/21: BP -100/71 HR -90             -122/75        -95            -112/69       -83          -110    Are you experiencing any other symptoms?  No

## 2021-07-11 NOTE — Telephone Encounter (Signed)
Spoke with patient and she is having issues with her monitor, tech support was conference in and they detect she needs a new handheld. They are sending her a new one 4-5 days. Patient will call when she gets it, patient would like a call back as to what to do next

## 2021-07-12 ENCOUNTER — Encounter: Payer: Self-pay | Admitting: Student

## 2021-07-12 ENCOUNTER — Ambulatory Visit (INDEPENDENT_AMBULATORY_CARE_PROVIDER_SITE_OTHER): Payer: Medicare Other | Admitting: Student

## 2021-07-12 ENCOUNTER — Other Ambulatory Visit: Payer: Self-pay

## 2021-07-12 VITALS — BP 134/80 | HR 78 | Ht 66.0 in | Wt 167.4 lb

## 2021-07-12 DIAGNOSIS — I10 Essential (primary) hypertension: Secondary | ICD-10-CM | POA: Diagnosis not present

## 2021-07-12 DIAGNOSIS — R001 Bradycardia, unspecified: Secondary | ICD-10-CM

## 2021-07-12 DIAGNOSIS — I480 Paroxysmal atrial fibrillation: Secondary | ICD-10-CM | POA: Diagnosis not present

## 2021-07-12 DIAGNOSIS — Z8673 Personal history of transient ischemic attack (TIA), and cerebral infarction without residual deficits: Secondary | ICD-10-CM

## 2021-07-12 MED ORDER — VERAPAMIL HCL 40 MG PO TABS: 40.0000 mg | ORAL_TABLET | Freq: Every day | ORAL | 3 refills | Status: DC

## 2021-07-12 NOTE — Progress Notes (Signed)
PCP:  [name], MD Primary Cardiologist: Buford Dresser, MD Electrophysiologist: Virl Axe, MD   Kristin Lowe is a 80 y.o. female seen today for Virl Axe, MD for acute visit due to increasing AF .  Since last being seen in our clinic the patient reports more fatigue and AF by her apple watch.  She has noticed this especially over the past 5-7 days. She recently travelled and visited family. She has had sinus drainage / bronchitis type symptoms for the past several days as well. No fevers or chills. She has not tested for COVID. She denies any missed doses of her Eliquis.  She has been taking her verapamil over the past few days with minimal relief.   Past Medical History:  Diagnosis Date   Bradycardia    Hypertension    Paroxysmal atrial fibrillation (HCC)    Stroke (cerebrum) [facility])    Past Surgical History:  Procedure Laterality Date   CATARACT EXTRACTION, BILATERAL     LOOP RECORDER INSERTION N/A 05/17/2018   Procedure: LOOP RECORDER INSERTION;  Surgeon: Deboraha Sprang, MD;  Location: [place] CV LAB;  Service: Cardiovascular;  Laterality: N/A;   LOOP RECORDER INSERTION  05/17/2018   Procedure: LOOP RECORDER INSERTION;  Surgeon: Fay Records, MD;  Location: [place];  Service: Cardiovascular;;   TEE WITHOUT CARDIOVERSION N/A 05/17/2018   Procedure: TRANSESOPHAGEAL ECHOCARDIOGRAM (TEE);  Surgeon: Fay Records, MD;  Location: [facility] ENDOSCOPY;  Service: Cardiovascular;  Laterality: N/A;   TRIGGER FINGER RELEASE     TUMOR REMOVAL      Current Outpatient Medications  Medication Sig Dispense Refill   apixaban (ELIQUIS) 5 MG TABS tablet Take 1 tablet (5 mg total) by mouth 2 (two) times daily. Overdue for labwork, MUST have repeat labwork at office visit prior to FUTURE refills. 180 tablet 0   atorvastatin (LIPITOR) 40 MG tablet Take 1 tablet (40 mg total) by mouth daily at 6 PM. 30 tablet 3   azelastine (ASTELIN) 0.1 % nasal spray Place 2 sprays into the nose 2  (two) times daily.     Cholecalciferol (VITAMIN D) 2000 units CAPS Take 2,000 Units by mouth daily.      clobetasol ointment (TEMOVATE) 0.05 % as needed.     co-enzyme Q-10 50 MG capsule Take 100 mg by mouth daily.     levOCARNitine (CARNITINE, L,) POWD 1 tablet by Does not apply route daily.     Magnesium 250 MG TABS Take 250 mg by mouth daily.     Multiple Vitamins-Calcium (ADVANCED AM/PM PO) Take 1 tablet by mouth daily.     Multiple Vitamins-Minerals (IMMUNE SUPPORT PO) Take 1 tablet by mouth daily.     Omega-3 1000 MG CAPS Take 1 capsule by mouth daily.     Triamcinolone Acetonide (NASACORT AQ NA) Place 1 spray into both nostrils daily as needed (allergies).      TURMERIC PO Take 1 capsule by mouth daily.     verapamil (CALAN) 40 MG tablet Take '40mg'$ , one tablet, up to one time daily for episodes of A Fib RVR. 15 tablet 1   No current facility-administered medications for this visit.    No Known Allergies  Social History   Socioeconomic History   Marital status: Single    Spouse name: Not on file   Number of children: Not on file   Years of education: Not on file   Highest education level: Not on file  Occupational History   Not on file  Tobacco Use   Smoking status: Former   Smokeless tobacco: Never  Vaping Use   Vaping Use: Never used  Substance and Sexual Activity   Alcohol use: No   Drug use: Never   Sexual activity: Not on file  Other Topics Concern   Not on file  Social History Narrative   Not on file   Social Determinants of Health   Financial Resource Strain: Not on file  Food Insecurity: Not on file  Transportation Needs: Not on file  Physical Activity: Not on file  Stress: Not on file  Social Connections: Not on file  Intimate Partner Violence: Not on file    Review of Systems: All other systems reviewed and are otherwise negative except as noted above.  Physical Exam: Vitals:   07/12/21 1118  BP: 134/80  Pulse: 78  SpO2: 98%  Weight: 167 lb  6.4 oz (75.9 kg)  Height: '5\' 6"'$  (1.676 m)    GEN- The patient is well appearing, alert and oriented x 3 today.   HEENT: normocephalic, atraumatic; sclera clear, conjunctiva pink; hearing intact; oropharynx clear; neck supple, no JVP Lymph- no cervical lymphadenopathy Lungs- Clear to ausculation bilaterally, normal work of breathing.  No wheezes, rales, rhonchi Heart- Irregularly irregular rate and rhythm, no murmurs, rubs or gallops, PMI not laterally displaced GI- soft, non-tender, non-distended, bowel sounds present, no hepatosplenomegaly Extremities- no clubbing, cyanosis, or edema; DP/PT/radial pulses 2+ bilaterally MS- no significant deformity or atrophy Skin- warm and dry, no rash or lesion Psych- euthymic mood, full affect Neuro- strength and sensation are intact  EKG is not ordered. Personal review of EKG from  05/29/21  shows sinus bradycardia at 50 bpm  Additional studies reviewed include: Previous EP office notes.   Assessment and Plan:  Cryptogenic Stroke   Bradycardia   Atrial fibrillation   Fatigue   AF burden increasing as of late. ? Bronchitis type illness currently which may be contributing.   By loop recorder current episode ongoing since 8/17. Will tentatively plan for [facility] next week. Will have her check on her apple watch prior to this. If she reverts to sinus would cancel.   She does not want to consider AAD as she prefers overall not to be on medications. She has been taking verapamil over the past few days.   She would like to be considered for ablation, and ultimately, Watchman device.   With her symptoms, will plan for cardioversion next week.  Could consider flecainide if she is willing. Echo to be updated as well.  Labs today.  Shirley Friar, PA-C  07/12/21 11:41 AM

## 2021-07-12 NOTE — Patient Instructions (Addendum)
Medication Instructions:  Your physician has recommended you make the following change in your medication:   TAKE VERAPAMIL '40MG'$  DAILY  *If you need a refill on your cardiac medications before your next appointment, please call your pharmacy*  Testing/Procedure: Your physician has requested that you have an echocardiogram. Echocardiography is a painless test that uses sound waves to create images of your heart. It provides your doctor with information about the size and shape of your heart and how well your heart's chambers and valves are working. This procedure takes approximately one hour. There are no restrictions for this procedure.  Follow-Up: At [facility], you and your health needs are our priority.  As part of our continuing mission to provide you with exceptional heart care, we have created designated Provider Care Teams.  These Care Teams include your primary Cardiologist (physician) and Advanced Practice Providers (APPs -  Physician Assistants and Nurse Practitioners) who all work together to provide you with the care you need, when you need it.   Your next appointment:   07/30/2021 with Oda Kilts, PA-C   You are scheduled for a Cardioversion on 07/17/2021 with Dr. Harrell Gave.  Please arrive at the [facility] (Main Entrance A) at [facility]: [address] [place], [place] [address] at 8:30 am.  DIET: Nothing to eat or drink after midnight except a sip of water with medications (see medication instructions below)  FYI: For your safety, and to allow [place] to monitor your vital signs accurately during the surgery/procedure we request that   if you have artificial nails, gel coating, SNS etc. Please have those removed prior to your surgery/procedure. Not having the nail coverings /polish removed may result in cancellation or delay of your surgery/procedure.   Medication Instructions:  Continue your anticoagulant: Eliquis You will need to continue your anticoagulant  after your procedure until you  are told by your  Provider that it is safe to stop   Labs: CMET, CBC, TSH  You must have a responsible person to drive you home and stay in the waiting area during your procedure. Failure to do so could result in cancellation.  Bring your insurance cards.  *Special Note: Every effort is made to have your procedure done on time. Occasionally there are emergencies that occur at the hospital that may cause delays. Please be patient if a delay does occur.

## 2021-07-13 LAB — COMPREHENSIVE METABOLIC PANEL
ALT: 19 IU/L (ref 0–32)
AST: 19 IU/L (ref 0–40)
Albumin/Globulin Ratio: 2 (ref 1.2–2.2)
Albumin: 4.5 g/dL (ref 3.7–4.7)
Alkaline Phosphatase: 76 IU/L (ref 44–121)
BUN/Creatinine Ratio: 17 (ref 12–28)
BUN: 14 mg/dL (ref 8–27)
Bilirubin Total: 0.5 mg/dL (ref 0.0–1.2)
CO2: 23 mmol/L (ref 20–29)
Calcium: 10.5 mg/dL — ABNORMAL HIGH (ref 8.7–10.3)
Chloride: 104 mmol/L (ref 96–106)
Creatinine, Ser: 0.81 mg/dL (ref 0.57–1.00)
Globulin, Total: 2.2 g/dL (ref 1.5–4.5)
Glucose: 84 mg/dL (ref 65–99)
Potassium: 4.4 mmol/L (ref 3.5–5.2)
Sodium: 141 mmol/L (ref 134–144)
Total Protein: 6.7 g/dL (ref 6.0–8.5)
eGFR: 74 mL/min/{1.73_m2} (ref >59)

## 2021-07-13 LAB — CBC
Hematocrit: 41.2 % (ref 34.0–46.6)
Hemoglobin: 13.4 g/dL (ref 11.1–15.9)
MCH: 29.5 pg (ref 26.6–33.0)
MCHC: 32.5 g/dL (ref 31.5–35.7)
MCV: 91 fL (ref 79–97)
Platelets: 278 10*3/uL (ref 150–450)
RBC: 4.54 x10E6/uL (ref 3.77–5.28)
RDW: 12.3 % (ref 11.7–15.4)
WBC: 7.5 10*3/uL (ref 3.4–10.8)

## 2021-07-13 LAB — TSH: TSH: 0.564 u[IU]/mL (ref 0.450–4.500)

## 2021-07-14 ENCOUNTER — Encounter (HOSPITAL_BASED_OUTPATIENT_CLINIC_OR_DEPARTMENT_OTHER): Payer: Self-pay | Admitting: Cardiovascular Disease

## 2021-07-14 NOTE — Procedures (Signed)
      Patient Name: Kristin Lowe, Kristin Lowe Date: 07/09/2021 Gender: Female D.O.B: 12/07/1940 Age (years): 80 Referring Provider: Virl Axe Height (inches): 48 Interpreting Physician: Shelva Majestic MD, ABSM Weight (lbs): 165 RPSGT: Jacolyn Reedy BMI: 27 MRN: [id number] Neck Size: <br>  CLINICAL INFORMATION Sleep Study Type: HST  Indication for sleep study: snoring, daytime sleepiness, fatigue, PAF  Epworth Sleepiness Score: 16  SLEEP STUDY TECHNIQUE A multi-channel overnight portable sleep study was performed. The channels recorded were: nasal airflow, thoracic respiratory movement, and oxygen saturation with a pulse oximetry. Snoring was also monitored.  MEDICATIONS apixaban (ELIQUIS) 5 MG TABS tablet atorvastatin (LIPITOR) 40 MG tablet azelastine (ASTELIN) 0.1 % nasal spray Cholecalciferol (VITAMIN D) 2000 units CAPS clobetasol ointment (TEMOVATE) 0.05 % co-enzyme Q-10 50 MG capsule levOCARNitine (CARNITINE, L,) POWD Magnesium 250 MG TABS Multiple Vitamins-Calcium (ADVANCED AM/PM PO) Multiple Vitamins-Minerals (IMMUNE SUPPORT PO) Omega-3 1000 MG CAPS Triamcinolone Acetonide (NASACORT AQ NA) TURMERIC PO verapamil (CALAN) 40 MG tablet  Patient self administered medications include: N/A.  SLEEP ARCHITECTURE Patient was studied for 510.5 minutes. The sleep efficiency was 100.0 % and the patient was supine for 50.3%. The arousal index was 0.0 per hour.  RESPIRATORY PARAMETERS The overall AHI was 20.3 per hour, with a central apnea index of 0 per hour.  The oxygen nadir was 90% during sleep  CARDIAC DATA Mean heart rate during sleep was 74.8 bpm.  IMPRESSIONS - Moderate obstructive sleep apnea occurred during this study (AHI 20.3/h). There is a significant positional component with supine sleep AHI 35.7/h versus non-supine sleep AHI 4.7/h. - The patient had minimal or no oxygen desaturation during the study (Min O2 = 90%) - Patient snored 3.4% during the  sleep.  DIAGNOSIS - Obstructive Sleep Apnea (G47.33)  RECOMMENDATIONS - In this symptomatic patient with cardiovascular co-morbidities recommend a therapeutic CPAP titration evaluation.  Auto-PAP with EPR of 3 at 6- 16 cm of water can be initiated. - Effort should be made to optimize nasal and oropharyngeal patency.  - With severe sleep apnea with supine sleep (AHI 35.7/h) recommend positional therapy avoiding supine position during sleep. - Avoid alcohol, sedatives and other CNS depressants that may worsen sleep apnea and disrupt normal sleep architecture. - Sleep hygiene should be reviewed to assess factors that may improve sleep quality. - Weight management and regular exercise should be initiated or continued. - Recommend a download and sleep clinic evaluation after one month of therapy.  - Patient may benefit from in-lab study  [Electronically signed] 07/14/2021 05:21 PM  Shelva Majestic MD, [facility], ABSM Diplomate, American Board of Sleep Medicine   NPI: [id number] [place] PH: [phone]   FX: [phone] [place]

## 2021-07-15 ENCOUNTER — Telehealth: Payer: Self-pay | Admitting: Internal Medicine

## 2021-07-15 NOTE — Telephone Encounter (Signed)
Spoke with pt and advised per Oda Kilts, PA-C pt may continue Verapamil due to being low dose or may switch to low dose beta blocker at bedtime.  Pt states she will continue Verapamil for now and will plan to keep appointment with Oda Kilts PA-C as scheduled on 07/30/2021.  Continue to monitor HR.  Reviewed ED precautions.  Pt verbalizes understanding and agrees with current plan.

## 2021-07-15 NOTE — Telephone Encounter (Signed)
Spoke with pt who states she feels as if she is back in NSR.  Current HR is 60.  Pt states he Apple watch alerted her last night 3 times that her HR was low.  She was unable to retrieve the readings.  Pt reports she has been taking Verapamil '40mg'$  daily for Afib as directed last week by PA-C.  She would like to cancel her DCCV as she feels much better and fatigue has resolved. Pt advised to send transmission to be reviewed before we cancel DCCV.  Will also forward message to Oda Kilts, PA-C to advise on Verapamil.  Pt verbalizes understanding and agrees with current plan.

## 2021-07-15 NOTE — Telephone Encounter (Signed)
Transmission reviewed and patient is back in SR. Called scheduling and spoke to [place], [place] canceled. Patient updated. Routing to A. [place].

## 2021-07-15 NOTE — Telephone Encounter (Signed)
Pt c/o medication issue:  1. Name of Medication:  verapamil (CALAN) 40 MG tablet  2. How are you currently taking this medication (dosage and times per day)?  As prescribed, 1 tablet daily  3. Are you having a reaction (difficulty breathing--STAT)?  No   4. What is your medication issue?   Patient would like to know if she needs to continue taking this medication. She states last night her Apple Watch notified her of a low HR 3 times, but she is unable to retrieve the readings right now. She assumes the medication caused this. She also states her heart is back in rhythm and she is considering cancelling 07/17/21 cardioversion with Dr. Harrell Gave. Please return call to discuss.

## 2021-07-17 ENCOUNTER — Ambulatory Visit (HOSPITAL_COMMUNITY): Admission: RE | Admit: 2021-07-17 | Payer: Medicare Other | Source: Home / Self Care | Admitting: Cardiology

## 2021-07-17 ENCOUNTER — Encounter (HOSPITAL_COMMUNITY): Admission: RE | Payer: Self-pay | Source: Home / Self Care

## 2021-07-17 SURGERY — CARDIOVERSION
Anesthesia: General

## 2021-07-22 ENCOUNTER — Other Ambulatory Visit: Payer: Self-pay | Admitting: Cardiovascular Disease

## 2021-07-22 ENCOUNTER — Telehealth: Payer: Self-pay | Admitting: *Deleted

## 2021-07-22 DIAGNOSIS — G4733 Obstructive sleep apnea (adult) (pediatric): Secondary | ICD-10-CM

## 2021-07-22 NOTE — Telephone Encounter (Signed)
Patient notified of HST results and recommendations. She agrees to proceed with having CPAP titration sleep study.

## 2021-07-22 NOTE — Progress Notes (Signed)
[facility] Summary Report / Loop Recorder 

## 2021-07-22 NOTE — Telephone Encounter (Signed)
-----   Message from Troy Sine, MD sent at 07/14/2021  5:27 PM EDT ----- Mariann Laster, please notify pt and try for CPAP titration; otherwise, Auto-PAP

## 2021-07-28 NOTE — Progress Notes (Signed)
PCP:  [name], MD Primary Cardiologist: Buford Dresser, MD Electrophysiologist: Virl Axe, MD   Kristin Lowe is a 80 y.o. female seen today for Virl Axe, MD for routine EP follow up.  At last visit, planned for [facility], but she went back into NSR with verapamil alone.   She has been feeling OK. She has had no further AF in approximately 2 weeks. She has occasionally been woken up by her watch for a "low HR" warning.  She believes it is set to alert for HRs <44. She denies any lightheadedness or dizziness during the day. No syncope or near syncope. She has not been very active, in fear that it will "set off her AF". Her sinus drainage / bronchitis symptoms have resolved.   Past Medical History:  Diagnosis Date   Bradycardia    Hypertension    Paroxysmal atrial fibrillation (HCC)    Stroke (cerebrum) [facility])    Past Surgical History:  Procedure Laterality Date   CATARACT EXTRACTION, BILATERAL     LOOP RECORDER INSERTION N/A 05/17/2018   Procedure: LOOP RECORDER INSERTION;  Surgeon: Deboraha Sprang, MD;  Location: [place] [facility];  Service: Cardiovascular;  Laterality: N/A;   LOOP RECORDER INSERTION  05/17/2018   Procedure: LOOP RECORDER INSERTION;  Surgeon: Fay Records, MD;  Location: [place];  Service: Cardiovascular;;   TEE WITHOUT CARDIOVERSION N/A 05/17/2018   Procedure: TRANSESOPHAGEAL ECHOCARDIOGRAM (TEE);  Surgeon: Fay Records, MD;  Location: [facility] ENDOSCOPY;  Service: Cardiovascular;  Laterality: N/A;   TRIGGER FINGER RELEASE     TUMOR REMOVAL      Current Outpatient Medications  Medication Sig Dispense Refill   apixaban (ELIQUIS) 5 MG TABS tablet Take 1 tablet (5 mg total) by mouth 2 (two) times daily. Overdue for labwork, MUST have repeat labwork at office visit prior to FUTURE refills. 180 tablet 0   atorvastatin (LIPITOR) 40 MG tablet Take 1 tablet (40 mg total) by mouth daily at 6 PM. 30 tablet 3   azelastine (ASTELIN) 0.1 % nasal spray Place 2  sprays into the nose 2 (two) times daily.     Cholecalciferol (VITAMIN D) 2000 units CAPS Take 2,000 Units by mouth daily.      clobetasol ointment (TEMOVATE) 0.05 % as needed.     co-enzyme Q-10 50 MG capsule Take 100 mg by mouth daily.     levOCARNitine (CARNITINE, L,) POWD 1 tablet by Does not apply route daily.     Magnesium 250 MG TABS Take 250 mg by mouth daily.     Multiple Vitamins-Calcium (ADVANCED AM/PM PO) Take 1 tablet by mouth daily.     Multiple Vitamins-Minerals (IMMUNE SUPPORT PO) Take 1 tablet by mouth daily.     Omega-3 1000 MG CAPS Take 1 capsule by mouth daily.     Triamcinolone Acetonide (NASACORT AQ NA) Place 1 spray into both nostrils daily as needed (allergies).      TURMERIC PO Take 1 capsule by mouth daily.     verapamil (CALAN) 40 MG tablet Take 1 tablet (40 mg total) by mouth daily. 90 tablet 3   No current facility-administered medications for this visit.    No Known Allergies  Social History   Socioeconomic History   Marital status: Single    Spouse name: Not on file   Number of children: Not on file   Years of education: Not on file   Highest education level: Not on file  Occupational History   Not on  file  Tobacco Use   Smoking status: Former   Smokeless tobacco: Never  Vaping Use   Vaping Use: Never used  Substance and Sexual Activity   Alcohol use: No   Drug use: Never   Sexual activity: Not on file  Other Topics Concern   Not on file  Social History Narrative   Not on file   Social Determinants of Health   Financial Resource Strain: Not on file  Food Insecurity: Not on file  Transportation Needs: Not on file  Physical Activity: Not on file  Stress: Not on file  Social Connections: Not on file  Intimate Partner Violence: Not on file    Review of Systems: All other systems reviewed and are otherwise negative except as noted above.  Physical Exam: Vitals:   07/30/21 1159  BP: 140/68  Pulse: (!) 52  SpO2: 98%  Weight: 167 lb  (75.8 kg)  Height: 5' 6.5" (1.689 m)     GEN- The patient is well appearing, alert and oriented x 3 today.   HEENT: normocephalic, atraumatic; sclera clear, conjunctiva pink; hearing intact; oropharynx clear; neck supple, no JVP Lymph- no cervical lymphadenopathy Lungs- Clear to ausculation bilaterally, normal work of breathing.  No wheezes, rales, rhonchi Heart- Irregularly irregular rate and rhythm, no murmurs, rubs or gallops, PMI not laterally displaced GI- soft, non-tender, non-distended, bowel sounds present, no hepatosplenomegaly Extremities- no clubbing, cyanosis, or edema; DP/PT/radial pulses 2+ bilaterally MS- no significant deformity or atrophy Skin- warm and dry, no rash or lesion Psych- euthymic mood, full affect Neuro- strength and sensation are intact  EKG is not ordered. Personal review of EKG from  05/29/21  shows sinus bradycardia at 50 bpm  Additional studies reviewed include: Previous EP office notes.   Assessment and Plan:  Cryptogenic Stroke   Bradycardia   Atrial fibrillation   Fatigue   AF burden had increased, but no further x 2 weeks on daily verapamil.   By loop recorder current in NSR. She had 1 "pause" episode that is undersensing. Pt has clear R waves in 80s. She occasionally has HRs in the 30-40s by her loop recorder, but with undersensing, not sure how reliable this is.  Not formally interrogated today. Carelink report reviewed from this am.   She does not want to consider AAD as she prefers overall not to be on medications.   She would like to be considered for ablation, and ultimately, Watchman device.   She has close follow up for consideration of ablation.  Body mass index is 26.55 kg/m.  We are pending Echo to help further assess her status. She understands that any escalation of her therapies may lead to symptomatic bradycardia prompting pacing.    Shirley Friar, PA-C  07/30/21 12:07 PM

## 2021-07-30 ENCOUNTER — Ambulatory Visit (INDEPENDENT_AMBULATORY_CARE_PROVIDER_SITE_OTHER): Payer: Medicare Other | Admitting: Student

## 2021-07-30 ENCOUNTER — Encounter: Payer: Self-pay | Admitting: Student

## 2021-07-30 ENCOUNTER — Ambulatory Visit (INDEPENDENT_AMBULATORY_CARE_PROVIDER_SITE_OTHER): Payer: Medicare Other

## 2021-07-30 ENCOUNTER — Other Ambulatory Visit: Payer: Self-pay

## 2021-07-30 VITALS — BP 140/68 | HR 52 | Ht 66.5 in | Wt 167.0 lb

## 2021-07-30 DIAGNOSIS — I480 Paroxysmal atrial fibrillation: Secondary | ICD-10-CM

## 2021-07-30 DIAGNOSIS — Z8673 Personal history of transient ischemic attack (TIA), and cerebral infarction without residual deficits: Secondary | ICD-10-CM

## 2021-07-30 DIAGNOSIS — I10 Essential (primary) hypertension: Secondary | ICD-10-CM | POA: Diagnosis not present

## 2021-07-30 DIAGNOSIS — Z79899 Other long term (current) drug therapy: Secondary | ICD-10-CM | POA: Diagnosis not present

## 2021-07-30 DIAGNOSIS — I872 Venous insufficiency (chronic) (peripheral): Secondary | ICD-10-CM | POA: Diagnosis not present

## 2021-07-30 LAB — CUP PACEART REMOTE DEVICE CHECK
Date Time Interrogation Session: 20220904012202
Implantable Pulse Generator Implant Date: 20190624

## 2021-08-02 ENCOUNTER — Ambulatory Visit (HOSPITAL_COMMUNITY): Payer: Medicare Other | Attending: Internal Medicine

## 2021-08-02 ENCOUNTER — Other Ambulatory Visit: Payer: Self-pay

## 2021-08-02 DIAGNOSIS — I480 Paroxysmal atrial fibrillation: Secondary | ICD-10-CM | POA: Insufficient documentation

## 2021-08-02 LAB — ECHOCARDIOGRAM COMPLETE
Area-P 1/2: 4.06 cm2
S' Lateral: 3 cm

## 2021-08-07 NOTE — Progress Notes (Signed)
[facility] Summary Report / Loop Recorder 

## 2021-08-13 ENCOUNTER — Other Ambulatory Visit: Payer: Self-pay

## 2021-08-13 ENCOUNTER — Ambulatory Visit (INDEPENDENT_AMBULATORY_CARE_PROVIDER_SITE_OTHER): Payer: Medicare Other | Admitting: Cardiology

## 2021-08-13 VITALS — BP 142/78 | HR 72 | Ht 66.5 in | Wt 167.8 lb

## 2021-08-13 DIAGNOSIS — G459 Transient cerebral ischemic attack, unspecified: Secondary | ICD-10-CM | POA: Diagnosis not present

## 2021-08-13 DIAGNOSIS — I10 Essential (primary) hypertension: Secondary | ICD-10-CM

## 2021-08-13 DIAGNOSIS — Z8673 Personal history of transient ischemic attack (TIA), and cerebral infarction without residual deficits: Secondary | ICD-10-CM

## 2021-08-13 DIAGNOSIS — R001 Bradycardia, unspecified: Secondary | ICD-10-CM

## 2021-08-13 DIAGNOSIS — I480 Paroxysmal atrial fibrillation: Secondary | ICD-10-CM

## 2021-08-13 NOTE — Patient Instructions (Addendum)
Medication Instructions:  Your physician recommends that you continue on your current medications as directed. Please refer to the Current Medication list given to you today. *If you need a refill on your cardiac medications before your next appointment, please call your pharmacy*  Lab Work: None ordered. If you have labs (blood work) drawn today and your tests are completely normal, you will receive your results only by: [place] (if you have MyChart) OR A paper copy in the mail If you have any lab test that is abnormal or we need to change your treatment, we will call you to review the results.  Testing/Procedures: Your physician has recommended that you have an ablation. Catheter ablation is a medical procedure used to treat some cardiac arrhythmias (irregular heartbeats). During catheter ablation, a long, thin, flexible tube is put into a blood vessel in your groin (upper thigh), or neck. This tube is called an ablation catheter. It is then guided to your heart through the blood vessel. Radio frequency waves destroy small areas of heart tissue where abnormal heartbeats may cause an arrhythmia to start. Please see the instruction sheet given to you today.   Follow-Up: At [facility], you and your health needs are our priority.  As part of our continuing mission to provide you with exceptional heart care, we have created designated Provider Care Teams.  These Care Teams include your primary Cardiologist (physician) and Advanced Practice Providers (APPs -  Physician Assistants and Nurse Practitioners) who all work together to provide you with the care you need, when you need it.  Your next appointment:    SEE INSTRUCTION LETTER  Cardiac Ablation Cardiac ablation is a procedure to destroy, or ablate, a small amount of heart tissue in very specific places. The heart has many electrical connections. Sometimes these connections are abnormal and can cause the heart to beat very fast or  irregularly. Ablating some of the areas that cause problems can improve the heart's rhythm or return it to normal. Ablation may be done for people who: Have Wolff-Parkinson-White syndrome. Have fast heart rhythms (tachycardia). Have taken medicines for an abnormal heart rhythm (arrhythmia) that were not effective or caused side effects. Have a high-risk heartbeat that may be life-threatening. During the procedure, a small incision is made in the neck or the groin, and a long, thin tube (catheter) is inserted into the incision and moved to the heart. Small devices (electrodes) on the tip of the catheter will send out electrical currents. A type of X-ray (fluoroscopy) will be used to help guide the catheter and to provide images of the heart. Tell a health care provider about: Any allergies you have. All medicines you are taking, including vitamins, herbs, eye drops, creams, and over-the-counter medicines. Any problems you or family members have had with anesthetic medicines. Any blood disorders you have. Any surgeries you have had. Any medical conditions you have, such as kidney failure. Whether you are pregnant or may be pregnant. What are the risks? Generally, this is a safe procedure. However, problems may occur, including: Infection. Bruising and bleeding at the catheter insertion site. Bleeding into the chest, especially into the sac that surrounds the heart. This is a serious complication. Stroke or blood clots. Damage to nearby structures or organs. Allergic reaction to medicines or dyes. Need for a permanent pacemaker if the normal electrical system is damaged. A pacemaker is a small computer that sends electrical signals to the heart and helps your heart beat normally. The procedure not being  fully effective. This may not be recognized until months later. Repeat ablation procedures are sometimes done. What happens before the procedure? Medicines Ask your health care provider  about: Changing or stopping your regular medicines. This is especially important if you are taking diabetes medicines or blood thinners. Taking medicines such as aspirin and ibuprofen. These medicines can thin your blood. Do not take these medicines unless your health care provider tells you to take them. Taking over-the-counter medicines, vitamins, herbs, and supplements. General instructions Follow instructions from your health care provider about eating or drinking restrictions. Plan to have someone take you home from the hospital or clinic. If you will be going home right after the procedure, plan to have someone with you for 24 hours. Ask your health care provider what steps will be taken to prevent infection. What happens during the procedure?  An IV will be inserted into one of your veins. You will be given a medicine to help you relax (sedative). The skin on your neck or groin will be numbed. An incision will be made in your neck or your groin. A needle will be inserted through the incision and into a large vein in your neck or groin. A catheter will be inserted into the needle and moved to your heart. Dye may be injected through the catheter to help your surgeon see the area of the heart that needs treatment. Electrical currents will be sent from the catheter to ablate heart tissue in desired areas. There are three types of energy that may be used to do this: Heat (radiofrequency energy). Laser energy. Extreme cold (cryoablation). When the tissue has been ablated, the catheter will be removed. Pressure will be held on the insertion area to prevent a lot of bleeding. A bandage (dressing) will be placed over the insertion area. The exact procedure may vary among health care providers and hospitals. What happens after the procedure? Your blood pressure, heart rate, breathing rate, and blood oxygen level will be monitored until you leave the hospital or clinic. Your insertion area  will be monitored for bleeding. You will need to lie still for a few hours to ensure that you do not bleed from the insertion area. Do not drive for 24 hours or as long as told by your health care provider. Summary Cardiac ablation is a procedure to destroy, or ablate, a small amount of heart tissue using an electrical current. This procedure can improve the heart rhythm or return it to normal. Tell your health care provider about any medical conditions you may have and all medicines you are taking to treat them. This is a safe procedure, but problems may occur. Problems may include infection, bruising, damage to nearby organs or structures, or allergic reactions to medicines. Follow your health care provider's instructions about eating and drinking before the procedure. You may also be told to change or stop some of your medicines. After the procedure, do not drive for 24 hours or as long as told by your health care provider. This information is not intended to replace advice given to you by your health care provider. Make sure you discuss any questions you have with your health care provider. Document Revised: 09/19/2019 Document Reviewed: 09/19/2019 Elsevier Patient Education  [place].

## 2021-08-13 NOTE — Progress Notes (Addendum)
Electrophysiology Office Note:    Date:  08/13/2021   ID:  CADINCE Lowe, DOB 1941/02/19, MRN [id number]  PCP:  [name], MD  [facility] Cardiologist:  Buford Dresser, MD  [facility] Electrophysiologist:  Virl Axe, MD   Referring MD: [name], MD   Chief Complaint: Atrial fibrillation  History of Present Illness:    Kristin Lowe is a 80 y.o. female who presents for an evaluation of atrial fibrillation at the request of Dr. [name] and Oda Kilts, PA-C. Their medical history includes stroke, paroxysmal atrial fibrillation, hypertension.  The patient was last seen by Oda Kilts on July 30, 2021.  At that appointment she reported not being very active because of concern that it would trigger episodes of atrial fibrillation.  She is not interested in taking medications.  She is interested in pursuing ablation and watchman to assist in the management of her atrial fibrillation and her desire to avoid long-term anticoagulation.  She follows her atrial fibrillation burden on her apple watch.  There was increased burden in the 5 to 7 days prior to her appointment with Oda Kilts.  She is with her son today in clinic.  She was recently diagnosed with OSA and is waiting to start treatment with CPAP/BiPAP.  Past Medical History:  Diagnosis Date   Bradycardia    Hypertension    Paroxysmal atrial fibrillation (HCC)    Stroke (cerebrum) [facility])     Past Surgical History:  Procedure Laterality Date   CATARACT EXTRACTION, BILATERAL     LOOP RECORDER INSERTION N/A 05/17/2018   Procedure: LOOP RECORDER INSERTION;  Surgeon: Deboraha Sprang, MD;  Location: [place] [facility];  Service: Cardiovascular;  Laterality: N/A;   LOOP RECORDER INSERTION  05/17/2018   Procedure: LOOP RECORDER INSERTION;  Surgeon: Fay Records, MD;  Location: [place];  Service: Cardiovascular;;   TEE WITHOUT CARDIOVERSION N/A 05/17/2018   Procedure: TRANSESOPHAGEAL ECHOCARDIOGRAM  (TEE);  Surgeon: Fay Records, MD;  Location: [facility] ENDOSCOPY;  Service: Cardiovascular;  Laterality: N/A;   TRIGGER FINGER RELEASE     TUMOR REMOVAL      Current Medications: Current Meds  Medication Sig   apixaban (ELIQUIS) 5 MG TABS tablet Take 1 tablet (5 mg total) by mouth 2 (two) times daily. Overdue for labwork, MUST have repeat labwork at office visit prior to FUTURE refills.   atorvastatin (LIPITOR) 40 MG tablet Take 1 tablet (40 mg total) by mouth daily at 6 PM.   azelastine (ASTELIN) 0.1 % nasal spray Place 2 sprays into the nose 2 (two) times daily.   Cholecalciferol (VITAMIN D) 2000 units CAPS Take 2,000 Units by mouth daily.    clobetasol ointment (TEMOVATE) 0.05 % as needed.   co-enzyme Q-10 50 MG capsule Take 100 mg by mouth daily.   levOCARNitine (CARNITINE, L,) POWD 1 tablet by Does not apply route daily.   Magnesium 250 MG TABS Take 250 mg by mouth daily.   Multiple Vitamins-Calcium (ADVANCED AM/PM PO) Take 1 tablet by mouth daily.   Multiple Vitamins-Minerals (IMMUNE SUPPORT PO) Take 1 tablet by mouth daily.   Omega-3 1000 MG CAPS Take 1 capsule by mouth daily.   Triamcinolone Acetonide (NASACORT AQ NA) Place 1 spray into both nostrils daily as needed (allergies).    TURMERIC PO Take 1 capsule by mouth daily.   verapamil (CALAN) 40 MG tablet Take 1 tablet (40 mg total) by mouth daily.     Allergies:   Patient has no known  allergies.   Social History   Socioeconomic History   Marital status: Single    Spouse name: Not on file   Number of children: Not on file   Years of education: Not on file   Highest education level: Not on file  Occupational History   Not on file  Tobacco Use   Smoking status: Former   Smokeless tobacco: Never  Vaping Use   Vaping Use: Never used  Substance and Sexual Activity   Alcohol use: No   Drug use: Never   Sexual activity: Not on file  Other Topics Concern   Not on file  Social History Narrative   Not on file   Social  Determinants of Health   Financial Resource Strain: Not on file  Food Insecurity: Not on file  Transportation Needs: Not on file  Physical Activity: Not on file  Stress: Not on file  Social Connections: Not on file     Family History: The patient's family history includes Breast cancer in her mother; Diabetes in her brother and mother; Heart disease in her father; Hypertension in her brother and sister; Thyroid disease in her mother and sister.  ROS:   Please see the history of present illness.    All other systems reviewed and are negative.  EKGs/Labs/Other Studies Reviewed:    The following studies were reviewed today:  August 02, 2021 echo personally reviewed Left ventricular function normal, 65% Right ventricular function normal No significant valvular abnormalities Left atrial size was normal  June 25, 2021 loop interrogation shows an 11-hour episode of atrial fibrillation May 23, 2021 loop interrogation shows a 29-hour episode of atrial fibrillation  May 29, 2021 EKG shows sinus rhythm  EKG:  The ekg ordered today demonstrates sinus rhythm.  Recent Labs: 07/12/2021: ALT 19; BUN 14; Creatinine, Ser 0.81; Hemoglobin 13.4; Platelets 278; Potassium 4.4; Sodium 141; TSH 0.564  Recent Lipid Panel    Component Value Date/Time   CHOL 182 05/15/2018 2119   TRIG 115 05/15/2018 2119   HDL 53 05/15/2018 2119   CHOLHDL 3.4 05/15/2018 2119   VLDL 23 05/15/2018 2119   LDLCALC 106 (H) 05/15/2018 2119    Physical Exam:    VS:  BP (!) 142/78   Pulse 72   Ht 5' 6.5" (1.689 m)   Wt 167 lb 12.8 oz (76.1 kg)   SpO2 98%   BMI 26.68 kg/m     Wt Readings from Last 3 Encounters:  08/13/21 167 lb 12.8 oz (76.1 kg)  07/30/21 167 lb (75.8 kg)  07/12/21 167 lb 6.4 oz (75.9 kg)     GEN: Well nourished, well developed in no acute distress.  Appears younger than stated age. HEENT: Normal NECK: No JVD; No carotid bruits LYMPHATICS: No lymphadenopathy CARDIAC: RRR, no  murmurs, rubs, gallops RESPIRATORY:  Clear to auscultation without rales, wheezing or rhonchi  ABDOMEN: Soft, non-tender, non-distended MUSCULOSKELETAL:  No edema; No deformity  SKIN: Warm and dry NEUROLOGIC:  Alert and oriented x 3 PSYCHIATRIC:  Normal affect   ASSESSMENT:    1. Paroxysmal atrial fibrillation (HCC)   2. History of CVA (cerebrovascular accident)   3. Essential hypertension   4. TIA (transient ischemic attack)   5. Sinus bradycardia    PLAN:    In order of problems listed above:  1. Paroxysmal atrial fibrillation (HCC) 2. History of CVA (cerebrovascular accident)  The patient has symptomatic paroxysmal atrial fibrillation.  She is maintained on Eliquis for stroke prophylaxis.  She is interested  in a rhythm control strategy that avoids long-term exposure to drug therapy.  She has trouble with symptomatic bradycardia with some medications which also is contributing to her desire to pursue ablation.  I think she is overall an excellent candidate for ablation.  We discussed the procedure in detail with the patient and her son during today's visit including the risks, recovery and efficacy.  She would like to proceed with scheduling.  We discussed using the ablation procedure in conjunction with a watchman implant to help avoid long-term exposure anticoagulation while still providing protection against stroke.  I discussed the procedure in detail with the patient.  She would like to schedule these in sequence.  We will need a CT scan prior to the PVI which also serve as the prewatchman planning evaluation.  Risk, benefits, and alternatives to EP study and radiofrequency ablation for afib were also discussed in detail today. These risks include but are not limited to stroke, bleeding, vascular damage, tamponade, perforation, damage to the esophagus, lungs, and other structures, pulmonary vein stenosis, worsening renal function, and death. The patient understands these risk and  wishes to proceed.  We will therefore proceed with catheter ablation at the next available time.  Carto, ICE, anesthesia are requested for the procedure.  Will also obtain CT PV protocol prior to the procedure to exclude LAA thrombus and further evaluate atrial anatomy.  ----------------------------  I have seen Ellouise Newer in the office today who is being considered for a Watchman left atrial appendage closure device. I believe they will benefit from this procedure given their history of atrial fibrillation, CHA2DS2-VASc score of at least 5 and unadjusted ischemic stroke rate of 7.2% per year. The patient would like to avoid long term exposure to anticoagulation. The patient's chart has been reviewed and I feel that they would be a candidate for short term oral anticoagulation after Watchman implant.   It is my belief that after undergoing a LAA closure procedure, ICIS BUDREAU will not need long term anticoagulation which eliminates anticoagulation side effects and major bleeding risk.   Procedural risks for the Watchman implant have been reviewed with the patient including a 0.5% risk of stroke, <1% risk of perforation and <1% risk of device embolization.    The published clinical data on the safety and effectiveness of WATCHMAN include but are not limited to the following: - Holmes DR, Mechele Claude, Sick P et al. for the PROTECT AF Investigators. Percutaneous closure of the left atrial appendage versus warfarin therapy for prevention of stroke in patients with atrial fibrillation: a randomised non-inferiority trial. Lancet 2009; 374: 534-42. Mechele Claude, Doshi SK, Abelardo Diesel D et al. on behalf of the PROTECT AF Investigators. Percutaneous Left Atrial Appendage Closure for Stroke Prophylaxis in Patients With Atrial Fibrillation 2.3-Year Follow-up of the PROTECT AF (Watchman Left Atrial Appendage System for Embolic Protection in Patients With Atrial Fibrillation) Trial. Circulation 2013;  127:720-729. - Alli O, Doshi S,  Kar S, Reddy VY, Sievert H et al. Quality of Life Assessment in the Randomized PROTECT AF (Percutaneous Closure of the Left Atrial Appendage Versus Warfarin Therapy for Prevention of Stroke in Patients With Atrial Fibrillation) Trial of Patients at Risk for Stroke With Nonvalvular Atrial Fibrillation. J Am Coll Cardiol 2013; [place], Tarri Abernethy, Price M, Whisenant B, Sievert H, Doshi S, Huber K, Reddy V. Prospective randomized evaluation of the Watchman left atrial appendage Device in patients with atrial fibrillation versus long-term warfarin therapy; the  PREVAIL trial. Journal of the [employer] of Cardiology, Vol. 4, No. 1, 2014, 1-11. - Kar S, Doshi SK, Sadhu A, Horton R, Osorio J et al. Primary outcome evaluation of a next-generation left atrial appendage closure device: results from the PINNACLE FLX trial. Circulation 2021;143(18)1754-1762.   After today's visit with the patient which was dedicated solely for shared decision making visit regarding LAA closure device, the patient decided to proceed with the LAA appendage closure procedure scheduled to be done in the near future at [facility].   3. Essential hypertension Controlled. Continue current regimen.  4. Sinus bradycardia Limits use of antiarrhythmic drug therapy.  5. OSA She was recently diagnosed with OSA and is waiting to start treatment with CPAP/BiPAP.     Total time spent with patient today 45 minutes. This includes reviewing records, evaluating the patient and coordinating care.  Medication Adjustments/Labs and Tests Ordered: Current medicines are reviewed at length with the patient today.  Concerns regarding medicines are outlined above.  Orders Placed This Encounter  Procedures   CT CARDIAC MORPH/PULM VEIN W/CM&W/O CA SCORE   Basic Metabolic Panel (BMET)   CBC w/Diff   EKG 12-Lead    No orders of the defined types were placed in this  encounter.    Signed, Hilton Cork. Quentin Ore, MD, [facility], [facility] 08/13/2021 5:32 PM    Electrophysiology [facility]

## 2021-08-26 ENCOUNTER — Encounter: Payer: Self-pay | Admitting: Gastroenterology

## 2021-08-26 DIAGNOSIS — Z124 Encounter for screening for malignant neoplasm of cervix: Secondary | ICD-10-CM | POA: Diagnosis not present

## 2021-08-26 DIAGNOSIS — L900 Lichen sclerosus et atrophicus: Secondary | ICD-10-CM | POA: Diagnosis not present

## 2021-08-26 DIAGNOSIS — Z01419 Encounter for gynecological examination (general) (routine) without abnormal findings: Secondary | ICD-10-CM | POA: Diagnosis not present

## 2021-08-26 DIAGNOSIS — Z23 Encounter for immunization: Secondary | ICD-10-CM | POA: Diagnosis not present

## 2021-08-26 DIAGNOSIS — Z01411 Encounter for gynecological examination (general) (routine) with abnormal findings: Secondary | ICD-10-CM | POA: Diagnosis not present

## 2021-08-26 DIAGNOSIS — Z6827 Body mass index (BMI) 27.0-27.9, adult: Secondary | ICD-10-CM | POA: Diagnosis not present

## 2021-08-28 DIAGNOSIS — S81801A Unspecified open wound, right lower leg, initial encounter: Secondary | ICD-10-CM | POA: Diagnosis not present

## 2021-09-02 ENCOUNTER — Ambulatory Visit (INDEPENDENT_AMBULATORY_CARE_PROVIDER_SITE_OTHER): Payer: Medicare Other

## 2021-09-02 DIAGNOSIS — I480 Paroxysmal atrial fibrillation: Secondary | ICD-10-CM

## 2021-09-03 ENCOUNTER — Other Ambulatory Visit: Payer: Self-pay

## 2021-09-03 ENCOUNTER — Other Ambulatory Visit: Payer: Medicare Other | Admitting: *Deleted

## 2021-09-03 DIAGNOSIS — Z8673 Personal history of transient ischemic attack (TIA), and cerebral infarction without residual deficits: Secondary | ICD-10-CM | POA: Diagnosis not present

## 2021-09-03 DIAGNOSIS — I10 Essential (primary) hypertension: Secondary | ICD-10-CM | POA: Diagnosis not present

## 2021-09-03 DIAGNOSIS — I480 Paroxysmal atrial fibrillation: Secondary | ICD-10-CM

## 2021-09-03 DIAGNOSIS — G459 Transient cerebral ischemic attack, unspecified: Secondary | ICD-10-CM | POA: Diagnosis not present

## 2021-09-03 DIAGNOSIS — R001 Bradycardia, unspecified: Secondary | ICD-10-CM | POA: Diagnosis not present

## 2021-09-03 LAB — CBC WITH DIFFERENTIAL/PLATELET
Basophils Absolute: 0.1 10*3/uL (ref 0.0–0.2)
Basos: 1 %
EOS (ABSOLUTE): 0.2 10*3/uL (ref 0.0–0.4)
Eos: 4 %
Hematocrit: 37.4 % (ref 34.0–46.6)
Hemoglobin: 12.7 g/dL (ref 11.1–15.9)
Immature Grans (Abs): 0 10*3/uL (ref 0.0–0.1)
Immature Granulocytes: 0 %
Lymphocytes Absolute: 1.4 10*3/uL (ref 0.7–3.1)
Lymphs: 23 %
MCH: 30.5 pg (ref 26.6–33.0)
MCHC: 34 g/dL (ref 31.5–35.7)
MCV: 90 fL (ref 79–97)
Monocytes Absolute: 0.6 10*3/uL (ref 0.1–0.9)
Monocytes: 10 %
Neutrophils Absolute: 3.9 10*3/uL (ref 1.4–7.0)
Neutrophils: 62 %
Platelets: 280 10*3/uL (ref 150–450)
RBC: 4.16 x10E6/uL (ref 3.77–5.28)
RDW: 12.2 % (ref 11.7–15.4)
WBC: 6.2 10*3/uL (ref 3.4–10.8)

## 2021-09-03 LAB — BASIC METABOLIC PANEL
BUN/Creatinine Ratio: 24 (ref 12–28)
BUN: 18 mg/dL (ref 8–27)
CO2: 25 mmol/L (ref 20–29)
Calcium: 10.7 mg/dL — ABNORMAL HIGH (ref 8.7–10.3)
Chloride: 103 mmol/L (ref 96–106)
Creatinine, Ser: 0.75 mg/dL (ref 0.57–1.00)
Glucose: 97 mg/dL (ref 70–99)
Potassium: 4.5 mmol/L (ref 3.5–5.2)
Sodium: 139 mmol/L (ref 134–144)
eGFR: 81 mL/min/{1.73_m2} (ref >59)

## 2021-09-04 LAB — CUP PACEART REMOTE DEVICE CHECK
Date Time Interrogation Session: 20221007022236
Implantable Pulse Generator Implant Date: 20190624

## 2021-09-07 DIAGNOSIS — Z23 Encounter for immunization: Secondary | ICD-10-CM | POA: Diagnosis not present

## 2021-09-09 ENCOUNTER — Telehealth (HOSPITAL_COMMUNITY): Payer: Self-pay | Admitting: Emergency Medicine

## 2021-09-09 ENCOUNTER — Other Ambulatory Visit: Payer: Self-pay

## 2021-09-09 ENCOUNTER — Ambulatory Visit (HOSPITAL_BASED_OUTPATIENT_CLINIC_OR_DEPARTMENT_OTHER): Payer: Medicare Other | Attending: Cardiovascular Disease | Admitting: Cardiovascular Disease

## 2021-09-09 DIAGNOSIS — G4733 Obstructive sleep apnea (adult) (pediatric): Secondary | ICD-10-CM | POA: Insufficient documentation

## 2021-09-09 NOTE — Telephone Encounter (Signed)
Reaching out to patient to offer assistance regarding upcoming cardiac imaging study; pt verbalizes understanding of appt date/time, parking situation and where to check in, pre-test NPO status and medications ordered, and verified current allergies; name and call back number provided for further questions should they arise Kristin Bond RN Navigator Cardiac Imaging [facility] [phone] office [phone] cell  Denies iv issues Daily meds

## 2021-09-10 ENCOUNTER — Encounter (HOSPITAL_COMMUNITY): Payer: Self-pay

## 2021-09-10 ENCOUNTER — Ambulatory Visit (HOSPITAL_COMMUNITY)
Admission: RE | Admit: 2021-09-10 | Discharge: 2021-09-10 | Disposition: A | Discharge status: Home / Self Care | Payer: Medicare Other | Source: Ambulatory Visit | Attending: Cardiology | Admitting: Cardiology

## 2021-09-10 DIAGNOSIS — R001 Bradycardia, unspecified: Secondary | ICD-10-CM | POA: Diagnosis not present

## 2021-09-10 DIAGNOSIS — I10 Essential (primary) hypertension: Secondary | ICD-10-CM | POA: Diagnosis not present

## 2021-09-10 DIAGNOSIS — I480 Paroxysmal atrial fibrillation: Secondary | ICD-10-CM | POA: Insufficient documentation

## 2021-09-10 DIAGNOSIS — Z8673 Personal history of transient ischemic attack (TIA), and cerebral infarction without residual deficits: Secondary | ICD-10-CM | POA: Insufficient documentation

## 2021-09-10 DIAGNOSIS — G459 Transient cerebral ischemic attack, unspecified: Secondary | ICD-10-CM | POA: Diagnosis not present

## 2021-09-10 MED ORDER — IOHEXOL 350 MG/ML SOLN
80.0000 mL | Freq: Once | INTRAVENOUS | Status: AC | PRN
  Administered 2021-09-10: 80 mL via INTRAVENOUS

## 2021-09-10 NOTE — Progress Notes (Signed)
[facility] Summary Report / Loop Recorder 

## 2021-09-11 ENCOUNTER — Other Ambulatory Visit: Payer: Self-pay

## 2021-09-11 ENCOUNTER — Ambulatory Visit (INDEPENDENT_AMBULATORY_CARE_PROVIDER_SITE_OTHER): Payer: Medicare Other | Admitting: Internal Medicine

## 2021-09-11 ENCOUNTER — Encounter: Payer: Self-pay | Admitting: Internal Medicine

## 2021-09-11 DIAGNOSIS — R053 Chronic cough: Secondary | ICD-10-CM

## 2021-09-11 LAB — CBC WITH DIFFERENTIAL/PLATELET
Basophils Absolute: 0 10*3/uL (ref 0.0–0.1)
Basophils Relative: 0.5 % (ref 0.0–3.0)
Eosinophils Absolute: 0.1 10*3/uL (ref 0.0–0.7)
Eosinophils Relative: 2.2 % (ref 0.0–5.0)
HCT: 39.4 % (ref 36.0–46.0)
Hemoglobin: 12.8 g/dL (ref 12.0–15.0)
Lymphocytes Relative: 22.6 % (ref 12.0–46.0)
Lymphs Abs: 1.3 10*3/uL (ref 0.7–4.0)
MCHC: 32.6 g/dL (ref 30.0–36.0)
MCV: 90.9 fl (ref 78.0–100.0)
Monocytes Absolute: 0.6 10*3/uL (ref 0.1–1.0)
Monocytes Relative: 10.3 % (ref 3.0–12.0)
Neutro Abs: 3.8 10*3/uL (ref 1.4–7.7)
Neutrophils Relative %: 64.4 % (ref 43.0–77.0)
Platelets: 264 10*3/uL (ref 150.0–400.0)
RBC: 4.34 Mil/uL (ref 3.87–5.11)
RDW: 13.4 % (ref 11.5–15.5)
WBC: 6 10*3/uL (ref 4.0–10.5)

## 2021-09-11 MED ORDER — PANTOPRAZOLE SODIUM 40 MG PO TBEC: 40.0000 mg | DELAYED_RELEASE_TABLET | Freq: Every day | ORAL | 2 refills | Status: DC

## 2021-09-11 MED ORDER — FAMOTIDINE 20 MG PO TABS: ORAL_TABLET | ORAL | 11 refills | Status: DC

## 2021-09-11 NOTE — Progress Notes (Signed)
Kristin Lowe, female    DOB: [birth year],  MRN: [id number]   Brief patient profile:  28 yowf from [place] quit smoking 1992 with frequent bouts of cough some better p quit smoking and worse since around the time of covid = spring of 2020 referred to pulmonary clinic 09/11/2021 by Dr   Kristin Lowe for cough        History of Present Illness  09/11/2021  Pulmonary/ 1st office eval/Kristin Lowe  Chief Complaint  Patient presents with   Pulmonary Consult    Referred by Dr. Crist Lowe. Pt c/o cough for the past 3 years, worse over the past year. She used to live in [place] and never had cough.  Her cough is prod with green sputum.    Dyspnea:  Not limited by breathing from desired activities   Cough: p stirs  pretty much each am > green mucus x tsp x up to up to an hour  then good for the rest of the day x sometimes with meals/ uses nettipot with variably purulent sputum / severe cough to point of choking  Sleep: on side bed flat with big pillow SABA use: none Spring time rhinitis worse since living  in [place]   No obvious other patterns in  day to day or daytime variability or assoc mucus plugs or hemoptysis or cp or chest tightness, subjective wheeze or overt sinus or hb symptoms.   Sleeping  without nocturnal    exacerbation  of respiratory  c/o's or need for noct saba. Also denies any obvious fluctuation of symptoms with weather or environmental changes or other aggravating or alleviating factors except as outlined above   No unusual exposure hx or h/o childhood pna/ asthma or knowledge of premature birth.  Current Allergies, Complete Past Medical History, Past Surgical History, Family History, and Social History were reviewed in [employer] record.  ROS  The following are not active complaints unless bolded Hoarseness, sore throat, dysphagia, dental problems, itching, sneezing,  nasal congestion or discharge of excess mucus or purulent secretions, ear ache,   fever,  chills, sweats, unintended wt loss or wt gain, classically pleuritic or exertional cp,  orthopnea pnd or arm/hand swelling  or leg swelling, presyncope, palpitations, abdominal pain, anorexia, nausea, vomiting, diarrhea  or change in bowel habits or change in bladder habits, change in stools or change in urine, dysuria, hematuria,  rash, arthralgias, visual complaints, headache, numbness, weakness or ataxia or problems with walking or coordination,  change in mood or  memory.           Past Medical History:  Diagnosis Date   Bradycardia    Hypertension    Paroxysmal atrial fibrillation (HCC)    Stroke (cerebrum) (HCC)     Outpatient Medications Prior to Visit  Medication Sig Dispense Refill   apixaban (ELIQUIS) 5 MG TABS tablet Take 1 tablet (5 mg total) by mouth 2 (two) times daily. Overdue for labwork, MUST have repeat labwork at office visit prior to FUTURE refills. 180 tablet 0   atorvastatin (LIPITOR) 40 MG tablet Take 1 tablet (40 mg total) by mouth daily at 6 PM. 30 tablet 3   Cholecalciferol (VITAMIN D) 2000 units CAPS Take 2,000 Units by mouth daily.      clobetasol ointment (TEMOVATE) 0.05 % as needed.     co-enzyme Q-10 50 MG capsule Take 100 mg by mouth daily.     levOCARNitine (CARNITINE, L,) POWD 1 tablet by Does not apply route daily.  Magnesium 250 MG TABS Take 250 mg by mouth daily.     Multiple Vitamins-Calcium (ADVANCED AM/PM PO) Take 1 tablet by mouth daily.     Multiple Vitamins-Minerals (IMMUNE SUPPORT PO) Take 1 tablet by mouth daily.     Omega-3 1000 MG CAPS Take 1 capsule by mouth daily.     Triamcinolone Acetonide (NASACORT AQ NA) Place 1 spray into both nostrils daily as needed (allergies).      TURMERIC PO Take 1 capsule by mouth daily.     verapamil (CALAN) 40 MG tablet Take 1 tablet (40 mg total) by mouth daily. 90 tablet 3   azelastine (ASTELIN) 0.1 % nasal spray Place 2 sprays into the nose 2 (two) times daily.     No facility-administered medications  prior to visit.     Objective:     BP 128/60 (BP Location: Left Arm, Cuff Size: Normal)   Pulse 62   Temp 98.2 F (36.8 C) (Oral)   Ht 5\' 6"  (1.676 m)   Wt 168 lb (76.2 kg)   SpO2 99% Comment: on RA  BMI 27.12 kg/m   SpO2: 99 % (on RA)  Amb pleasant wf nad / prominent pseudowheeze better with plm    HEENT : pt wearing mask not removed for exam due to covid -19 concerns.    NECK :  without JVD/Nodes/TM/ nl carotid upstrokes bilaterally   LUNGS: no acc muscle use,  Nl contour chest which is clear to A and P bilaterally without cough on insp or exp maneuvers   CV:  RRR  no s3 or murmur or increase in P2, and no edema   ABD:  soft and nontender with nl inspiratory excursion in the supine position. No bruits or organomegaly appreciated, bowel sounds nl  MS:  Nl gait/ ext warm without deformities, calf tenderness, cyanosis or clubbing No obvious joint restrictions   SKIN: warm and dry without lesions    NEURO:  alert, approp, nl sensorium with  no motor or cerebellar deficits apparent.      I personally reviewed images and agree with radiology impression as follows:   Chest CT cororonary 09/10/21 Geographic scarring and opacity in the superior and anterior right middle lobe extending to the pleural surface. This most likely is representative of postinflammatory scarring.    Assessment   Chronic cough Onset while smoking quit in 1992 - Allergy profile 09/11/2021 >  Eos 0.1 /  IgE   - Gerd rx trial 09/11/2021  - Sinus CT 09/11/2021 >>>    The most common causes of chronic cough in immunocompetent adults include the following: upper airway cough syndrome (UACS), previously referred to as postnasal drip syndrome (PNDS), which is caused by variety of rhinosinus conditions; (2) asthma; (3) GERD; (4) chronic bronchitis from cigarette smoking or other inhaled environmental irritants; (5) nonasthmatic eosinophilic bronchitis; and (6) bronchiectasis.   These conditions,  singly or in combination, have accounted for up to 94% of the causes of chronic cough in prospective studies.   Other conditions have constituted no >6% of the causes in prospective studies These have included bronchogenic carcinoma, chronic interstitial pneumonia, sarcoidosis, left ventricular failure, ACEI-induced cough, and aspiration from a condition associated with pharyngeal dysfunction.    Chronic cough is often simultaneously caused by more than one condition. A single cause has been found from 38 to 82% of the time, multiple causes from 18 to 62%. Multiply caused cough has been the result of three diseases up to 42% of the  time.    Therefore statistically good chance she has more than one mech driving this cough with the am flare most suggestive being sinusitis/ gerd  With bronchiectasis also a concern (would need HRCT to sort out) so for now rec max rx for gerd and eval sinuses with sinus CT and f/u in 6 weeks with pfts on return.   Advised: The standardized cough guidelines published in Chest by Lissa Morales in 2006 are still the best available and consist of a multiple step process (up to 12!) , not a single office visit,  and are intended  to address this problem logically,  with an alogrithm dependent on response to empiric treatment at  each progressive step  to determine a specific diagnosis with  minimal addtional testing needed. Therefore if adherence is an issue or can't be accurately verified,  it's very unlikely the standard evaluation and treatment will be successful here.    Furthermore, response to therapy (other than acute cough suppression, which should only be used short term with avoidance of narcotic containing cough syrups if possible), can be a gradual process for which the patient is not likely to  perceive immediate benefit.  Unlike going to an eye doctor where the best perscription is almost always the first one and is immediately effective, this is almost never the  case in the management of chronic cough syndromes. Therefore the patient needs to commit up front to consistently adhere to recommendations  for up to 6 weeks of therapy directed at the likely underlying problem(s) before the response can be reasonably evaluated.          Each maintenance medication was reviewed in detail including emphasizing most importantly the difference between maintenance and prns and under what circumstances the prns are to be triggered using an action plan format where appropriate.  Total time for H and P, chart review, counseling, reviewing nettipot  device(s) and generating customized AVS unique to this office visit / same day charting  >  45 min              Christinia Gully, MD 09/11/2021

## 2021-09-11 NOTE — Patient Instructions (Signed)
Pantoprazole (protonix) 40 mg   Take  30-60 min before first meal of the day and Pepcid (famotidine)  20 mg after supper until return to office - this is the best way to tell whether stomach acid is contributing to your problem.   GERD (REFLUX)  is an extremely common cause of respiratory symptoms just like yours , many times with no obvious heartburn at all.    It can be treated with medication, but also with lifestyle changes including elevation of the head of your bed (ideally with 6 -8inch blocks under the headboard of your bed),  Smoking cessation, avoidance of late meals, excessive alcohol, and avoid fatty foods, chocolate, peppermint, colas, red wine, and acidic juices such as orange juice.  NO MINT OR MENTHOL PRODUCTS SO NO COUGH DROPS  USE SUGARLESS CANDY INSTEAD (Jolley ranchers or Stover's or Life Savers) or even ice chips will also do - the key is to swallow to prevent all throat clearing. NO OIL BASED VITAMINS - use powdered substitutes.  Avoid fish oil when coughing.    Please remember to go to the lab department   for your tests - we will call you with the results when they are available.    We'll call with a sinus CT schedule   Please schedule a follow up office visit in 6 weeks, call sooner if needed pfts

## 2021-09-11 NOTE — Assessment & Plan Note (Addendum)
Onset while smoking quit in 1992 - Allergy profile 09/11/2021 >  Eos 0.1 /  IgE   - Gerd rx trial 09/11/2021  - Sinus CT 09/11/2021 >>>    The most common causes of chronic cough in immunocompetent adults include the following: upper airway cough syndrome (UACS), previously referred to as postnasal drip syndrome (PNDS), which is caused by variety of rhinosinus conditions; (2) asthma; (3) GERD; (4) chronic bronchitis from cigarette smoking or other inhaled environmental irritants; (5) nonasthmatic eosinophilic bronchitis; and (6) bronchiectasis.   These conditions, singly or in combination, have accounted for up to 94% of the causes of chronic cough in prospective studies.   Other conditions have constituted no >6% of the causes in prospective studies These have included bronchogenic carcinoma, chronic interstitial pneumonia, sarcoidosis, left ventricular failure, ACEI-induced cough, and aspiration from a condition associated with pharyngeal dysfunction.    Chronic cough is often simultaneously caused by more than one condition. A single cause has been found from 38 to 82% of the time, multiple causes from 18 to 62%. Multiply caused cough has been the result of three diseases up to 42% of the time.    Therefore statistically good chance she has more than one mech driving this cough with the am flare most suggestive being sinusitis/ gerd  With bronchiectasis also a concern (would need HRCT to sort out) so for now rec max rx for gerd and eval sinuses with sinus CT and f/u in 6 weeks with pfts on return.   Advised: The standardized cough guidelines published in Chest by Lissa Morales in 2006 are still the best available and consist of a multiple step process (up to 12!) , not a single office visit,  and are intended  to address this problem logically,  with an alogrithm dependent on response to empiric treatment at  each progressive step  to determine a specific diagnosis with  minimal addtional  testing needed. Therefore if adherence is an issue or can't be accurately verified,  it's very unlikely the standard evaluation and treatment will be successful here.    Furthermore, response to therapy (other than acute cough suppression, which should only be used short term with avoidance of narcotic containing cough syrups if possible), can be a gradual process for which the patient is not likely to  perceive immediate benefit.  Unlike going to an eye doctor where the best perscription is almost always the first one and is immediately effective, this is almost never the case in the management of chronic cough syndromes. Therefore the patient needs to commit up front to consistently adhere to recommendations  for up to 6 weeks of therapy directed at the likely underlying problem(s) before the response can be reasonably evaluated.          Each maintenance medication was reviewed in detail including emphasizing most importantly the difference between maintenance and prns and under what circumstances the prns are to be triggered using an action plan format where appropriate.  Total time for H and P, chart review, counseling, reviewing nettipot  device(s) and generating customized AVS unique to this office visit / same day charting  >  45 min

## 2021-09-12 LAB — IGE: IgE (Immunoglobulin E), Serum: 4 kU/L (ref <=114)

## 2021-09-15 NOTE — Anesthesia Preprocedure Evaluation (Addendum)
Anesthesia Evaluation  Patient identified by MRN, date of birth, ID band Patient awake    Reviewed: Allergy & Precautions, NPO status , Patient's Chart, lab work & pertinent test results  Airway Mallampati: II  TM Distance: >3 FB Neck ROM: Full    Dental  (+) Teeth Intact, Dental Advisory Given, Chipped, Caps,    Pulmonary sleep apnea , former smoker,    Pulmonary exam normal breath sounds clear to auscultation       Cardiovascular hypertension, Pt. on medications + dysrhythmias Atrial Fibrillation  Rhythm:Regular Rate:Normal  08/02/2021 TTE 1. Left ventricular ejection fraction, by estimation, is 65 to 70%. Left  ventricular ejection fraction by 3D volume is 68 %. The left ventricle has  normal function. The left ventricle has no regional wall motion  abnormalities. Left ventricular diastolic  parameters are consistent with Grade I diastolic dysfunction (impaired  relaxation). The average left ventricular global longitudinal strain is  -22.5 %. The global longitudinal strain is normal.  2. Normal RV free wall strain. Right ventricular systolic function is  hyperdynamic. The right ventricular size is normal.  3. Normal LA resevoir strain.  4. The mitral valve is grossly normal. Trivial mitral valve  regurgitation.    Neuro/Psych TIACVA negative psych ROS   GI/Hepatic Neg liver ROS, GERD  Medicated,  Endo/Other  negative endocrine ROS  Renal/GU      Musculoskeletal   Abdominal   Peds  Hematology  (+) Blood dyscrasia (Eliquis), ,   Anesthesia Other Findings   Reproductive/Obstetrics                          Anesthesia Physical Anesthesia Plan  ASA: 3  Anesthesia Plan: General   Post-op Pain Management:    Induction: Intravenous  PONV Risk Score and Plan: 3 and Dexamethasone, Ondansetron and Treatment may vary due to age or medical condition  Airway Management Planned: Oral  ETT  Additional Equipment:   Intra-op Plan:   Post-operative Plan: Extubation in OR  Informed Consent: I have reviewed the patients History and Physical, chart, labs and discussed the procedure including the risks, benefits and alternatives for the proposed anesthesia with the patient or authorized representative who has indicated his/her understanding and acceptance.     Dental advisory given  Plan Discussed with: CRNA  Anesthesia Plan Comments:       Anesthesia Quick Evaluation

## 2021-09-16 NOTE — Pre-Procedure Instructions (Signed)
Instructed patient on the following items: Arrival time 053 Nothing to eat or drink after midnight No meds AM of procedure Responsible person to drive you home and stay with you for 24 hrs  Have you missed any doses of anti-coagulant Eliquis- hasn't missed any doses

## 2021-09-17 ENCOUNTER — Ambulatory Visit (HOSPITAL_COMMUNITY): Payer: Medicare Other | Admitting: Anesthesiology

## 2021-09-17 ENCOUNTER — Other Ambulatory Visit: Payer: Self-pay

## 2021-09-17 ENCOUNTER — Encounter (HOSPITAL_COMMUNITY): Payer: Self-pay | Admitting: Cardiology

## 2021-09-17 ENCOUNTER — Encounter (HOSPITAL_COMMUNITY)
Admission: RE | Disposition: A | Discharge status: Home / Self Care | Payer: Self-pay | Source: Home / Self Care | Attending: Cardiology

## 2021-09-17 ENCOUNTER — Ambulatory Visit (HOSPITAL_COMMUNITY)
Admission: RE | Admit: 2021-09-17 | Discharge: 2021-09-17 | Disposition: A | Discharge status: Home / Self Care | Payer: Medicare Other | Attending: Cardiology | Admitting: Cardiology

## 2021-09-17 DIAGNOSIS — Z7901 Long term (current) use of anticoagulants: Secondary | ICD-10-CM | POA: Diagnosis not present

## 2021-09-17 DIAGNOSIS — I10 Essential (primary) hypertension: Secondary | ICD-10-CM | POA: Diagnosis not present

## 2021-09-17 DIAGNOSIS — Z8673 Personal history of transient ischemic attack (TIA), and cerebral infarction without residual deficits: Secondary | ICD-10-CM | POA: Diagnosis not present

## 2021-09-17 DIAGNOSIS — R001 Bradycardia, unspecified: Secondary | ICD-10-CM | POA: Insufficient documentation

## 2021-09-17 DIAGNOSIS — G4733 Obstructive sleep apnea (adult) (pediatric): Secondary | ICD-10-CM | POA: Diagnosis not present

## 2021-09-17 DIAGNOSIS — I480 Paroxysmal atrial fibrillation: Secondary | ICD-10-CM | POA: Insufficient documentation

## 2021-09-17 DIAGNOSIS — Z79899 Other long term (current) drug therapy: Secondary | ICD-10-CM | POA: Insufficient documentation

## 2021-09-17 DIAGNOSIS — K219 Gastro-esophageal reflux disease without esophagitis: Secondary | ICD-10-CM | POA: Diagnosis not present

## 2021-09-17 HISTORY — PX: ATRIAL FIBRILLATION ABLATION: EP1191

## 2021-09-17 LAB — POCT ACTIVATED CLOTTING TIME
Activated Clotting Time: 237 seconds
Activated Clotting Time: 289 seconds
Activated Clotting Time: 317 seconds

## 2021-09-17 SURGERY — ATRIAL FIBRILLATION ABLATION
Anesthesia: General

## 2021-09-17 MED ORDER — ISOPROTERENOL HCL 0.2 MG/ML IJ SOLN
INTRAVENOUS | Status: DC | PRN
  Administered 2021-09-17: 2 ug/min via INTRAVENOUS

## 2021-09-17 MED ORDER — SODIUM CHLORIDE 0.9 % IV SOLN: 250.0000 mL | INTRAVENOUS | Status: DC | PRN

## 2021-09-17 MED ORDER — PANTOPRAZOLE SODIUM 40 MG PO TBEC
40.0000 mg | DELAYED_RELEASE_TABLET | Freq: Every day | ORAL | Status: DC
  Administered 2021-09-17: 40 mg via ORAL
  Filled 2021-09-17: qty 1

## 2021-09-17 MED ORDER — DEXAMETHASONE SODIUM PHOSPHATE 10 MG/ML IJ SOLN
INTRAMUSCULAR | Status: DC | PRN
  Administered 2021-09-17: 5 mg via INTRAVENOUS

## 2021-09-17 MED ORDER — PROTAMINE SULFATE 10 MG/ML IV SOLN
INTRAVENOUS | Status: DC | PRN
  Administered 2021-09-17: 30 mg via INTRAVENOUS

## 2021-09-17 MED ORDER — SODIUM CHLORIDE 0.9% FLUSH: 3.0000 mL | Freq: Two times a day (BID) | INTRAVENOUS | Status: DC

## 2021-09-17 MED ORDER — HEPARIN SODIUM (PORCINE) 1000 UNIT/ML IJ SOLN
INTRAMUSCULAR | Status: DC | PRN
  Administered 2021-09-17: 1000 [IU] via INTRAVENOUS

## 2021-09-17 MED ORDER — ACETAMINOPHEN 500 MG PO TABS
1000.0000 mg | ORAL_TABLET | Freq: Once | ORAL | Status: AC
  Filled 2021-09-17: qty 2

## 2021-09-17 MED ORDER — ROCURONIUM BROMIDE 10 MG/ML (PF) SYRINGE
PREFILLED_SYRINGE | INTRAVENOUS | Status: DC | PRN
  Administered 2021-09-17: 60 mg via INTRAVENOUS
  Administered 2021-09-17 (×2): 20 mg via INTRAVENOUS

## 2021-09-17 MED ORDER — SODIUM CHLORIDE 0.9% FLUSH: 3.0000 mL | INTRAVENOUS | Status: DC | PRN

## 2021-09-17 MED ORDER — ACETAMINOPHEN 325 MG PO TABS: 650.0000 mg | ORAL_TABLET | ORAL | Status: DC | PRN

## 2021-09-17 MED ORDER — PROPOFOL 10 MG/ML IV BOLUS
INTRAVENOUS | Status: DC | PRN
  Administered 2021-09-17: 100 mg via INTRAVENOUS

## 2021-09-17 MED ORDER — SUGAMMADEX SODIUM 200 MG/2ML IV SOLN
INTRAVENOUS | Status: DC | PRN
  Administered 2021-09-17: 200 mg via INTRAVENOUS

## 2021-09-17 MED ORDER — SODIUM CHLORIDE 0.9 % IV SOLN: INTRAVENOUS | Status: DC

## 2021-09-17 MED ORDER — PHENYLEPHRINE HCL-NACL 20-0.9 MG/250ML-% IV SOLN
INTRAVENOUS | Status: DC | PRN
  Administered 2021-09-17: 25 ug/min via INTRAVENOUS

## 2021-09-17 MED ORDER — ACETAMINOPHEN 500 MG PO TABS
ORAL_TABLET | ORAL | Status: AC
  Administered 2021-09-17: 1000 mg via ORAL
  Filled 2021-09-17: qty 2

## 2021-09-17 MED ORDER — FENTANYL CITRATE (PF) 250 MCG/5ML IJ SOLN
INTRAMUSCULAR | Status: DC | PRN
  Administered 2021-09-17: 25 ug via INTRAVENOUS
  Administered 2021-09-17: 75 ug via INTRAVENOUS

## 2021-09-17 MED ORDER — EPHEDRINE SULFATE-NACL 50-0.9 MG/10ML-% IV SOSY
PREFILLED_SYRINGE | INTRAVENOUS | Status: DC | PRN
  Administered 2021-09-17 (×2): 10 mg via INTRAVENOUS

## 2021-09-17 MED ORDER — APIXABAN 5 MG PO TABS
5.0000 mg | ORAL_TABLET | Freq: Two times a day (BID) | ORAL | Status: DC
  Administered 2021-09-17: 5 mg via ORAL
  Filled 2021-09-17: qty 1

## 2021-09-17 MED ORDER — ONDANSETRON HCL 4 MG/2ML IJ SOLN: 4.0000 mg | Freq: Four times a day (QID) | INTRAMUSCULAR | Status: DC | PRN

## 2021-09-17 MED ORDER — LIDOCAINE 2% (20 MG/ML) 5 ML SYRINGE
INTRAMUSCULAR | Status: DC | PRN
  Administered 2021-09-17: 80 mg via INTRAVENOUS

## 2021-09-17 MED ORDER — HEPARIN (PORCINE) IN NACL 1000-0.9 UT/500ML-% IV SOLN
INTRAVENOUS | Status: DC | PRN
  Administered 2021-09-17 (×4): 500 mL

## 2021-09-17 MED ORDER — ISOPROTERENOL HCL 0.2 MG/ML IJ SOLN
INTRAMUSCULAR | Status: AC
  Filled 2021-09-17: qty 5

## 2021-09-17 MED ORDER — HEPARIN SODIUM (PORCINE) 1000 UNIT/ML IJ SOLN
INTRAMUSCULAR | Status: DC | PRN
  Administered 2021-09-17 (×2): 6000 [IU] via INTRAVENOUS
  Administered 2021-09-17: 12000 [IU] via INTRAVENOUS

## 2021-09-17 MED ORDER — HEPARIN SODIUM (PORCINE) 1000 UNIT/ML IJ SOLN
INTRAMUSCULAR | Status: AC
  Filled 2021-09-17: qty 1

## 2021-09-17 SURGICAL SUPPLY — 17 items
CATH 8FR REPROCESSED SOUNDSTAR (CATHETERS) ×3 IMPLANT
CATH OCTARAY 2.0 F 3-3-3-3-3 (CATHETERS) ×3 IMPLANT
CATH S CIRCA THERM PROBE 10F (CATHETERS) ×3 IMPLANT
CATH SMTCH THERMOCOOL SF DF (CATHETERS) ×3 IMPLANT
CATH WEB BI DIR CSDF CRV REPRO (CATHETERS) ×3 IMPLANT
CLOSURE PERCLOSE PROSTYLE (VASCULAR PRODUCTS) ×9 IMPLANT
COVER SWIFTLINK CONNECTOR (BAG) ×3 IMPLANT
PACK EP LATEX FREE (CUSTOM PROCEDURE TRAY) ×3
PACK EP LF (CUSTOM PROCEDURE TRAY) ×1 IMPLANT
PAD PRO RADIOLUCENT 2001M-C (PAD) ×3 IMPLANT
PATCH CARTO3 (PAD) ×3 IMPLANT
SHEATH BAYLIS TRANSSEPTAL 98CM (NEEDLE) ×3 IMPLANT
SHEATH CARTO VIZIGO SM CVD (SHEATH) ×3 IMPLANT
SHEATH PINNACLE 8F 10CM (SHEATH) ×6 IMPLANT
SHEATH PINNACLE 9F 10CM (SHEATH) ×3 IMPLANT
SHEATH PROBE COVER 6X72 (BAG) ×3 IMPLANT
TUBING SMART ABLATE COOLFLOW (TUBING) ×3 IMPLANT

## 2021-09-17 NOTE — Discharge Instructions (Addendum)
Post procedure care instructions No driving for 4 days. No lifting over 5 lbs for 1 week. No vigorous or sexual activity for 1 week. You may return to work/your usual activities on 09/25/21. Keep procedure site clean & dry. If you notice increased pain, swelling, bleeding or pus, call/return!  You may shower after 24 hours, but no soaking in baths/hot tubs/pools for 1 week.     Cardiac Ablation, Care After  This sheet gives you information about how to care for yourself after your procedure. Your health care provider may also give you more specific instructions. If you have problems or questions, contact your health care provider. What can I expect after the procedure? After the procedure, it is common to have: Bruising around your puncture site. Tenderness around your puncture site. Skipped heartbeats. Tiredness (fatigue).  Follow these instructions at home: Puncture site care  Follow instructions from your health care provider about how to take care of your puncture site. Make sure you: If present, leave stitches (sutures), skin glue, or adhesive strips in place. These skin closures may need to stay in place for up to 2 weeks. If adhesive strip edges start to loosen and curl up, you may trim the loose edges. Do not remove adhesive strips completely unless your health care provider tells you to do that. If a large square bandage is present, this may be removed 24 hours after surgery.  Check your puncture site every day for signs of infection. Check for: Redness, swelling, or pain. Fluid or blood. If your puncture site starts to bleed, lie down on your back, apply firm pressure to the area, and contact your health care provider. Warmth. Pus or a bad smell. Driving Do not drive for at least 4 days after your procedure or however long your health care provider recommends. (Do not resume driving if you have previously been instructed not to drive for other health reasons.) Do not drive or use  heavy machinery while taking prescription pain medicine. Activity Avoid activities that take a lot of effort for at least 7 days after your procedure. Do not lift anything that is heavier than 5 lb (4.5 kg) for one week.  No sexual activity for 1 week.  Return to your normal activities as told by your health care provider. Ask your health care provider what activities are safe for you. General instructions Take over-the-counter and prescription medicines only as told by your health care provider. Do not use any products that contain nicotine or tobacco, such as cigarettes and e-cigarettes. If you need help quitting, ask your health care provider. You may shower after 24 hours, but Do not take baths, swim, or use a hot tub for 1 week.  Do not drink alcohol for 24 hours after your procedure. Keep all follow-up visits as told by your health care provider. This is important. Contact a health care provider if: You have redness, mild swelling, or pain around your puncture site. You have fluid or blood coming from your puncture site that stops after applying firm pressure to the area. Your puncture site feels warm to the touch. You have pus or a bad smell coming from your puncture site. You have a fever. You have chest pain or discomfort that spreads to your neck, jaw, or arm. You are sweating a lot. You feel nauseous. You have a fast or irregular heartbeat. You have shortness of breath. You are dizzy or light-headed and feel the need to lie down. You have pain or  numbness in the arm or leg closest to your puncture site. Get help right away if: Your puncture site suddenly swells. Your puncture site is bleeding and the bleeding does not stop after applying firm pressure to the area. These symptoms may represent a serious problem that is an emergency. Do not wait to see if the symptoms will go away. Get medical help right away. Call your local emergency services (911 in the [place] Do not drive  yourself to the hospital. Summary After the procedure, it is normal to have bruising and tenderness at the puncture site in your groin, neck, or forearm. Check your puncture site every day for signs of infection. Get help right away if your puncture site is bleeding and the bleeding does not stop after applying firm pressure to the area. This is a medical emergency. This information is not intended to replace advice given to you by your health care provider. Make sure you discuss any questions you have with your health care provider.  You have an appointment set up with the [facility].  Multiple studies have shown that being followed by a dedicated atrial fibrillation clinic in addition to the standard care you receive from your other physicians improves health. We believe that enrollment in the atrial fibrillation clinic will allow [place] to better care for you.   The phone number to the [facility] is [phone]. The clinic is staffed Monday through Friday from 8:30am to 5pm.  Parking Directions: The clinic is located in the Heart and Vascular Building connected to [facility]. 1)From [address] turn on to [address] and go to the 3rd entrance  (Heart and Vascular entrance) on the right. 2)Look to the right for Heart &Vascular Parking Garage. 3)A code for the entrance is required, for Nov is 4444.   4)Take the elevators to the 1st floor. Registration is in the room with the glass walls at the end of the hallway.  If you have any trouble parking or locating the clinic, please don't hesitate to call [phone].

## 2021-09-17 NOTE — Transfer of Care (Signed)
Immediate Anesthesia Transfer of Care Note  Patient: Kristin Lowe  Procedure(s) Performed: ATRIAL FIBRILLATION ABLATION  Patient Location: Cath Lab  Anesthesia Type:General  Level of Consciousness: drowsy  Airway & Oxygen Therapy: Patient Spontanous Breathing and Patient connected to nasal cannula oxygen  Post-op Assessment: Report given to RN and Post -op Vital signs reviewed and stable  Post vital signs: Reviewed and stable  Last Vitals:  Vitals Value Taken Time  BP 118/45 09/17/21 1007  Temp    Pulse 67 09/17/21 1007  Resp 18 09/17/21 1007  SpO2 99 % 09/17/21 1007  Vitals shown include unvalidated device data.  Last Pain:  Vitals:   09/17/21 0558  TempSrc:   PainSc: 0-No pain         Complications: No notable events documented.

## 2021-09-17 NOTE — Anesthesia Procedure Notes (Signed)
Procedure Name: Intubation Date/Time: 09/17/2021 7:36 AM Performed by: Colin Benton, CRNA Pre-anesthesia Checklist: Patient identified, Emergency Drugs available, Suction available and Patient being monitored Patient Re-evaluated:Patient Re-evaluated prior to induction Oxygen Delivery Method: Circle system utilized Preoxygenation: Pre-oxygenation with 100% oxygen Induction Type: IV induction Ventilation: Mask ventilation without difficulty Laryngoscope Size: Mac and 3 Grade View: Grade II Tube type: Oral Tube size: 7.0 mm Number of attempts: 1 Airway Equipment and Method: Stylet Placement Confirmation: ETT inserted through vocal cords under direct vision, positive ETCO2 and breath sounds checked- equal and bilateral Secured at: 22 cm Tube secured with: Tape Dental Injury: Teeth and Oropharynx as per pre-operative assessment

## 2021-09-17 NOTE — H&P (Signed)
Electrophysiology Office Note:     Date:  08/13/2021    ID:  Kristin Lowe, DOB 10/20/1941, MRN [id number]   PCP:  [name], MD       [facility] Cardiologist:  Buford Dresser, MD  [facility] Electrophysiologist:  Virl Axe, MD    Referring MD: [name], MD    Chief Complaint: Atrial fibrillation   History of Present Illness:     Kristin Lowe is a 80 y.o. female who presents for an evaluation of atrial fibrillation at the request of Dr. [name] and Oda Kilts, PA-C. Their medical history includes stroke, paroxysmal atrial fibrillation, hypertension.  The patient was last seen by Oda Kilts on July 30, 2021.  At that appointment she reported not being very active because of concern that it would trigger episodes of atrial fibrillation.  She is not interested in taking medications.  She is interested in pursuing ablation and watchman to assist in the management of her atrial fibrillation and her desire to avoid long-term anticoagulation.   She follows her atrial fibrillation burden on her apple watch.  There was increased burden in the 5 to 7 days prior to her appointment with Oda Kilts.   She is with her son today in clinic.       Past Medical History:  Diagnosis Date   Bradycardia     Hypertension     Paroxysmal atrial fibrillation (HCC)     Stroke (cerebrum) [facility])             Past Surgical History:  Procedure Laterality Date   CATARACT EXTRACTION, BILATERAL       LOOP RECORDER INSERTION N/A 05/17/2018    Procedure: LOOP RECORDER INSERTION;  Surgeon: Deboraha Sprang, MD;  Location: [place] [facility];  Service: Cardiovascular;  Laterality: N/A;   LOOP RECORDER INSERTION   05/17/2018    Procedure: LOOP RECORDER INSERTION;  Surgeon: Fay Records, MD;  Location: [place];  Service: Cardiovascular;;   TEE WITHOUT CARDIOVERSION N/A 05/17/2018    Procedure: TRANSESOPHAGEAL ECHOCARDIOGRAM (TEE);  Surgeon: Fay Records, MD;  Location: [facility]  ENDOSCOPY;  Service: Cardiovascular;  Laterality: N/A;   TRIGGER FINGER RELEASE       TUMOR REMOVAL          Current Medications: Active Medications      Current Meds  Medication Sig   apixaban (ELIQUIS) 5 MG TABS tablet Take 1 tablet (5 mg total) by mouth 2 (two) times daily. Overdue for labwork, MUST have repeat labwork at office visit prior to FUTURE refills.   atorvastatin (LIPITOR) 40 MG tablet Take 1 tablet (40 mg total) by mouth daily at 6 PM.   azelastine (ASTELIN) 0.1 % nasal spray Place 2 sprays into the nose 2 (two) times daily.   Cholecalciferol (VITAMIN D) 2000 units CAPS Take 2,000 Units by mouth daily.    clobetasol ointment (TEMOVATE) 0.05 % as needed.   co-enzyme Q-10 50 MG capsule Take 100 mg by mouth daily.   levOCARNitine (CARNITINE, L,) POWD 1 tablet by Does not apply route daily.   Magnesium 250 MG TABS Take 250 mg by mouth daily.   Multiple Vitamins-Calcium (ADVANCED AM/PM PO) Take 1 tablet by mouth daily.   Multiple Vitamins-Minerals (IMMUNE SUPPORT PO) Take 1 tablet by mouth daily.   Omega-3 1000 MG CAPS Take 1 capsule by mouth daily.   Triamcinolone Acetonide (NASACORT AQ NA) Place 1 spray into both nostrils daily as needed (allergies).    TURMERIC  PO Take 1 capsule by mouth daily.   verapamil (CALAN) 40 MG tablet Take 1 tablet (40 mg total) by mouth daily.        Allergies:   Patient has no known allergies.    Social History         Socioeconomic History   Marital status: Single      Spouse name: Not on file   Number of children: Not on file   Years of education: Not on file   Highest education level: Not on file  Occupational History   Not on file  Tobacco Use   Smoking status: Former   Smokeless tobacco: Never  Vaping Use   Vaping Use: Never used  Substance and Sexual Activity   Alcohol use: No   Drug use: Never   Sexual activity: Not on file  Other Topics Concern   Not on file  Social History Narrative   Not on file    Social  Determinants of Health    Financial Resource Strain: Not on file  Food Insecurity: Not on file  Transportation Needs: Not on file  Physical Activity: Not on file  Stress: Not on file  Social Connections: Not on file      Family History: The patient's family history includes Breast cancer in her mother; Diabetes in her brother and mother; Heart disease in her father; Hypertension in her brother and sister; Thyroid disease in her mother and sister.   ROS:   Please see the history of present illness.    All other systems reviewed and are negative.   EKGs/Labs/Other Studies Reviewed:     The following studies were reviewed today:   August 02, 2021 echo personally reviewed Left ventricular function normal, 65% Right ventricular function normal No significant valvular abnormalities Left atrial size was normal   June 25, 2021 loop interrogation shows an 11-hour episode of atrial fibrillation May 23, 2021 loop interrogation shows a 29-hour episode of atrial fibrillation   May 29, 2021 EKG shows sinus rhythm   EKG:  The ekg ordered today demonstrates sinus rhythm.   Recent Labs: 07/12/2021: ALT 19; BUN 14; Creatinine, Ser 0.81; Hemoglobin 13.4; Platelets 278; Potassium 4.4; Sodium 141; TSH 0.564  Recent Lipid Panel Labs (Brief)          Component Value Date/Time    CHOL 182 05/15/2018 2119    TRIG 115 05/15/2018 2119    HDL 53 05/15/2018 2119    CHOLHDL 3.4 05/15/2018 2119    VLDL 23 05/15/2018 2119    LDLCALC 106 (H) 05/15/2018 2119        Physical Exam:     VS:  BP (!) 142/78   Pulse 72   Ht 5' 6.5" (1.689 m)   Wt 167 lb 12.8 oz (76.1 kg)   SpO2 98%   BMI 26.68 kg/m         Wt Readings from Last 3 Encounters:  08/13/21 167 lb 12.8 oz (76.1 kg)  07/30/21 167 lb (75.8 kg)  07/12/21 167 lb 6.4 oz (75.9 kg)      GEN: Well nourished, well developed in no acute distress.  Appears younger than stated age. HEENT: Normal NECK: No JVD; No carotid  bruits LYMPHATICS: No lymphadenopathy CARDIAC: RRR, no murmurs, rubs, gallops RESPIRATORY:  Clear to auscultation without rales, wheezing or rhonchi  ABDOMEN: Soft, non-tender, non-distended MUSCULOSKELETAL:  No edema; No deformity  SKIN: Warm and dry NEUROLOGIC:  Alert and oriented x 3 PSYCHIATRIC:  Normal affect  ASSESSMENT:     1. Paroxysmal atrial fibrillation (HCC)   2. History of CVA (cerebrovascular accident)   3. Essential hypertension   4. TIA (transient ischemic attack)   5. Sinus bradycardia     PLAN:     In order of problems listed above:   1. Paroxysmal atrial fibrillation (HCC) 2. History of CVA (cerebrovascular accident)   The patient has symptomatic paroxysmal atrial fibrillation.  She is maintained on Eliquis for stroke prophylaxis.  She is interested in a rhythm control strategy that avoids long-term exposure to drug therapy.  She has trouble with symptomatic bradycardia with some medications which also is contributing to her desire to pursue ablation.  I think she is overall an excellent candidate for ablation.  We discussed the procedure in detail with the patient and her son during today's visit including the risks, recovery and efficacy.  She would like to proceed with scheduling.   We discussed using the ablation procedure in conjunction with a watchman implant to help avoid long-term exposure anticoagulation while still providing protection against stroke.  I discussed the procedure in detail with the patient.  She would like to schedule these in sequence.   We will need a CT scan prior to the PVI which also serve as the prewatchman planning evaluation.   Risk, benefits, and alternatives to EP study and radiofrequency ablation for afib were also discussed in detail today. These risks include but are not limited to stroke, bleeding, vascular damage, tamponade, perforation, damage to the esophagus, lungs, and other structures, pulmonary vein stenosis, worsening  renal function, and death. The patient understands these risk and wishes to proceed.  We will therefore proceed with catheter ablation at the next available time.  Carto, ICE, anesthesia are requested for the procedure.  Will also obtain CT PV protocol prior to the procedure to exclude LAA thrombus and further evaluate atrial anatomy.   ----------------------------   I have seen Kristin Lowe in the office today who is being considered for a Watchman left atrial appendage closure device. I believe they will benefit from this procedure given their history of atrial fibrillation, CHA2DS2-VASc score of at least 5 and unadjusted ischemic stroke rate of 7.2% per year. The patient would like to avoid long term exposure to anticoagulation. The patient's chart has been reviewed and I feel that they would be a candidate for short term oral anticoagulation after Watchman implant.    It is my belief that after undergoing a LAA closure procedure, Kristin Lowe will not need long term anticoagulation which eliminates anticoagulation side effects and major bleeding risk.    Procedural risks for the Watchman implant have been reviewed with the patient including a 0.5% risk of stroke, <1% risk of perforation and <1% risk of device embolization.      The published clinical data on the safety and effectiveness of WATCHMAN include but are not limited to the following: - Holmes DR, Mechele Claude, Sick P et al. for the PROTECT AF Investigators. Percutaneous closure of the left atrial appendage versus warfarin therapy for prevention of stroke in patients with atrial fibrillation: a randomised non-inferiority trial. Lancet 2009; 374: 534-42. Mechele Claude, Doshi SK, Abelardo Diesel D et al. on behalf of the PROTECT AF Investigators. Percutaneous Left Atrial Appendage Closure for Stroke Prophylaxis in Patients With Atrial Fibrillation 2.3-Year Follow-up of the PROTECT AF (Watchman Left Atrial Appendage System for Embolic  Protection in Patients With Atrial Fibrillation) Trial. Circulation 2013; 127:720-729. - Alli  Azucena Kuba,  Kar S, Reddy Hunter, Sievert H et al. Quality of Life Assessment in the Randomized PROTECT AF (Percutaneous Closure of the Left Atrial Appendage Versus Warfarin Therapy for Prevention of Stroke in Patients With Atrial Fibrillation) Trial of Patients at Risk for Stroke With Nonvalvular Atrial Fibrillation. J Am Coll Cardiol 2013; 41:7408-1. Vertell Limber DR, Tarri Abernethy, Price M, Elbert, Sievert H, Doshi S, Huber K, Reddy V. Prospective randomized evaluation of the Watchman left atrial appendage Device in patients with atrial fibrillation versus long-term warfarin therapy; the PREVAIL trial. Journal of the [employer] of Cardiology, Vol. 4, No. 1, 2014, 1-11. - Kar S, Doshi SK, Sadhu A, Horton R, Osorio J et al. Primary outcome evaluation of a next-generation left atrial appendage closure device: results from the PINNACLE FLX trial. Circulation 2021;143(18)1754-1762.    After today's visit with the patient which was dedicated solely for shared decision making visit regarding LAA closure device, the patient decided to proceed with the LAA appendage closure procedure scheduled to be done in the near future at [facility].     3. Essential hypertension Controlled. Continue current regimen.   4. Sinus bradycardia Limits use of antiarrhythmic drug therapy.               Total time spent with patient today 45 minutes. This includes reviewing records, evaluating the patient and coordinating care.     ------------------------------------------------- I have seen, examined the patient, and reviewed the above assessment and plan.    Plan for PVI today.   Vickie Epley, MD 09/17/2021 7:18 AM

## 2021-09-17 NOTE — Anesthesia Postprocedure Evaluation (Signed)
Anesthesia Post Note  Patient: Kristin Lowe  Procedure(s) Performed: ATRIAL FIBRILLATION ABLATION     Patient location during evaluation: Cath Lab Anesthesia Type: General Level of consciousness: awake and alert Pain management: pain level controlled Vital Signs Assessment: post-procedure vital signs reviewed and stable Respiratory status: spontaneous breathing, nonlabored ventilation, respiratory function stable and patient connected to nasal cannula oxygen Cardiovascular status: blood pressure returned to baseline and stable Postop Assessment: no apparent nausea or vomiting Anesthetic complications: no   No notable events documented.  Last Vitals:  Vitals:   09/17/21 1215 09/17/21 1230  BP: (!) 115/52 (!) 120/59  Pulse: 63 67  Resp: 17 18  Temp:    SpO2: 95% 99%    Last Pain:  Vitals:   09/17/21 1135  TempSrc:   PainSc: 0-No pain                 Catalina Gravel

## 2021-09-18 ENCOUNTER — Encounter (HOSPITAL_COMMUNITY): Payer: Self-pay | Admitting: Cardiology

## 2021-09-19 NOTE — Telephone Encounter (Signed)
Returned call to Pt.  Advised Protonix prescribed by Dr. Melvyn Novas was same as what we would prescribe.  Pt indicates understanding.  All questions answered.

## 2021-09-23 ENCOUNTER — Telehealth (HOSPITAL_COMMUNITY): Payer: Self-pay | Admitting: Nurse Practitioner

## 2021-09-23 ENCOUNTER — Telehealth: Payer: Self-pay | Admitting: Gastroenterology

## 2021-09-23 ENCOUNTER — Encounter: Payer: Self-pay | Admitting: Gastroenterology

## 2021-09-23 ENCOUNTER — Ambulatory Visit (INDEPENDENT_AMBULATORY_CARE_PROVIDER_SITE_OTHER): Payer: Medicare Other | Admitting: Gastroenterology

## 2021-09-23 VITALS — BP 126/78 | HR 69 | Ht 66.0 in | Wt 167.0 lb

## 2021-09-23 DIAGNOSIS — R053 Chronic cough: Secondary | ICD-10-CM

## 2021-09-23 DIAGNOSIS — R194 Change in bowel habit: Secondary | ICD-10-CM

## 2021-09-23 DIAGNOSIS — K219 Gastro-esophageal reflux disease without esophagitis: Secondary | ICD-10-CM

## 2021-09-23 NOTE — Telephone Encounter (Signed)
Called pt back about pt message re Left groin area. She feels a lump around 2 inches in left groin.  Some bruising around area. Feels some discomfort  in groin when sitting but not in leg. I will see tomorrow for groin check. It is not bleeding or hot feeling.

## 2021-09-23 NOTE — Progress Notes (Signed)
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  Discussed lifestyle modifications to manage constipation. Consider pelvic floor dysfunction if colonoscopy is negative.  I have recommended holding Eliquis for 2 days before endoscopy.  I discussed with the patient that there is a low, but real, risk of a cardiovascular event such as heart attack, stroke, or embolism/thrombosis while off Eliquis. Will communicate by phone or EMR with patient's prescribing provider to confirm that holding the Eliquis is appropriate at this time.    PLAN: - Add a daily stool bulking agent such as Metamucil or Benefiber - Use Miralax 17 daily in addition to Metamucil - Drink at least 64 ounces of water daily, follow a high fiber diet - Colonoscopy after an Eliquis washout - EGD to evaluate for reflux - Will attempt to obtain prior colonoscopy report from [facility]  HPI: Kristin Lowe is a 80 y.o. female referred by Dr. Joylene Draft.  The history is obtained through the patient, review of her electronic health record, and records provided by Dr. Joylene Draft.  She has a history of allergic rhinitis, sinus bradycardia, diverticulosis on colonoscopy, thyroid nodules with negative biopsies, osteopenia, sleep apnea, TIA 3 years ago, nephrolithiasis, and paroxysmal atrial fibrillation on Eliquis s/p ablation 09/17/21. Moved from [facility] and has been establishing with local physicians.   Prior colonoscopy in [place]. She does not remember the results beyond diverticulosis  including the name of the performing physician or location of the procedure. She does not remember having colon polyps.   She presents today reporting a 1 year history of constipation.  Lifetime history of early morning BM. After a two week visit to her brother's house, where they ate later in the day, she has had a change in her bowel habits and now has associated abdominal bloating. She is having small, lumpy bowel movements every other day. There is a sense of incomplete evacuation. There is no history of straining, use of digital maneuvers, sensation of anorectal obstruction or blockage with 25 percent of bowel movements. Her bowel habits never returned to normal.  Recently seen by Dr. Melvyn Novas for chronic cough that he thought might be related to reflux. He prescribed two medications including pantoprazole 40 mg daily and famotidine 20 mg with dinner. She wonders if she needs to have an upper endoscopy.   Eating prunes has not provided any relief. She felt like her diet was already high fiber with lots of fruits, vegetables, or nuts. She tried taking Metamucil but not for many days. She has not used any prescription medications for constipation.   She walks 10,000 daily. But, is frustrated by unintentional weight gain.   Labs 08/29/2020 showing normal CBC and CMP.  Calcium loads 10.3.  TSH 0.41.  No prior abdominal imaging.   Past Medical History:  Diagnosis Date   Bradycardia    Hypertension    Paroxysmal atrial fibrillation (HCC)    Stroke (cerebrum) [facility])     Past Surgical History:  Procedure Laterality Date   ATRIAL FIBRILLATION ABLATION  N/A 09/17/2021   Procedure: ATRIAL FIBRILLATION ABLATION;  Surgeon: Vickie Epley, MD;  Location: [place] [facility];  Service: Cardiovascular;  Laterality: N/A;   CATARACT EXTRACTION, BILATERAL     LOOP RECORDER INSERTION N/A 05/17/2018   Procedure: LOOP RECORDER INSERTION;  Surgeon: Deboraha Sprang, MD;  Location: [place] [facility];  Service:  Cardiovascular;  Laterality: N/A;   LOOP RECORDER INSERTION  05/17/2018   Procedure: LOOP RECORDER INSERTION;  Surgeon: Fay Records, MD;  Location: [place];  Service: Cardiovascular;;   TEE WITHOUT CARDIOVERSION N/A 05/17/2018   Procedure: TRANSESOPHAGEAL ECHOCARDIOGRAM (TEE);  Surgeon: Fay Records, MD;  Location: [facility] ENDOSCOPY;  Service: Cardiovascular;  Laterality: N/A;   TRIGGER FINGER RELEASE     TUMOR REMOVAL       Current Outpatient Medications  Medication Sig Dispense Refill   acetaminophen (TYLENOL) 325 MG tablet Take 650 mg by mouth every 6 (six) hours as needed for moderate pain or fever.     apixaban (ELIQUIS) 5 MG TABS tablet Take 1 tablet (5 mg total) by mouth 2 (two) times daily. Overdue for labwork, MUST have repeat labwork at office visit prior to FUTURE refills. 180 tablet 0   atorvastatin (LIPITOR) 40 MG tablet Take 1 tablet (40 mg total) by mouth daily at 6 PM. 30 tablet 3   cholecalciferol (VITAMIN D3) 25 MCG (1000 UNIT) tablet Take 1,000 Units by mouth daily.     clobetasol ointment (TEMOVATE) 9.93 % Apply 1 application topically daily as needed (skin irritation).     co-enzyme Q-10 50 MG capsule Take 100 mg by mouth daily.     DHA-EPA-Vitamin E (OMEGA-3 COMPLEX PO) Take 1 capsule by mouth at bedtime.     famotidine (PEPCID) 20 MG tablet One after supper (Patient taking differently: Take 20 mg by mouth daily with supper.) 30 tablet 11   Multiple Vitamins-Minerals (IMMUNE SUPPORT PO) Take 7 tablets by mouth at bedtime. Id Life  PM Package Omega 3 complex Vit d3 plus cofactors Advance pm Axitant plus     OVER THE COUNTER MEDICATION Take 1 Package by mouth daily. Multivitamin D life Vitamin pack (morning Pack)  Spirulina/chlorella Advance AM     pantoprazole (PROTONIX) 40 MG tablet Take 1 tablet (40 mg total) by mouth daily. Take 30-60 min before first meal of the day 30 tablet 2   Triamcinolone Acetonide (NASACORT AQ NA) Place 1 spray into both nostrils  daily as needed (allergies).      Turmeric 500 MG CAPS Take 1,000 mg by mouth daily.     verapamil (CALAN) 40 MG tablet Take 1 tablet (40 mg total) by mouth daily. 90 tablet 3   No current facility-administered medications for this visit.    Allergies as of 09/23/2021   (No Known Allergies)    Family History  Problem Relation Age of Onset   Breast cancer Mother    Diabetes Mother    Thyroid disease Mother    Heart disease Father    Hypertension Sister    Thyroid disease Sister    Diabetes Brother    Hypertension Brother      Review of Systems: 12 system ROS is negative except as noted above with the addition of sinus trouble, cough, fatigue, swelling, and some urinary leakage.   Physical Exam: General:   Alert,  well-nourished, pleasant and cooperative in NAD Head:  Normocephalic and atraumatic. Eyes:  Sclera clear, no icterus.   Conjunctiva pink. Ears:  Normal auditory acuity. Nose:  No deformity, discharge,  or lesions. Mouth:  No deformity or lesions.   Neck:  Supple; no masses or thyromegaly. Lungs:  Clear throughout to auscultation.   No wheezes. Heart:  Regular rate and rhythm; no murmurs. Abdomen:  Soft, nontender, nondistended, normal bowel sounds, no rebound or guarding. No hepatosplenomegaly.   Rectal:  Deferred  Msk:  Symmetrical. No boney deformities LAD: No inguinal or umbilical LAD Extremities:  No clubbing or edema. Neurologic:  Alert and  oriented x4;  grossly nonfocal Skin:  Intact without significant lesions or rashes. Psych:  Alert and cooperative. Normal mood and affect.    Countess Biebel L. Tarri Glenn, MD, MPH 09/23/2021, 8:55 PM

## 2021-09-23 NOTE — Patient Instructions (Addendum)
It was my pleasure to provide care to you today. Based on our discussion, I am providing you with my recommendations below:  RECOMMENDATION(S):   I recommend that you eat at least 25-30 grams of fiber daily and drink at least 64 ounces of water daily. You will want to gradually increase the fiber in your diet to avoid bloating. You may increase the fiber through diet and through fiber supplements including psyllium and methycellulose. We specifically discussed using Benefiber every day.   Natural laxatives include prunes, apples, apricots, cherries, peaches, pears, aloe, rhubarb, kiwi, bananas, mango, papaya, and watermelon. In particular, two kiwi a day has been show to cause less likely to cause bloating than prunes or psyllium.  If you are still feeling constipated, you could add a daily dose of Miralax 17 grams daily.   I have recommended an upper endoscopy to evaluate for reflux and a colonoscopy to evaluate her change in bowel habits.   COLONOSCOPY:   You have been scheduled for a colonoscopy. Please follow written instructions given to you at your visit today.   PREP:   Please pick up your prep supplies at the pharmacy within the next 1-3 days.  INHALERS:   If you use inhalers (even only as needed), please bring them with you on the day of your procedure.  MEDICATIONS TO HOLD:  We will contact your provider to request permission for you to hold Eliquis. Once we receive a response, you will be contacted by our office. If you do not hear from our office 1 week prior to your scheduled procedure, please contact our office at [phone].   COLONOSCOPY TIPS:  To reduce nausea and dehydration, stay well hydrated for 3-4 days prior to the exam.  To prevent skin/hemorrhoid irritation - prior to wiping, put A&Dointment or vaseline on the toilet paper. Keep a towel or pad on the bed.  BEFORE STARTING YOUR PREP, drink  64oz of clear liquids in the morning. This will help to flush the  colon and will ensure you are well hydrated!!!!  NOTE - This is in addition to the fluids required for to complete your prep. Use of a flavored hard candy, such as grape Anise Salvo, can counteract some of the flavor of the prep and may prevent some nausea.   FOLLOW UP:  After your procedure, you will receive a call from my office staff regarding my recommendation for follow up.  BMI:  If you are age 48 or older, your body mass index should be between 23-30. Your Body mass index is 26.95 kg/m. If this is out of the aforementioned range listed, please consider follow up with your Primary Care Provider.  If you are age 26 or younger, your body mass index should be between 19-25. Your Body mass index is 26.95 kg/m. If this is out of the aformentioned range listed, please consider follow up with your Primary Care Provider.   MY CHART:  The [place] [facility] providers would like to encourage you to use [facility] to communicate with providers for non-urgent requests or questions.  Due to long hold times on the telephone, sending your provider a message by [facility] may be a faster and more efficient way to get a response.  Please allow 48 business hours for a response.  Please remember that this is for non-urgent requests.   Thank you for trusting me with your gastrointestinal care!    Thornton Park, MD, MPH

## 2021-09-24 ENCOUNTER — Ambulatory Visit (HOSPITAL_COMMUNITY)
Admission: RE | Admit: 2021-09-24 | Discharge: 2021-09-24 | Disposition: A | Discharge status: Home / Self Care | Payer: Medicare Other | Source: Ambulatory Visit | Attending: Nurse Practitioner | Admitting: Nurse Practitioner

## 2021-09-24 ENCOUNTER — Ambulatory Visit (HOSPITAL_BASED_OUTPATIENT_CLINIC_OR_DEPARTMENT_OTHER)
Admission: RE | Admit: 2021-09-24 | Discharge: 2021-09-24 | Disposition: A | Discharge status: Home / Self Care | Payer: Medicare Other | Source: Ambulatory Visit | Attending: Nurse Practitioner | Admitting: Nurse Practitioner

## 2021-09-24 ENCOUNTER — Other Ambulatory Visit: Payer: Self-pay

## 2021-09-24 DIAGNOSIS — R1032 Left lower quadrant pain: Secondary | ICD-10-CM

## 2021-09-24 DIAGNOSIS — M7981 Nontraumatic hematoma of soft tissue: Secondary | ICD-10-CM | POA: Insufficient documentation

## 2021-09-24 NOTE — Progress Notes (Addendum)
Pt in for groin check of left leg after afib ablation 10/25.  She had noted a hardness of left groin that was tender on sitting. Did not note any pain in leg with walking. Is taking her anticoagulation appropriately. Noted this to come up after being home for a couple of days. Has not been overdoing walking or taking steps.   I do feel a roughly 2 in long area of induration just superior to groin fold that is approximately one inch wide. Some bruising left leg but appears old. No area of redness or warmth. No bruit heard but I do not feel a pulse in left foot where a pulse in present rt foot. Color /temp of leg/foot  is normal. To U/S department now  to assess for pseudoaneurysm of left groin.   Addendum- 09/25/21- U/S of left groin showed small hematoma. Pt reassured, no vascular issues. Can use intermittent heat to the area. I will see back in 3 weeks. Call if issues before then.

## 2021-09-25 NOTE — Addendum Note (Signed)
Encounter addended by: Sherran Needs, NP on: 09/25/2021 8:29 AM  Actions taken: Clinical Note Signed

## 2021-09-30 ENCOUNTER — Encounter (HOSPITAL_COMMUNITY): Payer: Self-pay

## 2021-09-30 ENCOUNTER — Telehealth: Payer: Self-pay | Admitting: *Deleted

## 2021-10-02 ENCOUNTER — Encounter: Payer: Medicare Other | Admitting: Obstetrics & Gynecology

## 2021-10-02 LAB — CUP PACEART REMOTE DEVICE CHECK
Date Time Interrogation Session: 20221109012159
Implantable Pulse Generator Implant Date: 20190624

## 2021-10-04 NOTE — Telephone Encounter (Signed)
Patient had A Fib ablation on 09/17/21.  Needs to be on anticoagulation for 90 days post procedure. Therefore can not hold Eliquis for 2 days for colonoscopy.  If physician is not comfortable performing procedure on anticoagulation, will need to be rescheduled.

## 2021-10-04 NOTE — Telephone Encounter (Signed)
[facility] Pre-operative Risk Assessment     Request for surgical clearance:     Endoscopy Procedure  What type of surgery is being performed?     Endoscopy/Colonoscopy  When is this surgery scheduled?     12/02/21  What type of clearance is required ?   Pharmacy  Are there any medications that need to be held prior to surgery and how long? Eliquis 2 days  Practice name and name of physician performing surgery?      [facility]  What is your office phone and fax number?      Phone- [phone]  [phone]  Anesthesia type (None, local, MAC, general) ?       MAC

## 2021-10-06 ENCOUNTER — Encounter (HOSPITAL_BASED_OUTPATIENT_CLINIC_OR_DEPARTMENT_OTHER): Payer: Self-pay | Admitting: Cardiovascular Disease

## 2021-10-06 NOTE — Procedures (Signed)
Patient Name: Kristin Lowe, Kristin Lowe Date: 09/09/2021 Gender: Female D.O.B: December 28, 1940 Age (years): 80 Referring Provider: Virl Axe Height (inches): 66 Interpreting Physician: Shelva Majestic MD, ABSM Weight (lbs): 165 RPSGT: Carolin Coy BMI: 27 MRN: [id number] Neck Size: 12.50 <br> <br> CLINICAL INFORMATION The patient is referred for a BiPAP titration to treat sleep apnea.    Date of HST:  07/09/2021:  AHI 20.3/h; supine AHI 35.7/h; O2 nadir 90%.  SLEEP STUDY TECHNIQUE As per the AASM Manual for the Scoring of Sleep and Associated Events v2.3 (April 2016) with a hypopnea requiring 4% desaturations.  The channels recorded and monitored were frontal, central and occipital EEG, electrooculogram (EOG), submentalis EMG (chin), nasal and oral airflow, thoracic and abdominal wall motion, anterior tibialis EMG, snore microphone, electrocardiogram, and pulse oximetry. Bilevel positive airway pressure (BPAP) was initiated at the beginning of the study and titrated to treat sleep-disordered breathing.  MEDICATIONS acetaminophen (TYLENOL) 325 MG tablet apixaban (ELIQUIS) 5 MG TABS tablet atorvastatin (LIPITOR) 40 MG tablet cholecalciferol (VITAMIN D3) 25 MCG (1000 UNIT) tablet clobetasol ointment (TEMOVATE) 0.05 % co-enzyme Q-10 50 MG capsule DHA-EPA-Vitamin E (OMEGA-3 COMPLEX PO) famotidine (PEPCID) 20 MG tablet Multiple Vitamins-Minerals (IMMUNE SUPPORT PO) OVER THE COUNTER MEDICATION pantoprazole (PROTONIX) 40 MG tablet Triamcinolone Acetonide (NASACORT AQ NA) Turmeric 500 MG CAPS verapamil (CALAN) 40 MG tablet  Medications self-administered by patient taken the night of the study : N/A  RESPIRATORY PARAMETERS Optimal IPAP Pressure (cm): 22 AHI at Optimal Pressure (/hr) 2.5 Optimal EPAP Pressure (cm): 18   Overall Minimal O2 (%): 92.0 Minimal O2 at Optimal Pressure (%): 95.0  SLEEP ARCHITECTURE Start Time: 10:26:53 PM Stop Time: 5:03:10 AM Total Time  (min): 396.3 Total Sleep Time (min): 315 Sleep Latency (min): 9.6 Sleep Efficiency (%): 79.5% REM Latency (min): 75.0 WASO (min): 71.7 Stage N1 (%): 13.2% Stage N2 (%): 65.4% Stage N3 (%): 0.0% Stage R (%): 21.4 Supine (%): 71.75 Arousal Index (/hr): 38.3   CARDIAC DATA The 2 lead EKG demonstrated sinus rhythm. The mean heart rate was 46.5 beats per minute. Other EKG findings include: PVCs.  LEG MOVEMENT DATA The total Periodic Limb Movements of Sleep (PLMS) were 0. The PLMS index was 0.0. A PLMS index of <15 is considered normal in adults.  IMPRESSIONS - CPAP was initiated at 5 cm, titrated to 17 cm and transitioned to BiPAP up to 22/18 cm of water (AHI 2.5/ RDI 3.3, O2 nadir of 95.0%.   - Central sleep apnea was not noted during this titration (CAI = 0.8/h). - Significant oxygen desaturations were not observed during this titration (min O2 9 2.0%). - The patient snored with moderate snoring volume. - 2-lead EKG demonstrated: PVCs - Clinically significant periodic limb movements were not noted during this study. Arousals associated with PLMs were rare.  DIAGNOSIS - Obstructive Sleep Apnea (G47.33)  RECOMMENDATIONS - Recommend an initial trial of BiPAP Auto with EPAP min of 15, pressure support of 4, and IPAP max of 25 with heated humidification.  A Medium size Resmed Full Face Mask AirFit F20 mask was used for the titration.  - Effort should be made to optimize nasal and oropharyngeal patency. - Avoid alcohol, sedatives and other CNS depressants that may worsen sleep apnea and disrupt normal sleep architecture. - Sleep hygiene should be reviewed to assess factors that may improve sleep quality. - Weight management and regular exercise should be initiated or continued. - Recommend a download in 30 days and sleep clinic evaluation after 4 weeks of  therapy.  [Electronically signed] 10/06/2021 11:53 AM  Shelva Majestic MD, [facility], [place], American Board of Sleep Medicine   NPI:  [id number] [place] PH: [phone]   FX: [phone] [place]

## 2021-10-07 ENCOUNTER — Ambulatory Visit (INDEPENDENT_AMBULATORY_CARE_PROVIDER_SITE_OTHER): Payer: Medicare Other

## 2021-10-07 DIAGNOSIS — Z8673 Personal history of transient ischemic attack (TIA), and cerebral infarction without residual deficits: Secondary | ICD-10-CM

## 2021-10-10 ENCOUNTER — Telehealth: Payer: Self-pay | Admitting: *Deleted

## 2021-10-10 NOTE — Telephone Encounter (Signed)
-----   Message from Troy Sine, MD sent at 10/06/2021 12:02 PM EST ----- Mariann Laster, please notify pt and set up BiPAP

## 2021-10-10 NOTE — Telephone Encounter (Signed)
Patient returned a call to me and was notified that order for a BIPAP machine has been sent to [facility].

## 2021-10-14 NOTE — Progress Notes (Signed)
[facility] Summary Report / Loop Recorder 

## 2021-10-15 ENCOUNTER — Ambulatory Visit (HOSPITAL_COMMUNITY)
Admission: RE | Admit: 2021-10-15 | Discharge: 2021-10-15 | Disposition: A | Discharge status: Home / Self Care | Payer: Medicare Other | Source: Ambulatory Visit | Attending: Nurse Practitioner | Admitting: Nurse Practitioner

## 2021-10-15 ENCOUNTER — Other Ambulatory Visit: Payer: Self-pay

## 2021-10-15 VITALS — BP 140/58 | HR 57 | Ht 66.0 in | Wt 170.2 lb

## 2021-10-15 DIAGNOSIS — D6869 Other thrombophilia: Secondary | ICD-10-CM

## 2021-10-15 DIAGNOSIS — Z8673 Personal history of transient ischemic attack (TIA), and cerebral infarction without residual deficits: Secondary | ICD-10-CM | POA: Diagnosis not present

## 2021-10-15 DIAGNOSIS — I10 Essential (primary) hypertension: Secondary | ICD-10-CM | POA: Insufficient documentation

## 2021-10-15 DIAGNOSIS — Z87891 Personal history of nicotine dependence: Secondary | ICD-10-CM | POA: Diagnosis not present

## 2021-10-15 DIAGNOSIS — I480 Paroxysmal atrial fibrillation: Secondary | ICD-10-CM | POA: Diagnosis not present

## 2021-10-15 NOTE — Progress Notes (Signed)
Primary Care Physician: [name], MD Referring Physician:Dr. Julieta Gutting is a 80 y.o. female with a h/o paroxysmal afib, CVA, HTN, tha is in the afib clinic for one month f/u ablation.  I saw her right after the ablation with a sore "knot" left leg. Ultrasound was ordered and it was a   hematoma. Pt was reassured. This is resolving. Smaller is size today. She has not noted any afib. She feels well. She is in Sinus brady today at 57 bpm.   Today, she denies symptoms of palpitations, chest pain, shortness of breath, orthopnea, PND, lower extremity edema, dizziness, presyncope, syncope, or neurologic sequela. The patient is tolerating medications without difficulties and is otherwise without complaint today.   Past Medical History:  Diagnosis Date   Bradycardia    Hypertension    Paroxysmal atrial fibrillation ([place])    Stroke (cerebrum) [facility])    Past Surgical History:  Procedure Laterality Date   ATRIAL FIBRILLATION ABLATION N/A 09/17/2021   Procedure: ATRIAL FIBRILLATION ABLATION;  Surgeon: Vickie Epley, MD;  Location: [place] [facility];  Service: Cardiovascular;  Laterality: N/A;   CATARACT EXTRACTION, BILATERAL     LOOP RECORDER INSERTION N/A 05/17/2018   Procedure: LOOP RECORDER INSERTION;  Surgeon: Deboraha Sprang, MD;  Location: [place] [facility];  Service: Cardiovascular;  Laterality: N/A;   LOOP RECORDER INSERTION  05/17/2018   Procedure: LOOP RECORDER INSERTION;  Surgeon: Fay Records, MD;  Location: [place];  Service: Cardiovascular;;   TEE WITHOUT CARDIOVERSION N/A 05/17/2018   Procedure: TRANSESOPHAGEAL ECHOCARDIOGRAM (TEE);  Surgeon: Fay Records, MD;  Location: [facility] ENDOSCOPY;  Service: Cardiovascular;  Laterality: N/A;   TRIGGER FINGER RELEASE     TUMOR REMOVAL      Current Outpatient Medications  Medication Sig Dispense Refill   acetaminophen (TYLENOL) 325 MG tablet Take 650 mg by mouth every 6 (six) hours as needed for moderate pain or  fever.     apixaban (ELIQUIS) 5 MG TABS tablet Take 1 tablet (5 mg total) by mouth 2 (two) times daily. Overdue for labwork, MUST have repeat labwork at office visit prior to FUTURE refills. 180 tablet 0   atorvastatin (LIPITOR) 40 MG tablet Take 1 tablet (40 mg total) by mouth daily at 6 PM. 30 tablet 3   cholecalciferol (VITAMIN D3) 25 MCG (1000 UNIT) tablet Take 1,000 Units by mouth daily.     clobetasol ointment (TEMOVATE) 9.32 % Apply 1 application topically daily as needed (skin irritation).     co-enzyme Q-10 50 MG capsule Take 100 mg by mouth daily.     DHA-EPA-Vitamin E (OMEGA-3 COMPLEX PO) Take 1 capsule by mouth at bedtime.     famotidine (PEPCID) 20 MG tablet One after supper (Patient taking differently: Take 20 mg by mouth daily with supper.) 30 tablet 11   Multiple Vitamins-Minerals (IMMUNE SUPPORT PO) Take 7 tablets by mouth at bedtime. Id Life  PM Package Omega 3 complex Vit d3 plus cofactors Advance pm Axitant plus     OVER THE COUNTER MEDICATION Take 1 Package by mouth daily. Multivitamin D life Vitamin pack (morning Pack)  Spirulina/chlorella Advance AM     pantoprazole (PROTONIX) 40 MG tablet Take 1 tablet (40 mg total) by mouth daily. Take 30-60 min before first meal of the day 30 tablet 2   Triamcinolone Acetonide (NASACORT AQ NA) Place 1 spray into both nostrils daily as needed (allergies).      Turmeric 500 MG CAPS  Take 1,000 mg by mouth daily.     verapamil (CALAN) 40 MG tablet Take 1 tablet (40 mg total) by mouth daily. 90 tablet 3   No current facility-administered medications for this encounter.    No Known Allergies  Social History   Socioeconomic History   Marital status: Single    Spouse name: Not on file   Number of children: Not on file   Years of education: Not on file   Highest education level: Not on file  Occupational History   Not on file  Tobacco Use   Smoking status: Former    Packs/day: 2.00    Years: 30.00    Pack years: 60.00     Types: Cigarettes    Quit date: 11/24/1990    Years since quitting: 30.9   Smokeless tobacco: Never  Vaping Use   Vaping Use: Never used  Substance and Sexual Activity   Alcohol use: No   Drug use: Never   Sexual activity: Not on file  Other Topics Concern   Not on file  Social History Narrative   Not on file   Social Determinants of Health   Financial Resource Strain: Not on file  Food Insecurity: Not on file  Transportation Needs: Not on file  Physical Activity: Not on file  Stress: Not on file  Social Connections: Not on file  Intimate Partner Violence: Not on file    Family History  Problem Relation Age of Onset   Breast cancer Mother    Diabetes Mother    Thyroid disease Mother    Heart disease Father    Hypertension Sister    Thyroid disease Sister    Diabetes Brother    Hypertension Brother     ROS- All systems are reviewed and negative except as per the HPI above  Physical Exam: Vitals:   10/15/21 0840  Weight: 77.2 kg  Height: 5\' 6"  (1.676 m)   Wt Readings from Last 3 Encounters:  10/15/21 77.2 kg  09/23/21 75.8 kg  09/17/21 74.8 kg    Labs: Lab Results  Component Value Date   NA 139 09/03/2021   K 4.5 09/03/2021   CL 103 09/03/2021   CO2 25 09/03/2021   GLUCOSE 97 09/03/2021   BUN 18 09/03/2021   CREATININE 0.75 09/03/2021   CALCIUM 10.7 (H) 09/03/2021   Lab Results  Component Value Date   INR 1.02 05/15/2018   Lab Results  Component Value Date   CHOL 182 05/15/2018   HDL 53 05/15/2018   LDLCALC 106 (H) 05/15/2018   TRIG 115 05/15/2018     GEN- The patient is well appearing, alert and oriented x 3 today.   Head- normocephalic, atraumatic Eyes-  Sclera clear, conjunctiva pink Ears- hearing intact Oropharynx- clear Neck- supple, no JVP Lymph- no cervical lymphadenopathy Lungs- Clear to ausculation bilaterally, normal work of breathing Heart- Regular rate and rhythm, no murmurs, rubs or gallops, PMI not laterally displaced GI-  soft, NT, ND, + BS Extremities- no clubbing, cyanosis, or edema MS- no significant deformity or atrophy Skin- no rash or lesion Psych- euthymic mood, full affect Neuro- strength and sensation are intact  EKG-Vent. rate 57 BPM PR interval 156 ms QRS duration 92 ms QT/QTcB 434/422 ms P-R-T axes 60 -55 71 Sinus bradycardia Left axis deviation Abnormal ECG    Assessment and Plan:  1. Afib One month s/p ablation Is doing well staying in SR  Continue verapamil  Hematoma  left groin is resolving.  2. HTN Stable   3. CHA2DS2VASc  score of  6 Continue eliquis 5 mg bid  Reminded not to interrupt anticoagulation for the 3 month healing period  F/u with Dr. Quentin Ore as scheduled   Geroge Baseman. Cahlil Sattar, [facility] [address] [place], [place] [address] [phone]

## 2021-10-22 NOTE — Telephone Encounter (Signed)
Left message for patient to call office.  

## 2021-10-22 NOTE — Telephone Encounter (Signed)
Spoke with patient and informed her that she was not able to stop Eliquis and patient voiced understanding. Reschedule appointment Thursday 01/02/22.

## 2021-10-22 NOTE — Progress Notes (Signed)
@Patient  ID: Kristin Lowe, female    DOB: 12/04/1940, 80 y.o.   MRN: [id number]  Chief Complaint  Patient presents with   Follow-up    Referring provider: [name], MD  HPI: 80 year old female, former smoker. PMH significant for HTN, afib, CVA, sinus bradycardia, OSA, multiple thyroid nodules. Patient of Dr. Melvyn Novas, seen for initial consult on 09/11/21 for cough. Started on pantoprazole 40mg  daily and Pepcid 20mg  at bedtime.   10/23/2021  Patient presents today for 6 week follow-up. She was started on PPI and H2 blocker at last visit.  She feels a little difference since starting medication, still complains of upper airway cough. She has stopped using cough drops and has elevated her head of bead at night when sleeping. She is using nasal lavage and Nasacort as directed. She is scheduled for upper GI and colonoscopy in February. If normal, may consider referral ENT. CT sinuses was never ordered, she had MRI brain in 2019 that showed normal sinuses/orbits. No chest imaging or PFTs on file. Denies shortness of breath or wheezing.   No Known Allergies  Immunization History  Administered Date(s) Administered   Hepatitis A, Adult 04/21/2018, 11/04/2018   Influenza, High Dose Seasonal PF 08/14/2017   Influenza, Quadrivalent, Recombinant, Inj, Pf 07/21/2018, 08/19/2019, 08/29/2020, 08/26/2021   Influenza-Unspecified 07/21/2018, 08/26/2021   PFIZER(Purple Top)SARS-COV-2 Vaccination 12/19/2019, 01/09/2020   Pneumococcal Conjugate-13 12/10/2018    Past Medical History:  Diagnosis Date   Bradycardia    Hypertension    Paroxysmal atrial fibrillation ([facility])    Stroke (cerebrum) ([facility])     Tobacco History: Social History   Tobacco Use  Smoking Status Former   Packs/day: 2.00   Years: 30.00   Pack years: 60.00   Types: Cigarettes   Quit date: 11/24/1990   Years since quitting: 30.9  Smokeless Tobacco Never   Counseling given: Not Answered   Outpatient Medications Prior to  Visit  Medication Sig Dispense Refill   acetaminophen (TYLENOL) 325 MG tablet Take 650 mg by mouth every 6 (six) hours as needed for moderate pain or fever.     apixaban (ELIQUIS) 5 MG TABS tablet Take 1 tablet (5 mg total) by mouth 2 (two) times daily. Overdue for labwork, MUST have repeat labwork at office visit prior to FUTURE refills. 180 tablet 0   atorvastatin (LIPITOR) 40 MG tablet Take 1 tablet (40 mg total) by mouth daily at 6 PM. 30 tablet 3   cholecalciferol (VITAMIN D3) 25 MCG (1000 UNIT) tablet Take 1,000 Units by mouth daily.     clobetasol ointment (TEMOVATE) 7.82 % Apply 1 application topically daily as needed (skin irritation).     co-enzyme Q-10 50 MG capsule Take 100 mg by mouth daily.     DHA-EPA-Vitamin E (OMEGA-3 COMPLEX PO) Take 1 capsule by mouth at bedtime.     famotidine (PEPCID) 20 MG tablet One after supper (Patient taking differently: Take 20 mg by mouth daily with supper.) 30 tablet 11   Multiple Vitamins-Minerals (IMMUNE SUPPORT PO) Take 7 tablets by mouth at bedtime. Id Life  PM Package Omega 3 complex Vit d3 plus cofactors Advance pm Axitant plus     OVER THE COUNTER MEDICATION Take 1 Package by mouth daily. Multivitamin D life Vitamin pack (morning Pack)  Spirulina/chlorella Advance AM     pantoprazole (PROTONIX) 40 MG tablet Take 1 tablet (40 mg total) by mouth daily. Take 30-60 min before first meal of the day 30 tablet 2   Triamcinolone Acetonide (  NASACORT AQ NA) Place 1 spray into both nostrils daily as needed (allergies).      Turmeric 500 MG CAPS Take 1,000 mg by mouth daily.     verapamil (CALAN) 40 MG tablet Take 1 tablet (40 mg total) by mouth daily. 90 tablet 3   No facility-administered medications prior to visit.   Review of Systems  Review of Systems  Constitutional: Negative.   HENT: Negative.    Respiratory:  Positive for cough. Negative for chest tightness, shortness of breath and wheezing.     Physical Exam  BP 132/80 (BP  Location: Left Arm, Cuff Size: Normal)   Pulse 61   Temp 98 F (36.7 C)   Ht 5\' 6"  (1.676 m)   Wt 169 lb 9.6 oz (76.9 kg)   SpO2 98%   BMI 27.37 kg/m  Physical Exam Constitutional:      Appearance: Normal appearance.  HENT:     Head: Normocephalic and atraumatic.     Mouth/Throat:     Mouth: Mucous membranes are moist.     Pharynx: Oropharynx is clear.  Cardiovascular:     Rate and Rhythm: Normal rate and regular rhythm.  Pulmonary:     Effort: Pulmonary effort is normal.     Breath sounds: Normal breath sounds. No wheezing, rhonchi or rales.  Musculoskeletal:        General: Normal range of motion.  Skin:    General: Skin is warm and dry.  Neurological:     General: No focal deficit present.     Mental Status: She is alert and oriented to person, place, and time. Mental status is at baseline.  Psychiatric:        Mood and Affect: Mood normal.        Behavior: Behavior normal.        Thought Content: Thought content normal.        Judgment: Judgment normal.     Lab Results:  CBC    Component Value Date/Time   WBC 6.0 09/11/2021 1207   RBC 4.34 09/11/2021 1207   HGB 12.8 09/11/2021 1207   HGB 12.7 09/03/2021 0752   HCT 39.4 09/11/2021 1207   HCT 37.4 09/03/2021 0752   PLT 264.0 09/11/2021 1207   PLT 280 09/03/2021 0752   MCV 90.9 09/11/2021 1207   MCV 90 09/03/2021 0752   MCH 30.5 09/03/2021 0752   MCH 29.6 01/24/2019 1017   MCHC 32.6 09/11/2021 1207   RDW 13.4 09/11/2021 1207   RDW 12.2 09/03/2021 0752   LYMPHSABS 1.3 09/11/2021 1207   LYMPHSABS 1.4 09/03/2021 0752   MONOABS 0.6 09/11/2021 1207   EOSABS 0.1 09/11/2021 1207   EOSABS 0.2 09/03/2021 0752   BASOSABS 0.0 09/11/2021 1207   BASOSABS 0.1 09/03/2021 0752    BMET    Component Value Date/Time   NA 139 09/03/2021 0752   K 4.5 09/03/2021 0752   CL 103 09/03/2021 0752   CO2 25 09/03/2021 0752   GLUCOSE 97 09/03/2021 0752   GLUCOSE 152 (H) 01/24/2019 1017   BUN 18 09/03/2021 0752    CREATININE 0.75 09/03/2021 0752   CALCIUM 10.7 (H) 09/03/2021 0752   GFRNONAA >60 01/24/2019 1017   GFRAA >60 01/24/2019 1017    BNP No results found for: BNP  ProBNP No results found for: PROBNP  Imaging: VAS [place] GROIN PSEUDOANEURYSM  Result Date: 09/24/2021  ARTERIAL PSEUDOANEURYSM  Patient Name:  FATE GALANTI  Date of Exam:   09/24/2021 Medical Rec #:  [id number]          Accession #:    [id number] Date of Birth: 26-Jul-1941          Patient Gender: F Patient Age:   5 years Exam Location:  [facility] Procedure:      VAS [place] GROIN PSEUDOANEURYSM Referring Phys: Roderic Palau --------------------------------------------------------------------------------  Exam: Left groin Indications: Patient complains of bruising and palpable knot. History: Post ablation. Comparison Study: No prior. Performing Technologist: Oda Cogan RDMS, RVT  Examination Guidelines: A complete evaluation includes B-mode imaging, spectral Doppler, color Doppler, and power Doppler as needed of all accessible portions of each vessel. Bilateral testing is considered an integral part of a complete examination. Limited examinations for reoccurring indications may be performed as noted. +-----------+----------+--------+------+----------+ Left DuplexPSV (cm/s)WaveformPlaqueComment(s) +-----------+----------+--------+------+----------+ CFA           107                             +-----------+----------+--------+------+----------+ Prox SFA      120                             +-----------+----------+--------+------+----------+ Left Vein comments: Patent Common femoral vein  Summary: No evidence of pseudoaneurysm, AVF or DVT There is evidence of a small hematoma noted in left groin.  Diagnosing physician: Deitra Mayo MD Electronically signed by Deitra Mayo MD on 09/24/2021 at 5:06:12 PM.    --------------------------------------------------------------------------------    Final    CUP  PACEART REMOTE DEVICE CHECK  Result Date: 10/03/2021 ILR summary report received. Battery status OK. Normal device function. No new symptom, tachy, brady, or pause episodes. No new AF episodes. Monthly summary reports and ROV/PRN LR    Assessment & Plan:   Chronic cough - Improved with addition of Pantoprazole and Pecid. No chest imaging in chart, we will have to request results from [place]. She did not have CT sinuses, MRI brain in 2019 showed normal sinuses- no indication to repeat at this time. She was ordered for PFTs but these have not been completed. She is scheduled for endoscopy with GI in February 2023. If exam normal recommend referral to ENT. Follow-up in 1-2 months with PFTs/OV.    Martyn Ehrich, NP 10/23/2021

## 2021-10-23 ENCOUNTER — Encounter: Payer: Self-pay | Admitting: Primary Care

## 2021-10-23 ENCOUNTER — Other Ambulatory Visit: Payer: Self-pay

## 2021-10-23 ENCOUNTER — Ambulatory Visit: Payer: Medicare Other

## 2021-10-23 ENCOUNTER — Telehealth: Payer: Self-pay | Admitting: Primary Care

## 2021-10-23 ENCOUNTER — Ambulatory Visit (INDEPENDENT_AMBULATORY_CARE_PROVIDER_SITE_OTHER): Payer: Medicare Other | Admitting: Primary Care

## 2021-10-23 DIAGNOSIS — R053 Chronic cough: Secondary | ICD-10-CM | POA: Diagnosis not present

## 2021-10-23 NOTE — Patient Instructions (Addendum)
Continue pantoprazole 40mg  daily and Pepcid 20mg  at bedtime  If upper GI is normal, then recommend referral to ENT   Orders: CXR re: chronic cough   Follow-up: As needed if cough does not improve and no findings from GI/ENT

## 2021-10-23 NOTE — Telephone Encounter (Signed)
Patient was scheduled for PFT and Office visit with Dr. Melvyn Novas in January due to PFT availability.

## 2021-10-23 NOTE — Telephone Encounter (Signed)
  Patient states she gets CXR at [facility] annually, we will need to request these  Also appears she was ordered for PFTs on 10/19, these need to be scheduled.

## 2021-10-23 NOTE — Assessment & Plan Note (Addendum)
-   Improved with addition of Pantoprazole and Pecid. No chest imaging in chart, we will have to request results from [place]. She did not have CT sinuses, MRI brain in 2019 showed normal sinuses- no indication to repeat at this time. She was ordered for PFTs but these have not been completed. She is scheduled for endoscopy with GI in February 2023. If exam normal recommend referral to ENT. Follow-up in 1-2 months with PFTs/OV.

## 2021-10-24 ENCOUNTER — Telehealth: Payer: Self-pay | Admitting: *Deleted

## 2021-10-24 ENCOUNTER — Telehealth: Payer: Self-pay

## 2021-10-24 DIAGNOSIS — R931 Abnormal findings on diagnostic imaging of heart and coronary circulation: Secondary | ICD-10-CM

## 2021-10-24 NOTE — Telephone Encounter (Signed)
   Pre-operative Risk Assessment    Patient Name: Kristin Lowe  DOB: 28-Aug-1941 MRN: [id number]      Request for Surgical Clearance   Procedure:   LEFT TOTAL KNEE ARTHROPLASTY  Date of Surgery: Clearance TBD                                 Surgeon:  DR. Hart Robinsons Surgeon's Group or Practice Name:  [facility] Phone number:  [phone] Fax number:  [phone] ATTN: KELLY MAZE   Type of Clearance Requested: - Medical  - Pharmacy:  Hold Apixaban (Eliquis)     Type of Anesthesia:   SPINAL WITH ADDUCTOR CANAL BLOCK   Additional requests/questions:   Jiles Prows   10/24/2021, 4:14 PM

## 2021-10-24 NOTE — Telephone Encounter (Signed)
Pharmacy, can you please comment on how long Eliquis can be held for upcoming procedure?  Thank you! 

## 2021-10-24 NOTE — Telephone Encounter (Signed)
Discussed with Dr. Quentin Ore and offered patient to schedule LAAO. At this time, she wishes to wait to discuss with Dr. Quentin Ore at her next visit 12/20/21. She was grateful for follow-up.

## 2021-10-25 DIAGNOSIS — R931 Abnormal findings on diagnostic imaging of heart and coronary circulation: Secondary | ICD-10-CM | POA: Insufficient documentation

## 2021-10-25 NOTE — Telephone Encounter (Signed)
Patient with diagnosis of afib on Eliquis for anticoagulation.    Procedure: left TKA Date of procedure: TBD  CHA2DS2-VASc Score = 7  This indicates a 11.2% annual risk of stroke. 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   CrCl 27mL/min using adjusted body weight Platelet count 264K  Underwent afib ablation 09/17/21. Cannot interrupt anticoagulation for 3 months after. Earliest pt can have procedure would be the week of Dec 16, 2021. Typically recommend holding Eliquis for 3 days prior to TKA. However, given pt's elevated CV risk including hx of stroke secondary to afib, will forward to MD for input.

## 2021-10-29 NOTE — Telephone Encounter (Signed)
   Name: Kristin Lowe  DOB: 05-Nov-1941  MRN: [id number]   Primary Cardiologist: Buford Dresser, MD  Chart reviewed as part of pre-operative protocol coverage. Patient was contacted 10/29/2021 in reference to pre-operative risk assessment for pending surgery as outlined below.  Kristin Lowe was last seen on 10/15/21 by Roderic Palau NP.  Since that day, Kristin Lowe has done well.   Per our clinical pharmacist and confirmed with Dr. [name]: She underwent afib ablation 09/17/21. Cannot interrupt anticoagulation for 3 months after. Earliest pt can have procedure would be the week of Dec 16, 2021. Typically recommend holding Eliquis for 3 days prior to TKA.   I called the patient to clear her for knee surgery. She advises that she may be having a watchman placed. She will see Dr. Quentin Ore in January and discuss timeline. She wants to postpone knee surgery at this time. She did not want to complete clearance.   I will route this recommendation to the requesting party via Epic fax function and remove from pre-op pool. Please call with questions.  Tami Lin Cathey [place], PA 10/29/2021, 9:51 AM

## 2021-10-29 NOTE — Telephone Encounter (Signed)
Deboraha Sprang, MD  Elba Dendinger, Harlon Flor, RPH-CPP Caller: Unspecified (5 days ago,  4:14 PM) Agree that should procedure should be delayed and then hold as necessary for total knee  Thanks SK

## 2021-11-05 ENCOUNTER — Telehealth: Payer: Self-pay

## 2021-11-05 NOTE — Telephone Encounter (Signed)
ILR alert for RRT, triggered 10/31/21. No events logged. Routing for further review/management.   Unsuccessful telephone encounter to patient to discuss loop recorder battery status at RRT. Hipaa compliant VM message left requesting call back to [phone]. Once discussed with patient Paceart and carelink will be updated. Will cancel future remote appointments at this time.

## 2021-11-05 NOTE — Telephone Encounter (Signed)
Patient returning call. Discussed loop recorder at RRT. Patient wishes to have device removed. Will send to scheduling. Future remote appointments cancelled. Patient marked inactive in paceart and discontinued in carelink. Address confirmed and return kit sent. Patient appreciative of call.

## 2021-11-13 ENCOUNTER — Other Ambulatory Visit: Payer: Self-pay | Admitting: Internal Medicine

## 2021-11-13 ENCOUNTER — Other Ambulatory Visit: Payer: Self-pay | Admitting: Obstetrics and Gynecology

## 2021-11-13 DIAGNOSIS — Z1231 Encounter for screening mammogram for malignant neoplasm of breast: Secondary | ICD-10-CM

## 2021-11-14 ENCOUNTER — Telehealth: Payer: Self-pay | Admitting: *Deleted

## 2021-11-14 DIAGNOSIS — R82998 Other abnormal findings in urine: Secondary | ICD-10-CM | POA: Diagnosis not present

## 2021-11-14 DIAGNOSIS — Z1339 Encounter for screening examination for other mental health and behavioral disorders: Secondary | ICD-10-CM | POA: Diagnosis not present

## 2021-11-14 DIAGNOSIS — I480 Paroxysmal atrial fibrillation: Secondary | ICD-10-CM | POA: Diagnosis not present

## 2021-11-14 DIAGNOSIS — M8580 Other specified disorders of bone density and structure, unspecified site: Secondary | ICD-10-CM | POA: Diagnosis not present

## 2021-11-14 DIAGNOSIS — Z0000 Encounter for general adult medical examination without abnormal findings: Secondary | ICD-10-CM | POA: Diagnosis not present

## 2021-11-14 DIAGNOSIS — M4302 Spondylolysis, cervical region: Secondary | ICD-10-CM | POA: Diagnosis not present

## 2021-11-14 DIAGNOSIS — J45909 Unspecified asthma, uncomplicated: Secondary | ICD-10-CM | POA: Diagnosis not present

## 2021-11-14 DIAGNOSIS — I6389 Other cerebral infarction: Secondary | ICD-10-CM | POA: Diagnosis not present

## 2021-11-14 DIAGNOSIS — R3121 Asymptomatic microscopic hematuria: Secondary | ICD-10-CM | POA: Diagnosis not present

## 2021-11-14 DIAGNOSIS — M170 Bilateral primary osteoarthritis of knee: Secondary | ICD-10-CM | POA: Diagnosis not present

## 2021-11-14 DIAGNOSIS — Z1331 Encounter for screening for depression: Secondary | ICD-10-CM | POA: Diagnosis not present

## 2021-11-14 DIAGNOSIS — D6869 Other thrombophilia: Secondary | ICD-10-CM | POA: Diagnosis not present

## 2021-11-14 NOTE — Telephone Encounter (Signed)
Patient called in to  request for me to send a copy of her sleep study over to her PCP, Dr. Joylene Draft. She has appointment today. Copy faxed per patient verbal request.

## 2021-11-30 ENCOUNTER — Other Ambulatory Visit: Payer: Self-pay | Admitting: Internal Medicine

## 2021-11-30 DIAGNOSIS — R053 Chronic cough: Secondary | ICD-10-CM

## 2021-12-02 ENCOUNTER — Encounter: Payer: Medicare Other | Admitting: Gastroenterology

## 2021-12-12 DIAGNOSIS — D1801 Hemangioma of skin and subcutaneous tissue: Secondary | ICD-10-CM | POA: Diagnosis not present

## 2021-12-12 DIAGNOSIS — L821 Other seborrheic keratosis: Secondary | ICD-10-CM | POA: Diagnosis not present

## 2021-12-12 DIAGNOSIS — D2262 Melanocytic nevi of left upper limb, including shoulder: Secondary | ICD-10-CM | POA: Diagnosis not present

## 2021-12-12 DIAGNOSIS — L820 Inflamed seborrheic keratosis: Secondary | ICD-10-CM | POA: Diagnosis not present

## 2021-12-13 ENCOUNTER — Ambulatory Visit (INDEPENDENT_AMBULATORY_CARE_PROVIDER_SITE_OTHER): Payer: Medicare Other | Admitting: Internal Medicine

## 2021-12-13 ENCOUNTER — Encounter: Payer: Self-pay | Admitting: Internal Medicine

## 2021-12-13 ENCOUNTER — Other Ambulatory Visit: Payer: Self-pay

## 2021-12-13 DIAGNOSIS — R053 Chronic cough: Secondary | ICD-10-CM | POA: Diagnosis not present

## 2021-12-13 LAB — PULMONARY FUNCTION TEST
DL/VA % pred: 78 %
DL/VA: 3.18 ml/min/mmHg/L
DLCO cor % pred: 76 %
DLCO cor: 15.25 ml/min/mmHg
DLCO unc % pred: 76 %
DLCO unc: 15.25 ml/min/mmHg
FEF 25-75 Post: 1.46 L/sec
FEF 25-75 Pre: 1.87 L/sec
FEF2575-%Change-Post: -21 %
FEF2575-%Pred-Post: 91 %
FEF2575-%Pred-Pre: 117 %
FEV1-%Change-Post: -2 %
FEV1-%Pred-Post: 101 %
FEV1-%Pred-Pre: 103 %
FEV1-Post: 2.18 L
FEV1-Pre: 2.24 L
FEV1FVC-%Change-Post: 0 %
FEV1FVC-%Pred-Pre: 102 %
FEV6-%Change-Post: -2 %
FEV6-%Pred-Post: 102 %
FEV6-%Pred-Pre: 105 %
FEV6-Post: 2.81 L
FEV6-Pre: 2.89 L
FEV6FVC-%Change-Post: 0 %
FEV6FVC-%Pred-Post: 103 %
FEV6FVC-%Pred-Pre: 103 %
FVC-%Change-Post: -3 %
FVC-%Pred-Post: 99 %
FVC-%Pred-Pre: 102 %
FVC-Post: 2.85 L
FVC-Pre: 2.95 L
Post FEV1/FVC ratio: 77 %
Post FEV6/FVC ratio: 99 %
Pre FEV1/FVC ratio: 76 %
Pre FEV6/FVC Ratio: 98 %
RV % pred: 127 %
RV: 3.08 L
TLC % pred: 111 %
TLC: 5.87 L

## 2021-12-13 NOTE — Patient Instructions (Signed)
No change in medications  I will be referring you to Dr Carol Ada at [facility] voice center / ent dep    If you are satisfied with your treatment plan,  let your doctor know and he/she can either refill your medications or you can return here when your prescription runs out.     If in any way you are not 100% satisfied,  please tell us.  If 100% better, tell your friends!  Pulmonary follow up is as needed

## 2021-12-13 NOTE — Progress Notes (Signed)
Kristin Lowe, female    DOB: 1941/05/29,  MRN: [id number]   Brief patient profile:  33 yowf from [place] quit smoking 1992 with frequent bouts of cough some better p quit smoking and worse since around the time of covid = spring of 2020 referred to pulmonary clinic 09/11/2021 by Dr   Joylene Draft for cough        History of Present Illness g 09/11/2021  Pulmonary/ 1st office eval/Kristin Lowe  Chief Complaint  Patient presents with   Pulmonary Consult    Referred by Dr. [name]. Pt c/o cough for the past 3 years, worse over the past year. She used to live in [place] and never had cough.  Her cough is prod with green sputum.    Dyspnea:  Not limited by breathing from desired activities   Cough: p stirs  pretty much each am > green mucus x tsp x up to up to an hour  then good for the rest of the day x sometimes with meals/ uses nettipot with variably purulent sputum / severe cough to point of choking  Sleep: on side bed flat with big pillow Kristin use: none Spring time rhinitis worse since living  in [place]  Rec Pantoprazole (protonix) 40 mg   Take  30-60 min before first meal of the day and Pepcid (famotidine)  20 mg after supper until return to office  GERD diet reviewed, bed blocks rec  - Allergy profile 09/11/2021 >  Eos 0.1 /  IgE  4 - Gerd rx trial 09/11/2021 >> cough improved but not resolved  - Sinus CT 09/11/2021 >>> this was never ordered, she had brain MRI in 2019 that showed normal sinuses/orbits   10/23/21 Kristin Napoleon NP rec DgEs not done    12/13/2021  f/u ov/Kristin Lowe re: cough since covid spring 2020   maint on gerd rx   Chief Complaint  Patient presents with   Follow-up    PFT done today. Her cough is some better.   Dyspnea:  not limited doing some step ex  Cough: mostly p stirs, min mucus  Sleeping: 4 inch bed blocks  Kristin use: none  02: none  Covid status:   vax x 4 includes bivalent    No obvious day to day or daytime variability or assoc excess/ purulent sputum or mucus  plugs or hemoptysis or cp or chest tightness, subjective wheeze or overt sinus or hb symptoms.   Sleeping as above without nocturnal  or early am exacerbation  of respiratory  c/o's or need for noct Kristin. Also denies any obvious fluctuation of symptoms with weather or environmental changes or other aggravating or alleviating factors except as outlined above   No unusual exposure hx or h/o childhood pna/ asthma or knowledge of premature birth.  Current Allergies, Complete Past Medical History, Past Surgical History, Family History, and Social History were reviewed in [employer] record.  ROS  The following are not active complaints unless bolded Hoarseness, sore throat= globus, dysphagia, dental problems, itching, sneezing,  nasal congestion or discharge of excess mucus or purulent secretions, ear ache,   fever, chills, sweats, unintended wt loss or wt gain, classically pleuritic or exertional cp,  orthopnea pnd or arm/hand swelling  or leg swelling, presyncope, palpitations, abdominal pain, anorexia, nausea, vomiting, diarrhea  or change in bowel habits or change in bladder habits, change in stools or change in urine, dysuria, hematuria,  rash, arthralgias, visual complaints, headache, numbness, weakness or ataxia or problems with walking  or coordination,  change in mood or  memory.        Current Meds  Medication Sig   acetaminophen (TYLENOL) 325 MG tablet Take 650 mg by mouth every 6 (six) hours as needed for moderate pain or fever.   apixaban (ELIQUIS) 5 MG TABS tablet Take 1 tablet (5 mg total) by mouth 2 (two) times daily. Overdue for labwork, MUST have repeat labwork at office visit prior to FUTURE refills.   atorvastatin (LIPITOR) 40 MG tablet Take 1 tablet (40 mg total) by mouth daily at 6 PM.   cholecalciferol (VITAMIN D3) 25 MCG (1000 UNIT) tablet Take 1,000 Units by mouth daily.   clobetasol ointment (TEMOVATE) 3.00 % Apply 1 application topically daily as needed  (skin irritation).   co-enzyme Q-10 50 MG capsule Take 100 mg by mouth daily.   DHA-EPA-Vitamin E (OMEGA-3 COMPLEX PO) Take 1 capsule by mouth at bedtime.   famotidine (PEPCID) 20 MG tablet One after supper (Patient taking differently: Take 20 mg by mouth daily with supper.)   Multiple Vitamins-Minerals (IMMUNE SUPPORT PO) Take 7 tablets by mouth at bedtime. Id Life  PM Package Omega 3 complex Vit d3 plus cofactors Advance pm Axitant plus   OVER THE COUNTER MEDICATION Take 1 Package by mouth daily. Multivitamin D life Vitamin pack (morning Pack)  Spirulina/chlorella Advance AM   pantoprazole (PROTONIX) 40 MG tablet TAKE 1 TABLET BY MOUTH DAILY. TAKE 30-60 MIN BEFORE FIRST MEAL OF THE DAY   Triamcinolone Acetonide (NASACORT AQ NA) Place 1 spray into both nostrils daily as needed (allergies).    Turmeric 500 MG CAPS Take 1,000 mg by mouth daily.   verapamil (CALAN) 40 MG tablet Take 1 tablet (40 mg total) by mouth daily.          Past Medical History:  Diagnosis Date   Bradycardia    Hypertension    Paroxysmal atrial fibrillation (HCC)    Stroke (cerebrum) (HCC)        Objective:      Wt Readings from Last 3 Encounters:  12/13/21 171 lb (77.6 kg)  10/23/21 169 lb 9.6 oz (76.9 kg)  10/15/21 170 lb 3.2 oz (77.2 kg)      Vital signs reviewed  12/13/2021  - Note at rest 02 sats  100% on RA   General appearance:    pleasant amb wf freq throat clearing   HEENT : pt wearing mask not removed for exam due to covid -19 concerns.    NECK :  without JVD/Nodes/TM/ nl carotid upstrokes bilaterally   LUNGS: no acc muscle use,  Nl contour chest which is clear to A and P bilaterally without cough on insp or exp maneuvers   CV:  RRR  no s3 or murmur or increase in P2, and no edema   ABD:  soft and nontender with nl inspiratory excursion in the supine position. No bruits or organomegaly appreciated, bowel sounds nl  MS:  Nl gait/ ext warm without deformities, calf tenderness,  cyanosis or clubbing No obvious joint restrictions   SKIN: warm and dry without lesions    NEURO:  alert, approp, nl sensorium with  no motor or cerebellar deficits apparent.     Cxr ordered 10/23/21 did not go     Assessment

## 2021-12-13 NOTE — Progress Notes (Signed)
Full PFT completed today ? ?

## 2021-12-13 NOTE — Assessment & Plan Note (Addendum)
Onset while smoking quit in 1992 but improved  - Allergy profile 09/11/2021 >  Eos 0.1 /  IgE  4 - Gerd rx trial 09/11/2021 >> cough improved but not resolved  - Sinus CT 09/11/2021 >>> this was never ordered, she had brain MRI in 2019 that showed normal sinuses/orbits  - 12/13/2021 pfts nl including f/u loop  >>>  Referred to Bettina Gavia at [facility] voice center  Pattern is one of refractory cough x 30 years with classic upper airway features despite max rx for acid component of gerd and persistent globus function with nl f/v loop today > next step is formal ENT eval so referred to [facility]/ Dr Joya Gaskins and return here prn           Each maintenance medication was reviewed in detail including emphasizing most importantly the difference between maintenance and prns and under what circumstances the prns are to be triggered using an action plan format where appropriate.  Total time for H and P, chart review, counseling, reviewing pft  and generating customized AVS unique to this office visit / same day charting = 25 min

## 2021-12-16 ENCOUNTER — Ambulatory Visit
Admission: RE | Admit: 2021-12-16 | Discharge: 2021-12-16 | Disposition: A | Discharge status: Home / Self Care | Payer: Medicare Other | Source: Ambulatory Visit | Attending: Obstetrics and Gynecology | Admitting: Obstetrics and Gynecology

## 2021-12-16 ENCOUNTER — Other Ambulatory Visit: Payer: Self-pay

## 2021-12-16 DIAGNOSIS — Z1231 Encounter for screening mammogram for malignant neoplasm of breast: Secondary | ICD-10-CM

## 2021-12-19 NOTE — Progress Notes (Signed)
Electrophysiology Office Follow up Visit Note:    Date:  12/20/2021   ID:  Kristin Lowe, DOB [birth year], MRN [id number]  PCP:  [name], MD  [facility] Cardiologist:  Buford Dresser, MD  [facility] Electrophysiologist:  Vickie Epley, MD    Interval History:    Kristin Lowe is a 81 y.o. female who presents for a follow up visit.  She underwent an A. fib ablation on September 17, 2021.  She was doing well when she saw Roderic Palau in the A. fib clinic on November 22.  She was maintaining sinus rhythm.  Watchman was discussed at our original appointment.  We plan to perform an ablation first before proceeding with left atrial appendage occlusion.  She presents today to follow-up for her A. fib ablation and to discuss the watchman device.  She is experiencing significant constipation which is a change for her.  She has a planned EGD and colonoscopy coming up.  She also has to get some form of ENT evaluation for coughing.  She is interested in delaying her watchman procedure because of the pending procedures which I think is very reasonable.  She is tolerating her anticoagulant at this time.  No recurrence of atrial fibrillation since the ablation.  She is pleased with the results.     Past Medical History:  Diagnosis Date   Bradycardia    Hypertension    Paroxysmal atrial fibrillation ([place])    Stroke (cerebrum) [facility])     Past Surgical History:  Procedure Laterality Date   ATRIAL FIBRILLATION ABLATION N/A 09/17/2021   Procedure: ATRIAL FIBRILLATION ABLATION;  Surgeon: Vickie Epley, MD;  Location: [place] CV LAB;  Service: Cardiovascular;  Laterality: N/A;   CATARACT EXTRACTION, BILATERAL     LOOP RECORDER INSERTION N/A 05/17/2018   Procedure: LOOP RECORDER INSERTION;  Surgeon: Deboraha Sprang, MD;  Location: [place] CV LAB;  Service: Cardiovascular;  Laterality: N/A;   LOOP RECORDER INSERTION  05/17/2018   Procedure: LOOP RECORDER INSERTION;   Surgeon: Fay Records, MD;  Location: [place];  Service: Cardiovascular;;   TEE WITHOUT CARDIOVERSION N/A 05/17/2018   Procedure: TRANSESOPHAGEAL ECHOCARDIOGRAM (TEE);  Surgeon: Fay Records, MD;  Location: [facility] ENDOSCOPY;  Service: Cardiovascular;  Laterality: N/A;   TRIGGER FINGER RELEASE     TUMOR REMOVAL      Current Medications: Current Meds  Medication Sig   acetaminophen (TYLENOL) 325 MG tablet Take 325 mg by mouth every 6 (six) hours as needed for moderate pain or fever.   apixaban (ELIQUIS) 5 MG TABS tablet Take 1 tablet (5 mg total) by mouth 2 (two) times daily. Overdue for labwork, MUST have repeat labwork at office visit prior to FUTURE refills.   atorvastatin (LIPITOR) 40 MG tablet Take 1 tablet (40 mg total) by mouth daily at 6 PM.   cholecalciferol (VITAMIN D3) 25 MCG (1000 UNIT) tablet Take 1,000 Units by mouth daily.   clobetasol ointment (TEMOVATE) 8.67 % Apply 1 application topically daily as needed (skin irritation).   co-enzyme Q-10 50 MG capsule Take 100 mg by mouth daily.   DHA-EPA-Vitamin E (OMEGA-3 COMPLEX PO) Take 1 capsule by mouth at bedtime.   famotidine (PEPCID) 20 MG tablet One after supper (Patient taking differently: Take 20 mg by mouth daily with supper.)   Multiple Vitamins-Minerals (IMMUNE SUPPORT PO) Take 7 tablets by mouth at bedtime. Id Life  PM Package Omega 3 complex Vit d3 plus cofactors Advance pm Axitant plus  OVER THE COUNTER MEDICATION Take 1 Package by mouth daily. Multivitamin D life Vitamin pack (morning Pack)  Spirulina/chlorella Advance AM   pantoprazole (PROTONIX) 40 MG tablet TAKE 1 TABLET BY MOUTH DAILY. TAKE 30-60 MIN BEFORE FIRST MEAL OF THE DAY   Triamcinolone Acetonide (NASACORT AQ NA) Place 1 spray into both nostrils daily as needed (allergies).    Turmeric 500 MG CAPS Take 1,000 mg by mouth daily.   [DISCONTINUED] verapamil (CALAN) 40 MG tablet Take 1 tablet (40 mg total) by mouth daily.     Allergies:   Patient has  no known allergies.   Social History   Socioeconomic History   Marital status: Single    Spouse name: Not on file   Number of children: Not on file   Years of education: Not on file   Highest education level: Not on file  Occupational History   Not on file  Tobacco Use   Smoking status: Former    Packs/day: 2.00    Years: 30.00    Pack years: 60.00    Types: Cigarettes    Quit date: 11/24/1990    Years since quitting: 31.0   Smokeless tobacco: Never  Vaping Use   Vaping Use: Never used  Substance and Sexual Activity   Alcohol use: No   Drug use: Never   Sexual activity: Not on file  Other Topics Concern   Not on file  Social History Narrative   Not on file   Social Determinants of Health   Financial Resource Strain: Not on file  Food Insecurity: Not on file  Transportation Needs: Not on file  Physical Activity: Not on file  Stress: Not on file  Social Connections: Not on file     Family History: The patient's family history includes Breast cancer in her mother; Diabetes in her brother and mother; Heart disease in her father; Hypertension in her brother and sister; Thyroid disease in her mother and sister.  ROS:   Please see the history of present illness.    All other systems reviewed and are negative.  EKGs/Labs/Other Studies Reviewed:    The following studies were reviewed today:   EKG:  The ekg ordered today demonstrates sinus rhythm  Recent Labs: 07/12/2021: ALT 19; TSH 0.564 09/03/2021: BUN 18; Creatinine, Ser 0.75; Potassium 4.5; Sodium 139 09/11/2021: Hemoglobin 12.8; Platelets 264.0  Recent Lipid Panel    Component Value Date/Time   CHOL 182 05/15/2018 2119   TRIG 115 05/15/2018 2119   HDL 53 05/15/2018 2119   CHOLHDL 3.4 05/15/2018 2119   VLDL 23 05/15/2018 2119   LDLCALC 106 (H) 05/15/2018 2119    Physical Exam:    VS:  BP 124/70    Pulse 64    Ht 5' 5.5" (1.664 m)    Wt 169 lb 6.4 oz (76.8 kg)    SpO2 96%    BMI 27.76 kg/m     Wt  Readings from Last 3 Encounters:  12/20/21 169 lb 6.4 oz (76.8 kg)  12/13/21 171 lb (77.6 kg)  10/23/21 169 lb 9.6 oz (76.9 kg)     GEN:  Well nourished, well developed in no acute distress HEENT: Normal NECK: No JVD; No carotid bruits LYMPHATICS: No lymphadenopathy CARDIAC: RRR, no murmurs, rubs, gallops RESPIRATORY:  Clear to auscultation without rales, wheezing or rhonchi  ABDOMEN: Soft, non-tender, non-distended MUSCULOSKELETAL:  No edema; No deformity  SKIN: Warm and dry NEUROLOGIC:  Alert and oriented x 3 PSYCHIATRIC:  Normal affect  ASSESSMENT:    1. Paroxysmal atrial fibrillation (HCC)   2. History of CVA (cerebrovascular accident)   3. Essential hypertension    PLAN:    In order of problems listed above:  #Paroxysmal atrial fibrillation Maintaining sinus rhythm after her ablation.  Previously We have discussed using the watchman implant to avoid long-term exposure anticoagulation. Given several pending procedures with ENT and gastroenterology, we will delay the watchman implant for now.  She would like to touch base in about 6 to 8 months which I think is very reasonable.  We will get this scheduled.  For now she will continue taking her Eliquis.   She would like to discontinue her verapamil. She will cut the dose in half for 7 days then discontinue altogether.    #Hx of TIA #ILR EOS She would like loop recorder explanted. We will get this scheduled for her.    CHA2DS2-VASc Score = 7  The patient's score is based upon: CHF History: 0 HTN History: 1 Diabetes History: 0 Stroke History: 2 Vascular Disease History: 1 Age Score: 2 Gender Score: 1   {  Follow up 6-8 months to discuss timing of watchman.        Total time spent with patient today 30 minutes. This includes reviewing records, evaluating the patient and coordinating care.   Medication Adjustments/Labs and Tests Ordered: Current medicines are reviewed at length with the patient  today.  Concerns regarding medicines are outlined above.  Orders Placed This Encounter  Procedures   EKG 12-Lead   No orders of the defined types were placed in this encounter.    Signed, Lars Mage, MD, [facility], [facility] 12/20/2021 11:32 AM    Electrophysiology [facility]

## 2021-12-20 ENCOUNTER — Encounter: Payer: Self-pay | Admitting: Cardiology

## 2021-12-20 ENCOUNTER — Other Ambulatory Visit: Payer: Self-pay

## 2021-12-20 ENCOUNTER — Ambulatory Visit (INDEPENDENT_AMBULATORY_CARE_PROVIDER_SITE_OTHER): Payer: Medicare Other | Admitting: Cardiology

## 2021-12-20 VITALS — BP 124/70 | HR 64 | Ht 65.5 in | Wt 169.4 lb

## 2021-12-20 DIAGNOSIS — I480 Paroxysmal atrial fibrillation: Secondary | ICD-10-CM

## 2021-12-20 DIAGNOSIS — Z8673 Personal history of transient ischemic attack (TIA), and cerebral infarction without residual deficits: Secondary | ICD-10-CM

## 2021-12-20 DIAGNOSIS — I10 Essential (primary) hypertension: Secondary | ICD-10-CM

## 2021-12-20 NOTE — Patient Instructions (Addendum)
Medication Instructions:  Decrease Verapamil to 20 mg daily for 7 days then stop  Your physician recommends that you continue on your current medications as directed. Please refer to the Current Medication list given to you today. *If you need a refill on your cardiac medications before your next appointment, please call your pharmacy*  Lab Work: None. If you have labs (blood work) drawn today and your tests are completely normal, you will receive your results only by: [place] (if you have MyChart) OR A paper copy in the mail If you have any lab test that is abnormal or we need to change your treatment, we will call you to review the results.  Testing/Procedures: None.  Follow-Up: At [facility], you and your health needs are our priority.  As part of our continuing mission to provide you with exceptional heart care, we have created designated Provider Care Teams.  These Care Teams include your primary Cardiologist (physician) and Advanced Practice Providers (APPs -  Physician Assistants and Nurse Practitioners) who all work together to provide you with the care you need, when you need it.  Your physician wants you to follow-up in: 8 months with Lars Mage, MD     You will receive a reminder letter in the mail two months in advance. If you don't receive a letter, please call our office to schedule the follow-up appointment.  We recommend signing up for the patient portal called "MyChart".  Sign up information is provided on this After Visit Summary.  MyChart is used to connect with patients for Virtual Visits (Telemedicine).  Patients are able to view lab/test results, encounter notes, upcoming appointments, etc.  Non-urgent messages can be sent to your provider as well.   To learn more about what you can do with MyChart, go to [email or web address].    Any Other Special Instructions Will Be Listed Below (If Applicable).

## 2021-12-24 ENCOUNTER — Telehealth: Payer: Self-pay | Admitting: *Deleted

## 2021-12-24 NOTE — Telephone Encounter (Signed)
Patient with diagnosis of afib on Eliquis for anticoagulation.    Procedure: endoscopy/colonoscopy Date of procedure: 01/02/22  CHA2DS2-VASc Score = 7  This indicates a 11.2% annual risk of stroke. The patient's score is based upon: CHF History: 0 HTN History: 1 Diabetes History: 0 Stroke History: 2 Vascular Disease History: 1 Age Score: 2 Gender Score: 1   Underwent afib ablation 09/17/21. Pending watchman procedure but delaying given upcoming procedures (this as well as TKA).  CrCl 80mL/min using adjusted body weight Platelet count 264K  Per office protocol, recommend that patient hold Eliquis for 1 day prior to procedure due to elevated CV risk and resume as soon as safely possible.

## 2021-12-24 NOTE — Telephone Encounter (Signed)
° °  Primary Cardiologist: Buford Dresser, MD  Chart reviewed as part of pre-operative protocol coverage. Given past medical history and time since last visit, based on ACC/AHA guidelines, Kristin Lowe would be at acceptable risk for the planned procedure without further cardiovascular testing.   Patient with diagnosis of afib on Eliquis for anticoagulation.     Procedure: endoscopy/colonoscopy Date of procedure: 01/02/22   CHA2DS2-VASc Score = 7  This indicates a 11.2% annual risk of stroke. The patient's score is based upon: CHF History: 0 HTN History: 1 Diabetes History: 0 Stroke History: 2 Vascular Disease History: 1 Age Score: 2 Gender Score: 1   Underwent afib ablation 09/17/21. Pending watchman procedure but delaying given upcoming procedures (this as well as TKA).   CrCl 59mL/min using adjusted body weight Platelet count 264K   Per office protocol, recommend that patient hold Eliquis for 1 day prior to procedure due to elevated CV risk and resume as soon as safely possible.  I will route this recommendation to the requesting party via Epic fax function and remove from pre-op pool.  Please call with questions.  Jossie Ng. Shiasia Porro NP-C    12/24/2021, 4:01 PM [facility] [place] [address] Office [phone] Fax [phone]

## 2021-12-24 NOTE — Telephone Encounter (Signed)
[facility] Pre-operative Risk Assessment     Request for surgical clearance:     Endoscopy Procedure  What type of surgery is being performed?     Endoscopy/colonoscopy  When is this surgery scheduled?     Thursday 01/02/22  What type of clearance is required ?   Pharmacy  Are there any medications that need to be held prior to surgery and how long? Eliquis 2 days  Practice name and name of physician performing surgery?      [facility]  What is your office phone and fax number?      Phone- [phone]  [phone]  Anesthesia type (None, local, MAC, general) ?       MAC

## 2021-12-25 ENCOUNTER — Telehealth: Payer: Self-pay | Admitting: Gastroenterology

## 2021-12-25 ENCOUNTER — Telehealth: Payer: Self-pay

## 2021-12-25 NOTE — Telephone Encounter (Signed)
Called patient and informed her to hold Eliquis 2 days prior to her procedure on 01/02/22. Patient stated understanding and had no questions at the end of call.

## 2021-12-25 NOTE — Telephone Encounter (Signed)
[facility] Pre-operative Risk Assessment     Request for surgical clearance:     Endoscopy Procedure  What type of surgery is being performed?   EGD/Colonoscopy  When is this surgery scheduled?     01/02/22  What type of clearance is required ?   Pharmacy  Are there any medications that need to be held prior to surgery and how long?   Eliquis - 2 days  Practice name and name of physician performing surgery?      [place] - [facility]  What is your office phone and fax number?      Phone- [phone]  [phone]  Anesthesia type (None, local, MAC, general) ?       MAC

## 2021-12-25 NOTE — Telephone Encounter (Signed)
Sent clearance letter to [facility] for approval to Hold Eliquis for 2 days prior to Colonoscopy on 01/02/22. The 90 days that patient needed to be on Eliquis before it might be able to be held was 12/18/21.

## 2021-12-25 NOTE — Telephone Encounter (Signed)
Called patient back and answered her questions about the prep. Let her know we will call her when we hear back from her cardiologist about the Eliquis.

## 2021-12-25 NOTE — Telephone Encounter (Signed)
Inbound call from patient with questions about prep instructions and eliquis

## 2021-12-26 NOTE — Telephone Encounter (Signed)
Clinical pharmacist to review Eliquis 

## 2021-12-26 NOTE — Telephone Encounter (Signed)
° ° °  Patient Name: Kristin Lowe  DOB: 09-04-1941 MRN: [id number]  Primary Cardiologist: Buford Dresser, MD  Chart reviewed as part of pre-operative protocol coverage. Given past medical history and time since last visit, based on ACC/AHA guidelines, [name] would be at acceptable risk for the planned procedure without further cardiovascular testing.   The patient was advised that if she develops new symptoms prior to surgery to contact our office to arrange for a follow-up visit, and she verbalized understanding.  Patient may hold Eliquis for 2 days prior to the procedure and restart as soon as possible afterward at the GI doctor's discretion.  I will route this recommendation to the requesting party via Epic fax function and remove from pre-op pool.  Please call with questions.  [place], [place] 12/26/2021, 2:17 PM

## 2021-12-26 NOTE — Telephone Encounter (Signed)
Patient with diagnosis of afib on Eliquis for anticoagulation.    Procedure:  EGD/Colonoscopy Date of procedure: 01/02/22  CHA2DS2-VASc Score = 7   This indicates a 11.2% annual risk of stroke. The patient's score is based upon: CHF History: 0 HTN History: 1 Diabetes History: 0 Stroke History: 2 Vascular Disease History: 1 Age Score: 2 Gender Score: 1      CrCl 63 ml/min  Patient is post afib ablation 09/17/21. She has completed her 90 days of uninterrupted anticoagulation. Pt has not had any fib since ablation.   Patient may hold Eliquis 2 days prior to procedure.  Pt previously approved by Dr. [name] to hold > 1 day.

## 2022-01-02 ENCOUNTER — Ambulatory Visit (AMBULATORY_SURGERY_CENTER): Payer: Medicare Other | Admitting: Gastroenterology

## 2022-01-02 ENCOUNTER — Encounter: Payer: Self-pay | Admitting: Gastroenterology

## 2022-01-02 ENCOUNTER — Telehealth: Payer: Self-pay

## 2022-01-02 ENCOUNTER — Other Ambulatory Visit: Payer: Self-pay

## 2022-01-02 VITALS — BP 128/64 | HR 60 | Temp 97.9°F | Resp 12 | Ht 65.5 in | Wt 169.0 lb

## 2022-01-02 DIAGNOSIS — K2951 Unspecified chronic gastritis with bleeding: Secondary | ICD-10-CM | POA: Diagnosis not present

## 2022-01-02 DIAGNOSIS — M6289 Other specified disorders of muscle: Secondary | ICD-10-CM

## 2022-01-02 DIAGNOSIS — K5730 Diverticulosis of large intestine without perforation or abscess without bleeding: Secondary | ICD-10-CM | POA: Diagnosis not present

## 2022-01-02 DIAGNOSIS — R194 Change in bowel habit: Secondary | ICD-10-CM

## 2022-01-02 DIAGNOSIS — K633 Ulcer of intestine: Secondary | ICD-10-CM | POA: Diagnosis not present

## 2022-01-02 DIAGNOSIS — R053 Chronic cough: Secondary | ICD-10-CM

## 2022-01-02 DIAGNOSIS — K219 Gastro-esophageal reflux disease without esophagitis: Secondary | ICD-10-CM | POA: Diagnosis not present

## 2022-01-02 DIAGNOSIS — K2970 Gastritis, unspecified, without bleeding: Secondary | ICD-10-CM

## 2022-01-02 DIAGNOSIS — K639 Disease of intestine, unspecified: Secondary | ICD-10-CM | POA: Diagnosis not present

## 2022-01-02 MED ORDER — SODIUM CHLORIDE 0.9 % IV SOLN: 500.0000 mL | Freq: Once | INTRAVENOUS | Status: DC

## 2022-01-02 NOTE — Progress Notes (Signed)
Pt's states no medical or surgical changes since previsit or office visit.  VS CW  

## 2022-01-02 NOTE — Telephone Encounter (Signed)
-----   Message from Thornton Park, MD sent at 01/02/2022  9:51 AM EST ----- Referral for anorectal manometry: Suspected pelvic floor dysfunction  Thank you.  KLB

## 2022-01-02 NOTE — Telephone Encounter (Signed)
Pt has been scheduled for Anorectal Manometry at [facility] on 04/16/22 @ 830am, arrival time 8am. Amb referral placed for auth purposes. Prep instructions sent via My Chart.

## 2022-01-02 NOTE — Op Note (Signed)
[place] Patient Name: Kristin Lowe Procedure Date: 01/02/2022 8:58 AM MRN: [id number] Endoscopist: Thornton Park MD, MD Age: 81 Referring MD:  Date of Birth: July 30, 1941 Gender: Female Account #: [id number] Procedure:                Colonoscopy Indications:              Change in bowel habits Medicines:                Monitored Anesthesia Care Procedure:                Pre-Anesthesia Assessment:                           - Prior to the procedure, a History and Physical                            was performed, and patient medications and                            allergies were reviewed. The patient's tolerance of                            previous anesthesia was also reviewed. The risks                            and benefits of the procedure and the sedation                            options and risks were discussed with the patient.                            All questions were answered, and informed consent                            was obtained. Prior Anticoagulants: The patient has                            taken Eliquis (apixaban), last dose was 3 days                            prior to procedure. ASA Grade Assessment: II - A                            patient with mild systemic disease. After reviewing                            the risks and benefits, the patient was deemed in                            satisfactory condition to undergo the procedure.                           After obtaining informed consent, the colonoscope  was passed under direct vision. Throughout the                            procedure, the patient's blood pressure, pulse, and                            oxygen saturations were monitored continuously. The                            CF [id number] #[id number] was introduced through the anus                            and advanced to the 3 cm into the ileum. A second                            forward view of the  right colon was performed. The                            colonoscopy was performed without difficulty. The                            patient tolerated the procedure well. The quality                            of the bowel preparation was excellent. The                            terminal ileum, ileocecal valve, appendiceal                            orifice, and rectum were photographed. Scope In: 9:19:31 AM Scope Out: 9:35:13 AM Scope Withdrawal Time: 0 hours 11 minutes 55 seconds  Total Procedure Duration: 0 hours 15 minutes 42 seconds  Findings:                 The perianal and digital rectal examinations were                            normal.                           Multiple small and large-mouthed diverticula were                            found in the sigmoid colon, descending colon and                            ascending colon.                           A small single (solitary) ulcer was found in the                            mid ascending colon. No bleeding was present.  Biopsies were taken with a cold forceps for                            histology. The remainder of the examined colonic                            mucosa appeared normal. Estimated blood loss was                            minimal.                           The terminal ileum appeared normal.                           The exam was otherwise without abnormality on                            direct and retroflexion views. Complications:            No immediate complications. Estimated blood loss:                            Minimal. Estimated Blood Loss:     Estimated blood loss was minimal. Impression:               - Diverticulosis in the sigmoid colon, in the                            descending colon and in the ascending colon.                           - A small solitary ulcer the mid ascending colon.                            Biopsied.                           - The  examined portion of the ileum was normal.                           - The examination was otherwise normal on direct                            and retroflexion views. Recommendation:           - Patient has a contact number available for                            emergencies. The signs and symptoms of potential                            delayed complications were discussed with the                            patient. Return to normal activities tomorrow.  Written discharge instructions were provided to the                            patient.                           - High fiber diet.                           - Continue present medications.                           - Await pathology results.                           - Surveillance colonoscopy is not recommended due                            to current age (54 years or older) for surveillance.                           - Consider referral for anorectal manometry for                            further evaluation of sense of incomplete                            evacuation.                           - Resume Eliquis (apixaban) at prior dose tomorrow. Thornton Park MD, MD 01/02/2022 9:45:00 AM This report has been signed electronically.

## 2022-01-02 NOTE — Patient Instructions (Addendum)
Thank you for allowing [place] to care for you today. Await final biopsy results, approximately 2 weeks.  No further routine colonoscopies recommended based on current age guidelines. Resume medications and diet today.  Recommend adding more fiber-filled foods to your diet.  Handout given. Restart Eliquis tomorrow, 01/03/22 at prior dose. Return to normal daily activities tomorrow 01/03/22. Our office will contact you regarding anorectal manometry at a later date at [place]:   Refer to the procedure report that was given to you for any specific questions about what was found during the examination.  If the procedure report does not answer your questions, please call your gastroenterologist to clarify.  If you requested that your care partner not be given the details of your procedure findings, then the procedure report has been included in a sealed envelope for you to review at your convenience later.  YOU SHOULD EXPECT: Some feelings of bloating in the abdomen. Passage of more gas than usual.  Walking can help get rid of the air that was put into your GI tract during the procedure and reduce the bloating. If you had a lower endoscopy (such as a colonoscopy or flexible sigmoidoscopy) you may notice spotting of blood in your stool or on the toilet paper. If you underwent a bowel prep for your procedure, you may not have a normal bowel movement for a few days.  Please Note:  You might notice some irritation and congestion in your nose or some drainage.  This is from the oxygen used during your procedure.  There is no need for concern and it should clear up in a day or so.  SYMPTOMS TO REPORT IMMEDIATELY:  Following lower endoscopy (colonoscopy or flexible sigmoidoscopy):  Excessive amounts of blood in the stool  Significant tenderness or worsening of abdominal pains  Swelling of the abdomen that is new, acute  Fever  of 100F or higher  Following upper endoscopy (EGD)  Vomiting of blood or coffee ground material  New chest pain or pain under the shoulder blades  Painful or persistently difficult swallowing  New shortness of breath  Fever of 100F or higher  Black, tarry-looking stools  For urgent or emergent issues, a gastroenterologist can be reached at any hour by calling [phone]. Do not use MyChart messaging for urgent concerns.    DIET:  We do recommend a small meal at first, but then you may proceed to your regular diet.  Drink plenty of fluids but you should avoid alcoholic beverages for 24 hours.  ACTIVITY:  You should plan to take it easy for the rest of today and you should NOT DRIVE or use heavy machinery until tomorrow (because of the sedation medicines used during the test).    FOLLOW UP: Our staff will call the number listed on your records 48-72 hours following your procedure to check on you and address any questions or concerns that you may have regarding the information given to you following your procedure. If we do not reach you, we will leave a message.  We will attempt to reach you two times.  During this call, we will ask if you have developed any symptoms of COVID 19. If you develop any symptoms (ie: fever, flu-like symptoms, shortness of breath, cough etc.) before then, please call [phone].  If you test positive for Covid 19 in the 2 weeks post procedure, please call and report this  information to [place].    If any biopsies were taken you will be contacted by phone or by letter within the next 1-3 weeks.  Please call us at [phone] if you have not heard about the biopsies in 3 weeks.    SIGNATURES/CONFIDENTIALITY: You and/or your care partner have signed paperwork which will be entered into your electronic medical record.  These signatures attest to the fact that that the information above on your After Visit Summary has been reviewed and is understood.  Full  responsibility of the confidentiality of this discharge information lies with you and/or your care-partner.

## 2022-01-02 NOTE — Op Note (Signed)
[place] Patient Name: Kristin Lowe Procedure Date: 01/02/2022 8:59 AM MRN: [id number] Endoscopist: Thornton Park MD, MD Age: 81 Referring MD:  Date of Birth: Oct 26, 1941 Gender: Female Account #: [id number] Procedure:                Upper GI endoscopy Indications:              Chronic cough, recent change in bowel habits Medicines:                Monitored Anesthesia Care Procedure:                Pre-Anesthesia Assessment:                           - Prior to the procedure, a History and Physical                            was performed, and patient medications and                            allergies were reviewed. The patient's tolerance of                            previous anesthesia was also reviewed. The risks                            and benefits of the procedure and the sedation                            options and risks were discussed with the patient.                            All questions were answered, and informed consent                            was obtained. Prior Anticoagulants: The patient has                            taken Eliquis (apixaban), last dose was 3 days                            prior to procedure. ASA Grade Assessment: II - A                            patient with mild systemic disease. After reviewing                            the risks and benefits, the patient was deemed in                            satisfactory condition to undergo the procedure.                           After obtaining informed consent, the endoscope was  passed under direct vision. Throughout the                            procedure, the patient's blood pressure, pulse, and                            oxygen saturations were monitored continuously. The                            GIF HQ190 #[id number] was introduced through the                            mouth, and advanced to the third part of duodenum.                             The upper GI endoscopy was accomplished without                            difficulty. The patient tolerated the procedure                            well. Scope In: Scope Out: Findings:                 The Z-line was irregular and was found 38 cm from                            the incisors. The distal esophagus has mild                            congestion. Biopsies were obtained from the                            proximal and distal esophagus with cold forceps for                            histology. Estimated blood loss was minimal.                           Localized mild inflammation characterized by                            congestion (edema), erythema, friability and                            granularity was found in the cardia. Biopsies were                            taken from the antrum, body, and fundus with a cold                            forceps for histology. Estimated blood loss was  minimal.                           The examined duodenum was normal. Biopsies were                            taken with a cold forceps for histology. Estimated                            blood loss was minimal. Complications:            No immediate complications. Estimated blood loss:                            Minimal. Estimated Blood Loss:     Estimated blood loss was minimal. Impression:               - Congested distal esophagus. Biopsied.                           - Mild gastritis. Biopsied.                           - Normal examined duodenum. Biopsied. Recommendation:           - Patient has a contact number available for                            emergencies. The signs and symptoms of potential                            delayed complications were discussed with the                            patient. Return to normal activities tomorrow.                            Written discharge instructions were provided to the                             patient.                           - Resume previous diet.                           - Continue present medications.                           - Await pathology results. Thornton Park MD, MD 01/02/2022 9:40:06 AM This report has been signed electronically.

## 2022-01-02 NOTE — Progress Notes (Signed)
Report to PACU, RN, vss, BBS= Clear.  

## 2022-01-02 NOTE — Progress Notes (Signed)
Called to room to assist during endoscopic procedure.  Patient ID and intended procedure confirmed with present staff. Received instructions for my participation in the procedure from the performing physician.  

## 2022-01-02 NOTE — Progress Notes (Signed)
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 female presents for EGD and colonoscopy.  Prior colonoscopy in [place]. She does not remember the results beyond diverticulosis including the name of the performing physician or location of the procedure. She does not remember having colon polyps.    Change in bowel habits over the last year. Lifetime history of early morning BM. After a two week visit to her brother's house, where they ate later in the day, she has had a change in her bowel habits and now has associated abdominal bloating. She is having small, lumpy bowel movements every other day. There is a sense of incomplete evacuation. There is no history of straining, use of digital maneuvers, sensation of anorectal obstruction or blockage with 25 percent of bowel movements. Her bowel habits never returned to normal.   Recently seen by Dr. Melvyn Novas for chronic cough that he thought might be related to reflux. He prescribed two medications including pantoprazole 40 mg daily and famotidine 20 mg with dinner. She wonders if she needs to have an upper endoscopy.   See my 09/23/21 office note for complete details.      Past Medical History:  Diagnosis Date   Bradycardia    Hypertension    Paroxysmal atrial fibrillation ([place])    Stroke (cerebrum) [facility])     Past Surgical History:  Procedure Laterality Date   ATRIAL FIBRILLATION ABLATION N/A 09/17/2021   Procedure: ATRIAL FIBRILLATION  ABLATION;  Surgeon: Vickie Epley, MD;  Location: [facility];  Service: Cardiovascular;  Laterality: N/A;   CATARACT EXTRACTION, BILATERAL     LOOP RECORDER INSERTION N/A 05/17/2018   Procedure: LOOP RECORDER INSERTION;  Surgeon: Deboraha Sprang, MD;  Location: [facility];  Service: Cardiovascular;  Laterality: N/A;   LOOP RECORDER INSERTION  05/17/2018   Procedure: LOOP RECORDER INSERTION;  Surgeon: Fay Records, MD;  Location: [facility];  Service: Cardiovascular;;   TEE WITHOUT CARDIOVERSION N/A 05/17/2018   Procedure: TRANSESOPHAGEAL ECHOCARDIOGRAM (TEE);  Surgeon: Fay Records, MD;  Location: [facility] ENDOSCOPY;  Service: Cardiovascular;  Laterality: N/A;   TRIGGER FINGER RELEASE     TUMOR REMOVAL      Current Outpatient Medications  Medication Sig Dispense Refill   apixaban (ELIQUIS) 5 MG TABS tablet Take 1 tablet (5 mg total) by mouth 2 (two) times daily. Overdue for labwork, MUST have repeat labwork at office visit prior to FUTURE refills. 180 tablet 0   atorvastatin (LIPITOR) 40 MG tablet Take 1 tablet (40 mg total) by mouth daily at 6 PM. 30 tablet 3   cholecalciferol (VITAMIN D3) 25 MCG (1000 UNIT) tablet Take 1,000 Units by mouth daily.     co-enzyme Q-10 50 MG capsule Take 100 mg by mouth daily.     famotidine (PEPCID) 20 MG tablet One after supper (Patient taking differently: Take 20 mg by mouth daily with supper.) 30 tablet 11   pantoprazole (  PROTONIX) 40 MG tablet TAKE 1 TABLET BY MOUTH DAILY. TAKE 30-60 MIN BEFORE FIRST MEAL OF THE DAY 30 tablet 2   Triamcinolone Acetonide (NASACORT AQ NA) Place 1 spray into both nostrils daily as needed (allergies).      Turmeric 500 MG CAPS Take 1,000 mg by mouth daily.     acetaminophen (TYLENOL) 325 MG tablet Take 325 mg by mouth every 6 (six) hours as needed for moderate pain or fever.     clobetasol ointment (TEMOVATE) 6.83 % Apply 1 application topically daily as needed (skin irritation).     DHA-EPA-Vitamin E (OMEGA-3  COMPLEX PO) Take 1 capsule by mouth at bedtime.     Multiple Vitamins-Minerals (IMMUNE SUPPORT PO) Take 7 tablets by mouth at bedtime. Id Life  PM Package Omega 3 complex Vit d3 plus cofactors Advance pm Axitant plus     OVER THE COUNTER MEDICATION Take 1 Package by mouth daily. Multivitamin D life Vitamin pack (morning Pack)  Spirulina/chlorella Advance AM     Current Facility-Administered Medications  Medication Dose Route Frequency Provider Last Rate Last Admin   0.9 %  sodium chloride infusion  500 mL Intravenous Once Thornton Park, MD        Allergies as of 01/02/2022   (No Known Allergies)    Family History  Problem Relation Age of Onset   Breast cancer Mother    Diabetes Mother    Thyroid disease Mother    Heart disease Father    Hypertension Sister    Thyroid disease Sister    Diabetes Brother    Hypertension Brother      Physical Exam: General:   Alert,  well-nourished, pleasant and cooperative in NAD Head:  Normocephalic and atraumatic. Eyes:  Sclera clear, no icterus.   Conjunctiva pink. Mouth:  No deformity or lesions.   Neck:  Supple; no masses or thyromegaly. Lungs:  Clear throughout to auscultation.   No wheezes. Heart:  Regular rate and rhythm; no murmurs. Abdomen:  Soft, non-tender, nondistended, normal bowel sounds, no rebound or guarding.  Msk:  Symmetrical. No boney deformities LAD: No inguinal or umbilical LAD Extremities:  No clubbing or edema. Neurologic:  Alert and  oriented x4;  grossly nonfocal Skin:  No obvious rash or bruise. Psych:  Alert and cooperative. Normal mood and affect.     Erika Slaby L. Tarri Glenn, MD, MPH 01/02/2022, 8:55 AM

## 2022-01-03 NOTE — Telephone Encounter (Signed)
Called pt to advise about date/time/arrival time and location of exam. Also advise prep instructions have been sent via [place]. Verbalized acceptance and understanding.

## 2022-01-06 ENCOUNTER — Telehealth: Payer: Self-pay

## 2022-01-06 ENCOUNTER — Telehealth: Payer: Self-pay | Admitting: *Deleted

## 2022-01-06 DIAGNOSIS — M8589 Other specified disorders of bone density and structure, multiple sites: Secondary | ICD-10-CM | POA: Diagnosis not present

## 2022-01-06 NOTE — Telephone Encounter (Signed)
°  Follow up Call-  Call back number 01/02/2022  Post procedure Call Back phone  # [phone]  Permission to leave phone message Yes  Some recent data might be hidden     Patient questions:  Do you have a fever, pain , or abdominal swelling? No. Pain Score  0 *  Have you tolerated food without any problems? Yes.    Have you been able to return to your normal activities? Yes.    Do you have any questions about your discharge instructions: Diet   No. Medications  No. Follow up visit  No.  Do you have questions or concerns about your Care? No.  Actions: * If pain score is 4 or above: No action needed, pain <4.

## 2022-01-06 NOTE — Telephone Encounter (Signed)
First attempt follow up call to pt, lm for pt to call if having any problems or questions, otherwise we will call them back later this morning or early this afternoon.  °

## 2022-01-07 DIAGNOSIS — H52203 Unspecified astigmatism, bilateral: Secondary | ICD-10-CM | POA: Diagnosis not present

## 2022-01-07 DIAGNOSIS — Z961 Presence of intraocular lens: Secondary | ICD-10-CM | POA: Diagnosis not present

## 2022-01-07 DIAGNOSIS — H43813 Vitreous degeneration, bilateral: Secondary | ICD-10-CM | POA: Diagnosis not present

## 2022-01-14 ENCOUNTER — Encounter: Payer: Self-pay | Admitting: Gastroenterology

## 2022-01-16 ENCOUNTER — Encounter: Payer: Self-pay | Admitting: Gastroenterology

## 2022-01-22 ENCOUNTER — Ambulatory Visit (INDEPENDENT_AMBULATORY_CARE_PROVIDER_SITE_OTHER): Payer: Medicare Other | Admitting: Cardiology

## 2022-01-22 ENCOUNTER — Other Ambulatory Visit: Payer: Self-pay

## 2022-01-22 ENCOUNTER — Encounter: Payer: Self-pay | Admitting: Cardiology

## 2022-01-22 VITALS — BP 120/70 | HR 70 | Ht 65.5 in | Wt 170.0 lb

## 2022-01-22 DIAGNOSIS — I480 Paroxysmal atrial fibrillation: Secondary | ICD-10-CM | POA: Diagnosis not present

## 2022-01-22 DIAGNOSIS — Z8673 Personal history of transient ischemic attack (TIA), and cerebral infarction without residual deficits: Secondary | ICD-10-CM | POA: Diagnosis not present

## 2022-01-22 NOTE — Patient Instructions (Signed)
Medication Instructions:  ?Your physician recommends that you continue on your current medications as directed. Please refer to the Current Medication list given to you today. ? ?Labwork: ?None ordered. ? ?Testing/Procedures: ?None ordered. ? ?Follow-Up: As needed with Dr. Quentin Ore.  ? ? ? ?Implantable Loop Recorder Removal, Care After ?This sheet gives you information about how to care for yourself after your procedure. Your health care provider may also give you more specific instructions. If you have problems or questions, contact your health care provider. ?What can I expect after the procedure? ?After the procedure, it is common to have: ?Soreness or discomfort near the incision. ?Some swelling or bruising near the incision. ? ?Follow these instructions at home: ?Incision care ? ? Leave your outer dressing on for 72 hours.  After 72 hours you can remove your outer dressing and shower. ?Leave adhesive strips in place. These skin closures may need to stay in place for 1-2 weeks. If adhesive strip edges start to loosen and curl up, you may trim the loose edges.  You may remove the strips if they have not fallen off after 2 weeks. ?Check your incision area every day for signs of infection. Check for: ?Redness, swelling, or pain. ?Fluid or blood. ?Warmth. ?Pus or a bad smell. ?Do not take baths, swim, or use a hot tub until your incision is completely healed. ?If your wound site starts to bleed apply pressure.   ?   ?If you have any questions/concerns please call the device clinic at [phone]. ? ?Activity ? ?Return to your normal activities. ? ?Contact a health care provider if: ?You have redness, swelling, or pain around your incision. ?You have a fever.  ?

## 2022-01-22 NOTE — Progress Notes (Signed)
Electrophysiology Office Follow up Visit Note:    Date:  01/22/2022   ID:  Kristin Lowe, DOB September 15, 1941, MRN [id number]  PCP:  [name], MD  [facility] Cardiologist:  Buford Dresser, MD  [facility] Electrophysiologist:  Vickie Epley, MD    Interval History:    Kristin Lowe is a 81 y.o. female who presents for a follow up visit. They were last seen in clinic 12/20/2021.  Today she also presents for ILR removal.  Overall, she is feeling good. Her smart watch has not detected any recurring arrhythmias. She reports feeling some discomfort in certain positions while sleeping due to her ILR.  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 A player collided with her and she suffered injuries in her left chest and LUE. Currently she complains of left rib pain and presents ecchymosis of her LUE.  She remains compliant with her Eliquis, and she took her dose this morning.  She has been using her BiPAP. However, she still feels tired in the morning when she wakes up.  She denies any palpitations, shortness of breath, or peripheral edema. No lightheadedness, headaches, syncope, orthopnea, or PND.      Past Medical History:  Diagnosis Date   Bradycardia    Hypertension    Paroxysmal atrial fibrillation ([place])    Stroke (cerebrum) [facility])     Past Surgical History:  Procedure Laterality Date   ATRIAL FIBRILLATION ABLATION N/A 09/17/2021   Procedure: ATRIAL FIBRILLATION ABLATION;  Surgeon: Vickie Epley, MD;  Location: [place] [facility];  Service: Cardiovascular;  Laterality: N/A;   CATARACT EXTRACTION, BILATERAL     LOOP RECORDER INSERTION N/A 05/17/2018   Procedure: LOOP RECORDER INSERTION;  Surgeon: Deboraha Sprang, MD;  Location: [place] [facility];  Service: Cardiovascular;  Laterality: N/A;   LOOP RECORDER INSERTION  05/17/2018   Procedure: LOOP RECORDER INSERTION;  Surgeon: Fay Records, MD;  Location: [place];  Service: Cardiovascular;;   TEE WITHOUT CARDIOVERSION N/A 05/17/2018   Procedure: TRANSESOPHAGEAL ECHOCARDIOGRAM (TEE);  Surgeon: Fay Records, MD;  Location: [facility] ENDOSCOPY;  Service: Cardiovascular;  Laterality: N/A;   TRIGGER FINGER RELEASE     TUMOR REMOVAL      Current Medications: Current Meds  Medication Sig   acetaminophen (TYLENOL) 325 MG tablet Take 325 mg by mouth every 6 (six) hours as needed for moderate pain or fever.   apixaban (ELIQUIS) 5 MG TABS tablet Take 1 tablet (5 mg total) by mouth 2 (two) times daily. Overdue for labwork, MUST have repeat labwork at office visit prior to FUTURE refills.   atorvastatin (LIPITOR) 40 MG tablet Take 1 tablet (40 mg total) by mouth daily at 6 PM.   cholecalciferol (VITAMIN D3) 25 MCG (1000 UNIT) tablet Take 1,000 Units by mouth daily.   clobetasol ointment (TEMOVATE) 5.17 % Apply 1 application topically daily as needed (skin irritation).   co-enzyme Q-10 50 MG capsule Take 100 mg by mouth daily.   DHA-EPA-Vitamin E (OMEGA-3 COMPLEX PO) Take 1 capsule by mouth at bedtime.   famotidine (PEPCID) 20 MG tablet One after supper   Multiple Vitamins-Minerals (IMMUNE SUPPORT PO) Take 7 tablets by mouth at bedtime. Id Life  PM Package Omega 3 complex Vit d3 plus cofactors Advance pm Axitant plus   OVER THE COUNTER MEDICATION Take 1 Package by mouth daily. Multivitamin D life Vitamin pack (morning Pack)  Spirulina/chlorella Advance AM   pantoprazole (PROTONIX) 40 MG  tablet TAKE 1 TABLET BY MOUTH DAILY. TAKE 30-60 MIN BEFORE FIRST MEAL OF THE DAY   Triamcinolone Acetonide (NASACORT AQ NA) Place 1 spray into both nostrils daily as needed (allergies).    Turmeric 500 MG CAPS Take 1,000 mg by mouth daily.     Allergies:   Patient has no known allergies.   Social History   Socioeconomic History   Marital status: Single    Spouse name: Not on file   Number of children: Not on file   Years of education: Not on file   Highest  education level: Not on file  Occupational History   Not on file  Tobacco Use   Smoking status: Former    Packs/day: 2.00    Years: 30.00    Pack years: 60.00    Types: Cigarettes    Quit date: 11/24/1990    Years since quitting: 31.1   Smokeless tobacco: Never  Vaping Use   Vaping Use: Never used  Substance and Sexual Activity   Alcohol use: No   Drug use: Never   Sexual activity: Not on file  Other Topics Concern   Not on file  Social History Narrative   Not on file   Social Determinants of Health   Financial Resource Strain: Not on file  Food Insecurity: Not on file  Transportation Needs: Not on file  Physical Activity: Not on file  Stress: Not on file  Social Connections: Not on file     Family History: The patient's family history includes Breast cancer in her mother; Diabetes in her brother and mother; Heart disease in her father; Hypertension in her brother and sister; Thyroid disease in her mother and sister.  ROS:   Please see the history of present illness.    All other systems reviewed and are negative.  EKGs/Labs/Other Studies Reviewed:    The following studies were reviewed today:  Atrial Fibrillation Ablation 09/17/2021: CONCLUSIONS: 1. Successful PVI 2. Intracardiac echo reveals trivial pericardial effusion, 4 PV's 3. No early apparent complications.  Cardiac CTA 09/10/2021: FINDINGS: Image quality: Good.   Noise artifact is: Limited.   Left Atrium: Mildly dilated.  No ASD.   There is normal pulmonary vein drainage into the left atrium (2 on the right and 2 on the left) with ostial measurements as follows:   RUPV: 22.6 x 18.9 mm, area 3.21 cm2   RLPV: 23.6 x 20.8 mm, area 3.84 cm2   LUPV: 19.2 x 15.5 mm, area 2.27 cm2   LLPV: 18.9 x 14.3 mm, area 2.05 cm2   No anomalous pulmonary venous drainage. No pulmonary vein stenosis.   The esophagus runs adjacent to the left sided pulmonary veins.   Left Atrial Appendage:   Morphology: The  left atrial appendage is large chicken wing type with two lobes. The distal lobe has a superior/posteromedial angulation. Thrombus: There is no thrombus in the left atrial appendage on contrast or delayed imaging.   The following measurements were made regarding left atrial appendage closure:   Phase assessed: 25%   Landing Zone measurement: 20.5 x 19.0 mm   LAA Length (maximum): 16.5 mm   Optimal interatrial septum puncture site: inferior/posterior   Optimal deployment angle:RAO 54 CAU 34   Catheter: A double curve catheter is recommended.   Watchman FLX Device: A 24 mm device is recommended with 15% compression.   Other comments: Trabeculation is noted anterior and inferior to the maximum appendage length measurement - recommend procedural caution to avoid.   Coronary  Arteries: Normal coronary origin. Right dominance. The study was performed without use of NTG and insufficient for plaque evaluation, however coronary arteries appear . Coronary artery calcium score is 10, which is 24th percentile for age and sex matched peers.   Right Atrium: Right atrial size is mildly dilated.   Right Ventricle: The right ventricular cavity is within normal limits.   Left Ventricle: The ventricular cavity size is within normal limits. There are no stigmata of prior infarction. There is no abnormal filling defect.   Pulmonary Artery: Normal caliber without proximal filling defect.   Aortic valve: Aortic valve is trileaflet without significant calcification.   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 Mitral valve appears thickened.   Aorta: Normal caliber with no significant disease. 33 mm at mid ascending aorta.   Pericardium: Normal thickness with no significant effusion or calcium present.   Extra-cardiac findings: See attached radiology report for non-cardiac structures.   IMPRESSION: 1. There is normal pulmonary vein drainage  into the left atrium. No pulmonary vein stenosis.   2. Normal left atrial appendage, no left atrial appendage thrombus. No intracardiac mass or thrombus.   3. The esophagus runs adjacent to the left sided pulmonary veins.   4. The left atrial appendage is a large chicken wing morphology without thrombus.   5. A 10mm Watchman FLX device is recommended based on the above landing zone measurements (20.5 mm maximum diameter; 15 % compression, max length 16.5 mm).   6. An inferior posterior IAS puncture site is recommended.   7. Optimal deployment angle: RAO 54 CAU 34   8. Normal coronary origin. Right dominance. Coronary artery calcium score is 10, which is 24th percentile for age and sex matched peers.   9. 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 Mitral valve is thickened.  Echo 08/02/2021:  1. Left ventricular ejection fraction, by estimation, is 65 to 70%. Left  ventricular ejection fraction by 3D volume is 68 %. The left ventricle has  normal function. The left ventricle has no regional wall motion  abnormalities. Left ventricular diastolic   parameters are consistent with Grade I diastolic dysfunction (impaired  relaxation). The average left ventricular global longitudinal strain is  -22.5 %. The global longitudinal strain is normal.   2. Normal RV free wall strain. Right ventricular systolic function is  hyperdynamic. The right ventricular size is normal.   3. Normal LA resevoir strain.   4. The mitral valve is grossly normal. Trivial mitral valve  regurgitation.   5. The aortic valve is tricuspid. Aortic valve regurgitation is not  visualized.   Comparison(s): Changes from prior study are noted. 05/16/2018: LVEF 55-60%, mild MR, negative bubble study.      Recent Labs: 07/12/2021: ALT 19; TSH 0.564 09/03/2021: BUN 18; Creatinine, Ser 0.75; Potassium 4.5; Sodium 139 09/11/2021: Hemoglobin 12.8; Platelets 264.0   Recent Lipid Panel     Component Value Date/Time   CHOL 182 05/15/2018 2119   TRIG 115 05/15/2018 2119   HDL 53 05/15/2018 2119   CHOLHDL 3.4 05/15/2018 2119   VLDL 23 05/15/2018 2119   LDLCALC 106 (H) 05/15/2018 2119    Physical Exam:    VS:  BP 120/70 (BP Location: Left Arm, Patient Position: Sitting, Cuff Size: Normal)    Pulse 70    Ht 5' 5.5" (1.664 m)    Wt 170 lb (77.1 kg)    SpO2 96%    BMI 27.86 kg/m  Wt Readings from Last 3 Encounters:  01/22/22 170 lb (77.1 kg)  01/02/22 169 lb (76.7 kg)  12/20/21 169 lb 6.4 oz (76.8 kg)     GEN: Well nourished, well developed in no acute distress HEENT: Normal NECK: No JVD; No carotid bruits LYMPHATICS: No lymphadenopathy CARDIAC: RRR, no murmurs, rubs, gallops RESPIRATORY:  Clear to auscultation without rales, wheezing or rhonchi  ABDOMEN: Soft, non-tender, non-distended MUSCULOSKELETAL:  No edema; No deformity  SKIN: Warm and dry NEUROLOGIC:  Alert and oriented x 3 PSYCHIATRIC:  Normal affect        ASSESSMENT:    1. History of CVA (cerebrovascular accident)   2. Paroxysmal atrial fibrillation (HCC)    PLAN:    In order of problems listed above:  #Paroxysmal atrial fibrillation Maintaining sinus rhythm after her ablation procedure.  Still taking Eliquis 5 mg by mouth twice daily.  #Loop recorder in situ Device associated with some pain that is positional.  She would like to remove the loop recorder which I think is very reasonable.  I discussed the loop recorder explant procedure in detail with the patient and she wishes to proceed.  Medication Adjustments/Labs and Tests Ordered: Current medicines are reviewed at length with the patient today.  Concerns regarding medicines are outlined above.  No orders of the defined types were placed in this encounter.  No orders of the defined types were placed in this encounter.   I,Mathew Stumpf,acting as a Education administrator for Vickie Epley, MD.,have documented all relevant documentation on the  behalf of Vickie Epley, MD,as directed by  Vickie Epley, MD while in the presence of Vickie Epley, MD.  I, Vickie Epley, MD, have reviewed all documentation for this visit. The documentation on 01/22/22 for the exam, diagnosis, procedures, and orders are all accurate and complete.   Signed, Lars Mage, MD, [facility], [address] 01/22/2022 5:05 PM    Electrophysiology [facility]     ---------------------------------------------------------- SURGEON:  Lars Mage, MD    PREPROCEDURE DIAGNOSIS:  Atrial fibrillation     POSTPROCEDURE DIAGNOSIS:  Atrial fibrillation      PROCEDURES:   1. Implantable loop recorder explantation    INTRODUCTION:  URIEL HORKEY is a 81 y.o. female with a history of atrial fibrillation who presents today for implantable loop explantation.      DESCRIPTION OF PROCEDURE:  Informed written consent was obtained.  The patient required no sedation for the procedure today.   The patients left chest was therefore prepped and draped in the usual sterile fashion.  The skin overlying the ILR monitor was infiltrated with lidocaine for local analgesia.  A 0.5-cm incision was made over the site.  The previously implanted ILR was exposed and removed using a combination of sharp and blunt dissection.  Steri- Strips and a sterile dressing were then applied. EBL<57ml.  There were no early apparent complications.     CONCLUSIONS:   1. Successful explantation of a [employer] implantable loop recorder   2. No early apparent complications.       Lars Mage, MD 01/22/2022 5:05 PM

## 2022-02-03 DIAGNOSIS — J383 Other diseases of vocal cords: Secondary | ICD-10-CM | POA: Diagnosis not present

## 2022-02-03 DIAGNOSIS — J384 Edema of larynx: Secondary | ICD-10-CM | POA: Diagnosis not present

## 2022-02-03 DIAGNOSIS — J387 Other diseases of larynx: Secondary | ICD-10-CM | POA: Diagnosis not present

## 2022-02-03 DIAGNOSIS — R490 Dysphonia: Secondary | ICD-10-CM | POA: Diagnosis not present

## 2022-02-03 DIAGNOSIS — R053 Chronic cough: Secondary | ICD-10-CM | POA: Diagnosis not present

## 2022-02-03 DIAGNOSIS — L539 Erythematous condition, unspecified: Secondary | ICD-10-CM | POA: Diagnosis not present

## 2022-02-03 DIAGNOSIS — Z87891 Personal history of nicotine dependence: Secondary | ICD-10-CM | POA: Diagnosis not present

## 2022-02-04 ENCOUNTER — Other Ambulatory Visit: Payer: Self-pay | Admitting: Internal Medicine

## 2022-02-04 DIAGNOSIS — R053 Chronic cough: Secondary | ICD-10-CM

## 2022-02-17 ENCOUNTER — Other Ambulatory Visit: Payer: Self-pay

## 2022-02-17 ENCOUNTER — Encounter: Payer: Self-pay | Admitting: Cardiology

## 2022-02-17 MED ORDER — APIXABAN 5 MG PO TABS: 5.0000 mg | ORAL_TABLET | Freq: Two times a day (BID) | ORAL | 1 refills | Status: DC

## 2022-02-17 NOTE — Telephone Encounter (Signed)
Prescription refill request for Eliquis received. ?Indication: Afib  ?Last office visit:01/22/22 Quentin Ore)  ?Scr: 0.8 (11/11/21) ?Age: 81 ?Weight: 77.1kg ? ?Appropriate dose and refill sent to requested pharmacy.  ?

## 2022-02-24 DIAGNOSIS — M1712 Unilateral primary osteoarthritis, left knee: Secondary | ICD-10-CM | POA: Diagnosis not present

## 2022-02-25 ENCOUNTER — Telehealth: Payer: Self-pay | Admitting: *Deleted

## 2022-02-25 ENCOUNTER — Telehealth: Payer: Self-pay

## 2022-02-25 NOTE — Telephone Encounter (Signed)
Pt has been scheduled to see Dr. Harrell Gave, 05/06/22 and clearance will be addressed at that time.  Per pt, June is completely fine, as she wanted to wait a couple to a few months anyway. ? ?Will route back to the requesting surgeon's office to make them aware. ?

## 2022-02-25 NOTE — Telephone Encounter (Signed)
? ?  Name: Kristin Lowe  ?DOB: 1941/03/05  ?MRN: [id number] ? ?Primary Cardiologist: Buford Dresser, MD ? ?Chart reviewed as part of pre-operative protocol coverage. Because of [name]'s past medical history and time since last visit, she will require a follow-up visit in order to better assess preoperative cardiovascular risk. ? ?Pre-op covering staff: ?- Please schedule appointment and call patient to inform them. If patient already had an upcoming appointment within acceptable timeframe, please add "pre-op clearance" to the appointment notes so provider is aware. ?- Please contact requesting surgeon's office via preferred method (i.e, phone, fax) to inform them of need for appointment prior to surgery. ? ?If applicable, this message will also be routed to pharmacy pool and/or primary cardiologist for input on holding anticoagulant/antiplatelet agent as requested below so that this information is available to the clearing provider at time of patient's appointment.  ? ?Will need to follow-up with general cardiologist Dr. Harrell Gave or his APP for cardiac clearance. ? ?[name], [place]  ?02/25/2022, 2:16 PM  ? ?

## 2022-02-25 NOTE — Telephone Encounter (Signed)
? ?  Pre-operative Risk Assessment  ?  ?Patient Name: Kristin Lowe  ?DOB: 1941-11-15 ?MRN: [id number]  ? ?  ? ?Request for Surgical Clearance   ? ?Procedure:   LEFT TOTAL KNEE ARTHROPLASTY ? ?Date of Surgery:  Clearance TBD                              ?   ?Surgeon:  DR. Beryle Lathe COLLINS ?Surgeon's Group or Practice Name:  [facility] ?Phone number:  [phone] ?Fax number:  [phone] ?  ?Type of Clearance Requested:   ?- Medical  ?- Pharmacy:  Hold Apixaban (Eliquis) NOT INDICATED ?  ?Type of Anesthesia:   SPINAL WITH ADDUCTOR  ?  ?Additional requests/questions:   ? ?Signed, ?Jeanann Lewandowsky   ?02/25/2022, 1:54 PM  ? ?

## 2022-02-25 NOTE — Telephone Encounter (Signed)
**Note De-Identified Phillipa Morden Obfuscation** The pts completed BMSPAF application for Eliquis was left at the office. ?I have completed the providers page of her application and have e-mailed it to Dr Claudie Revering nurse so she can obtain his signature, date it, and so she can fax all to [facility] at the fax number written on the cover letter included. ?

## 2022-02-26 NOTE — Telephone Encounter (Signed)
Signed and faxed

## 2022-02-26 NOTE — Telephone Encounter (Signed)
Printed for MD to sign.

## 2022-03-04 NOTE — Telephone Encounter (Signed)
**Note De-Identified Kristin Lowe Obfuscation** Letter received from [facility] stating that thry have approved the pt for asst with Eliquis until 11/23/2022. ?[id number] ? ?The letter states that they have notified the pt of this approval as well. ?

## 2022-03-26 ENCOUNTER — Encounter: Payer: Self-pay | Admitting: Cardiovascular Disease

## 2022-03-26 ENCOUNTER — Ambulatory Visit (INDEPENDENT_AMBULATORY_CARE_PROVIDER_SITE_OTHER): Payer: Medicare Other | Admitting: Cardiovascular Disease

## 2022-03-26 DIAGNOSIS — I480 Paroxysmal atrial fibrillation: Secondary | ICD-10-CM | POA: Diagnosis not present

## 2022-03-26 DIAGNOSIS — R400 Somnolence: Secondary | ICD-10-CM | POA: Diagnosis not present

## 2022-03-26 DIAGNOSIS — I10 Essential (primary) hypertension: Secondary | ICD-10-CM | POA: Diagnosis not present

## 2022-03-26 DIAGNOSIS — G4733 Obstructive sleep apnea (adult) (pediatric): Secondary | ICD-10-CM

## 2022-03-26 DIAGNOSIS — R001 Bradycardia, unspecified: Secondary | ICD-10-CM

## 2022-03-26 DIAGNOSIS — Z8673 Personal history of transient ischemic attack (TIA), and cerebral infarction without residual deficits: Secondary | ICD-10-CM | POA: Diagnosis not present

## 2022-03-26 NOTE — Progress Notes (Signed)
? ?Cardiology Office Note   ? ?Date:  03/26/2022  ? ?ID:  XARA PAULDING, DOB [birth year], MRN [id number] ? ?PCP:  [name], MD  ?Cardiologist:  Shelva Majestic, MD (sleep); Dr. Lars Mage, EP; Dr. Harrell Gave ? ?New sleep evaluation ? ?History of Present Illness:  ?AINSLEIGH KAKOS is a 81 y.o. female who remotely was seen by Dr. [name] and most recently has establish PCP care with Dr. Lars Mage.  At his visit he has a history of paroxysmal atrial fibrillation, remote CVA, hypertension, and is status post atrial fibrillation ablation in October 2022.  Patient also had loop recorder which recently was removed by Dr. Quentin Ore.  Due to concerns for obstructive sleep apnea with symptoms of snoring, daytime sleepiness, fatigue and PAF, she had undergone an initial home sleep study which was done on July 09, 2021.  At that time, her Epworth Sleepiness Scale score was 16.  She was found to have moderate overall sleep apnea with an AHI of 20.3/h with a significant positional component with supine sleep AHI at 35.7/h.  She subsequently underwent a CPAP titration study on September 09, 2021 where CPAP was initiated and titrated to 17 cm but due to continued events was transition to BiPAP up to 22 over 18 cm of water.  Was initially recommended that she have a trial of BiPAP auto therapy with an EPAP min of 15, pressure support of 4, with maximum IPAP of 25 with humidification.  Due to supply chain issues, she did not receive her BiPAP unit until February 04, 2022.  [facility] as her DME company.  She has been using an AirFit F20 medium size mask.  She has had significant difficulty with the mask particularly causing irritation to the bridge of her nose.  She admits to getting dry with therapy and actually had self adjusted humidification to maximum which was in excess.  A download was obtained from February 25, 2022 through Mar 26, 2022.  She is meeting compliance standards with usage every day averaging 8 hours  per night.  Her 95th percentile pressure is 19.4/15.4 with a maximum average pressure at 20.0/16.0.  AHI is 0.8/h.  She does feel improved since initiating CPAP therapy.  A new Epworth Sleepiness Scale score was calculated in the office today and although improved it was still consistent with mild residual daytime sleepiness with a score of 11.  She denies any restless legs, bruxism, sleep paralysis, or cataplectic events.  She presents for her initial evaluation. ? ? ?Past Medical History:  ?Diagnosis Date  ? Bradycardia   ? Hypertension   ? Paroxysmal atrial fibrillation (HCC)   ? Stroke (cerebrum) ([place])   ? ? ?Past Surgical History:  ?Procedure Laterality Date  ? ATRIAL FIBRILLATION ABLATION N/A 09/17/2021  ? Procedure: ATRIAL FIBRILLATION ABLATION;  Surgeon: Vickie Epley, MD;  Location: [facility];  Service: Cardiovascular;  Laterality: N/A;  ? CATARACT EXTRACTION, BILATERAL    ? LOOP RECORDER INSERTION N/A 05/17/2018  ? Procedure: LOOP RECORDER INSERTION;  Surgeon: Deboraha Sprang, MD;  Location: [facility];  Service: Cardiovascular;  Laterality: N/A;  ? LOOP RECORDER INSERTION  05/17/2018  ? Procedure: LOOP RECORDER INSERTION;  Surgeon: Fay Records, MD;  Location: [facility];  Service: Cardiovascular;;  ? TEE WITHOUT CARDIOVERSION N/A 05/17/2018  ? Procedure: TRANSESOPHAGEAL ECHOCARDIOGRAM (TEE);  Surgeon: Fay Records, MD;  Location: [facility];  Service: Cardiovascular;  Laterality: N/A;  ? TRIGGER FINGER RELEASE    ?  TUMOR REMOVAL    ? ? ?Current Medications: ?Outpatient Medications Prior to Visit  ?Medication Sig Dispense Refill  ? acetaminophen (TYLENOL) 325 MG tablet Take 325 mg by mouth every 6 (six) hours as needed for moderate pain or fever.    ? apixaban (ELIQUIS) 5 MG TABS tablet Take 1 tablet (5 mg total) by mouth 2 (two) times daily. 180 tablet 1  ? atorvastatin (LIPITOR) 40 MG tablet Take 1 tablet (40 mg total) by mouth daily at 6 PM. 30 tablet 3  ? cholecalciferol  (VITAMIN D3) 25 MCG (1000 UNIT) tablet Take 1,000 Units by mouth daily.    ? clobetasol ointment (TEMOVATE) 9.70 % Apply 1 application topically daily as needed (skin irritation).    ? co-enzyme Q-10 50 MG capsule Take 100 mg by mouth daily.    ? Magnesium 100 MG TABS Take by mouth.    ? pantoprazole (PROTONIX) 40 MG tablet TAKE 1 TABLET BY MOUTH DAILY. TAKE 30-60 MIN BEFORE FIRST MEAL OF THE DAY 90 tablet 1  ? Triamcinolone Acetonide (NASACORT AQ NA) Place 1 spray into both nostrils daily as needed (allergies).     ? Turmeric 500 MG CAPS Take 1,000 mg by mouth daily.    ? Zinc 20 MG CAPS Take by mouth.    ? DHA-EPA-Vitamin E (OMEGA-3 COMPLEX PO) Take 1 capsule by mouth at bedtime. (Patient not taking: Reported on 03/26/2022)    ? famotidine (PEPCID) 20 MG tablet One after supper (Patient not taking: Reported on 03/26/2022) 30 tablet 11  ? Multiple Vitamins-Minerals (IMMUNE SUPPORT PO) Take 7 tablets by mouth at bedtime. Id Life ? ?PM Package ?Omega 3 complex ?Vit d3 plus cofactors ?Advance pm ?Axitant plus (Patient not taking: Reported on 03/26/2022)    ? OVER THE COUNTER MEDICATION Take 1 Package by mouth daily. Multivitamin ?D life ?Vitamin pack (morning Pack) ? ?Spirulina/chlorella ?Advance AM (Patient not taking: Reported on 03/26/2022)    ? ?No facility-administered medications prior to visit.  ?  ? ?Allergies:   Patient has no known allergies.  ? ?Social History  ? ?Socioeconomic History  ? Marital status: Single  ?  Spouse name: Not on file  ? Number of children: Not on file  ? Years of education: Not on file  ? Highest education level: Not on file  ?Occupational History  ? Not on file  ?Tobacco Use  ? Smoking status: Former  ?  Packs/day: 2.00  ?  Years: 30.00  ?  Pack years: 60.00  ?  Types: Cigarettes  ?  Quit date: 11/24/1990  ?  Years since quitting: 31.3  ? Smokeless tobacco: Never  ?Vaping Use  ? Vaping Use: Never used  ?Substance and Sexual Activity  ? Alcohol use: No  ? Drug use: Never  ? Sexual activity: Not  on file  ?Other Topics Concern  ? Not on file  ?Social History Narrative  ? Not on file  ? ?Social Determinants of Health  ? ?Financial Resource Strain: Not on file  ?Food Insecurity: Not on file  ?Transportation Needs: Not on file  ?Physical Activity: Not on file  ?Stress: Not on file  ?Social Connections: Not on file  ?  ?Socially, she was born in [place].  She was raised in [place] and follows the [employer].  There is remote tobacco history. ? ?Family History:  The patient's family history includes Breast cancer in her mother; Diabetes in her brother and mother; Heart disease in her father; Hypertension in  her brother and sister; Thyroid disease in her mother and sister.  ? ?ROS ?General: Negative; No fevers, chills, or night sweats;  ?HEENT: Negative; No changes in vision or hearing, sinus congestion, difficulty swallowing ?Pulmonary: Negative; No cough, wheezing, shortness of breath, hemoptysis ?Cardiovascular: History of A-fib, status post ablation, ?GI: Negative; No nausea, vomiting, diarrhea, or abdominal pain ?GU: Negative; No dysuria, hematuria, or difficulty voiding ?Musculoskeletal: Negative; no myalgias, joint pain, or weakness ?Hematologic/Oncology: Negative; no easy bruising, bleeding ?Endocrine: Negative; no heat/cold intolerance; no diabetes ?Neuro: Remote TIA, ?Skin: Negative; No rashes or skin lesions ?Psychiatric: Negative; No behavioral problems, depression ?Sleep: OSA, see HPI ?Other comprehensive 14 point system review is negative. ? ? ?PHYSICAL EXAM:   ?VS:  BP 140/70 (BP Location: Left Arm)   Pulse (!) 54   Ht 5' 5.5" (1.664 m)   Wt 168 lb 9.6 oz (76.5 kg)   SpO2 97%   BMI 27.63 kg/m?    ? ?Repeat blood pressure by me was 130/74 ? ?Wt Readings from Last 3 Encounters:  ?03/26/22 168 lb 9.6 oz (76.5 kg)  ?01/22/22 170 lb (77.1 kg)  ?01/02/22 169 lb (76.7 kg)  ?  ?General: Alert, oriented, no distress.  ?Skin: normal turgor, no rashes, warm and dry ?HEENT: Normocephalic,  atraumatic. Pupils equal round and reactive to light; sclera anicteric; extraocular muscles intact;  ?Nose without nasal septal hypertrophy ?Mouth/Parynx benign; Mallinpatti scale 3 ?Neck: No JVD, no carotid

## 2022-03-26 NOTE — Patient Instructions (Addendum)
Medication Instructions:  ? ?No changes ? ?*If you need a refill on your cardiac medications before your next appointment, please call your pharmacy* ? ? ?Lab Work: ?Not needed ? ? ?Testing/Procedures: ?Not needed ? ? ?Follow-Up: ?At [facility], you and your health needs are our priority.  As part of our continuing mission to provide you with exceptional heart care, we have created designated Provider Care Teams.  These Care Teams include your primary Cardiologist (physician) and Advanced Practice Providers (APPs -  Physician Assistants and Nurse Practitioners) who all work together to provide you with the care you need, when you need it. ? ?  ? ?Your next appointment:   ?12 month(s) ? ?The format for your next appointment:   ?In Person ? ?Provider:   ?Dr Shelva Majestic ? ? ?Other Instructions  ? pressure changes were done to your C-PAP --  new mask F30i-  ramp set  6 ?

## 2022-03-27 DIAGNOSIS — Z23 Encounter for immunization: Secondary | ICD-10-CM | POA: Diagnosis not present

## 2022-03-27 DIAGNOSIS — S81811A Laceration without foreign body, right lower leg, initial encounter: Secondary | ICD-10-CM | POA: Diagnosis not present

## 2022-03-27 DIAGNOSIS — W228XXA Striking against or struck by other objects, initial encounter: Secondary | ICD-10-CM | POA: Diagnosis not present

## 2022-03-31 ENCOUNTER — Encounter: Payer: Self-pay | Admitting: Cardiovascular Disease

## 2022-04-09 ENCOUNTER — Encounter (HOSPITAL_COMMUNITY): Payer: Self-pay | Admitting: Gastroenterology

## 2022-04-09 DIAGNOSIS — R030 Elevated blood-pressure reading, without diagnosis of hypertension: Secondary | ICD-10-CM | POA: Diagnosis not present

## 2022-04-09 DIAGNOSIS — M8580 Other specified disorders of bone density and structure, unspecified site: Secondary | ICD-10-CM | POA: Diagnosis not present

## 2022-04-09 NOTE — Progress Notes (Signed)
Attempted to obtain medical history via telephone, unable to reach at this time. HIPAA compliant voicemail message left requesting return call to pre surgical testing department. 

## 2022-04-10 DIAGNOSIS — M25562 Pain in left knee: Secondary | ICD-10-CM | POA: Diagnosis not present

## 2022-04-10 DIAGNOSIS — M1712 Unilateral primary osteoarthritis, left knee: Secondary | ICD-10-CM | POA: Diagnosis not present

## 2022-04-14 ENCOUNTER — Telehealth: Payer: Self-pay | Admitting: Gastroenterology

## 2022-04-14 NOTE — Telephone Encounter (Signed)
The pt wanted to confirm the location of her procedure.  I did make her aware that her appt is at [facility].  She was also given the address and arrival time.  The pt has no further questions.

## 2022-04-14 NOTE — Telephone Encounter (Signed)
Inbound call from patient stating that she has questions about her upcoming procedure on 5/24 with Dr. Tarri Glenn at [facility]. Please advise.

## 2022-04-16 ENCOUNTER — Ambulatory Visit (HOSPITAL_COMMUNITY)
Admission: RE | Admit: 2022-04-16 | Discharge: 2022-04-16 | Disposition: A | Discharge status: Home / Self Care | Payer: Medicare Other | Attending: Gastroenterology | Admitting: Gastroenterology

## 2022-04-16 ENCOUNTER — Encounter (HOSPITAL_COMMUNITY)
Admission: RE | Disposition: A | Discharge status: Home / Self Care | Payer: Self-pay | Source: Home / Self Care | Attending: Gastroenterology

## 2022-04-16 DIAGNOSIS — K5900 Constipation, unspecified: Secondary | ICD-10-CM | POA: Diagnosis not present

## 2022-04-16 DIAGNOSIS — K6289 Other specified diseases of anus and rectum: Secondary | ICD-10-CM | POA: Diagnosis not present

## 2022-04-16 DIAGNOSIS — K5902 Outlet dysfunction constipation: Secondary | ICD-10-CM | POA: Diagnosis not present

## 2022-04-16 HISTORY — PX: ANAL RECTAL MANOMETRY: SHX6358

## 2022-04-16 SURGERY — MANOMETRY, ANORECTAL
Anesthesia: Monitor Anesthesia Care

## 2022-04-16 NOTE — Progress Notes (Signed)
Anal manometry done per protocol. Pt tolerated well without distress or complication   

## 2022-04-17 ENCOUNTER — Encounter (HOSPITAL_COMMUNITY): Payer: Self-pay | Admitting: Gastroenterology

## 2022-05-01 DIAGNOSIS — K5902 Outlet dysfunction constipation: Secondary | ICD-10-CM

## 2022-05-02 ENCOUNTER — Other Ambulatory Visit: Payer: Self-pay

## 2022-05-02 DIAGNOSIS — M6289 Other specified disorders of muscle: Secondary | ICD-10-CM

## 2022-05-06 ENCOUNTER — Encounter (HOSPITAL_BASED_OUTPATIENT_CLINIC_OR_DEPARTMENT_OTHER): Payer: Self-pay | Admitting: Cardiology

## 2022-05-06 ENCOUNTER — Ambulatory Visit (INDEPENDENT_AMBULATORY_CARE_PROVIDER_SITE_OTHER): Payer: Medicare Other | Admitting: Cardiology

## 2022-05-06 VITALS — BP 122/78 | HR 63 | Ht 65.0 in | Wt 171.0 lb

## 2022-05-06 DIAGNOSIS — Z7901 Long term (current) use of anticoagulants: Secondary | ICD-10-CM

## 2022-05-06 DIAGNOSIS — D6869 Other thrombophilia: Secondary | ICD-10-CM

## 2022-05-06 DIAGNOSIS — Z8673 Personal history of transient ischemic attack (TIA), and cerebral infarction without residual deficits: Secondary | ICD-10-CM | POA: Diagnosis not present

## 2022-05-06 DIAGNOSIS — Z01810 Encounter for preprocedural cardiovascular examination: Secondary | ICD-10-CM

## 2022-05-06 DIAGNOSIS — I480 Paroxysmal atrial fibrillation: Secondary | ICD-10-CM

## 2022-05-06 NOTE — Patient Instructions (Signed)
Medication Instructions:  Your Physician recommend you continue on your current medication as directed.    *If you need a refill on your cardiac medications before your next appointment, please call your pharmacy*   Lab Work: None ordered today   Testing/Procedures: You are cleared for your procedure, Dr. Harrell Gave will send this over to the team!    Follow-Up: At [facility], you and your health needs are our priority.  As part of our continuing mission to provide you with exceptional heart care, we have created designated Provider Care Teams.  These Care Teams include your primary Cardiologist (physician) and Advanced Practice Providers (APPs -  Physician Assistants and Nurse Practitioners) who all work together to provide you with the care you need, when you need it.  We recommend signing up for the patient portal called "MyChart".  Sign up information is provided on this After Visit Summary.  MyChart is used to connect with patients for Virtual Visits (Telemedicine).  Patients are able to view lab/test results, encounter notes, upcoming appointments, etc.  Non-urgent messages can be sent to your provider as well.   To learn more about what you can do with MyChart, go to [email or web address].    Your next appointment:   1 year(s)  The format for your next appointment:   In Person  Provider:   Buford Dresser, MD{  Other Instructions Heart Healthy Diet Recommendations: A low-salt diet is recommended. Meats should be grilled, baked, or boiled. Avoid fried foods. Focus on lean protein sources like fish or chicken with vegetables and fruits. The American Heart Association is a Microbiologist!  American Heart Association Diet and Lifeystyle Recommendations   Exercise recommendations: The American Heart Association recommends 150 minutes of moderate intensity exercise weekly. Try 30 minutes of moderate intensity exercise 4-5 times per week. This could include walking,  jogging, or swimming.   Important Information About Sugar

## 2022-05-06 NOTE — Progress Notes (Signed)
Cardiology Office Note:    Date:  05/06/2022   ID:  Kristin Lowe, DOB 1941/05/22, MRN [id number]  PCP:  Kristin Infante, MD  Cardiologist:  Kristin Dresser, MD  EP: Kristin Lowe  Referring MD: Kristin Infante, MD   CC: follow-up  History of Present Illness:    Kristin Lowe is a 81 y.o. female with a hx of cryptogenic stroke, hypertension, sinus bradycardia, paroxysmal atrial fibrillation on anticoagulation, venous insufficiency who is seen for follow-up. I initially met her as a new consult at the request of Kristin Infante, MD for the evaluation and management of PVCs. She follows with Kristin Lowe and has an implantable loop recorder for evaluation of cryptogenic stroke.  Today: Here for preoperative cardiovascular evaluation. No date yet, but planning to have total knee replacement by Dr. Theda Lowe hopefully in late July.   History of anesthesia complications: none Current symptoms: none  She is on apixaban for paroxysmal atrial fibrillation. She has not felt any afib since her ablation. She had loop recorder removed as it was bothering her.    Functional capacity:  For activity, she can do 4000 steps with her knee discomfort before she has to stop and rest. Other than the discomfort, she can walk up to 10,000-11,000 steps without being strenuous. She has stairs in her house which she walks everyday, and adds that she does not go up the stairs as quickly as she used to before the onset of the knee pain. She plans to start riding her bike again before the surgery.    She experiences edema in her ankles occasionally, but this is well managed with compression stockings.   Denies chest pain, shortness of breath at rest or with normal exertion. No PND, orthopnea, LE edema or unexpected weight gain. No syncope or palpitations.    Past Medical History:  Diagnosis Date   Bradycardia    Hypertension    Paroxysmal atrial fibrillation (HCC)    Stroke (cerebrum) [facility])     Past Surgical  History:  Procedure Laterality Date   ANAL RECTAL MANOMETRY N/A 04/16/2022   Procedure: ANO RECTAL MANOMETRY;  Surgeon: Thornton Park, MD;  Location: [facility];  Service: Gastroenterology;  Laterality: N/A;   ATRIAL FIBRILLATION ABLATION N/A 09/17/2021   Procedure: ATRIAL FIBRILLATION ABLATION;  Surgeon: Vickie Epley, MD;  Location: [place] [facility];  Service: Cardiovascular;  Laterality: N/A;   CATARACT EXTRACTION, BILATERAL     LOOP RECORDER INSERTION N/A 05/17/2018   Procedure: LOOP RECORDER INSERTION;  Surgeon: Deboraha Sprang, MD;  Location: [place] [facility];  Service: Cardiovascular;  Laterality: N/A;   LOOP RECORDER INSERTION  05/17/2018   Procedure: LOOP RECORDER INSERTION;  Surgeon: Fay Records, MD;  Location: [place];  Service: Cardiovascular;;   TEE WITHOUT CARDIOVERSION N/A 05/17/2018   Procedure: TRANSESOPHAGEAL ECHOCARDIOGRAM (TEE);  Surgeon: Fay Records, MD;  Location: [facility] ENDOSCOPY;  Service: Cardiovascular;  Laterality: N/A;   TRIGGER FINGER RELEASE     TUMOR REMOVAL      Current Medications: Current Outpatient Medications on File Prior to Visit  Medication Sig   acetaminophen (TYLENOL) 325 MG tablet Take 325 mg by mouth every 6 (six) hours as needed for moderate pain or fever.   apixaban (ELIQUIS) 5 MG TABS tablet Take 1 tablet (5 mg total) by mouth 2 (two) times daily.   atorvastatin (LIPITOR) 40 MG tablet Take 1 tablet (40 mg total) by mouth daily at 6 PM.   cholecalciferol (VITAMIN D3) 25  MCG (1000 UNIT) tablet Take 1,000 Units by mouth daily.   clobetasol ointment (TEMOVATE) 2.48 % Apply 1 application topically daily as needed (skin irritation).   co-enzyme Q-10 50 MG capsule Take 100 mg by mouth daily.   Magnesium 100 MG TABS Take by mouth.   Triamcinolone Acetonide (NASACORT AQ NA) Place 1 spray into both nostrils daily as needed (allergies).    Turmeric 500 MG CAPS Take 1,000 mg by mouth daily.   Zinc 20 MG CAPS Take by mouth.   No current  facility-administered medications on file prior to visit.     Allergies:   Patient has no known allergies.   Social History   Tobacco Use   Smoking status: Former    Packs/day: 2.00    Years: 30.00    Total pack years: 60.00    Types: Cigarettes    Quit date: 11/24/1990    Years since quitting: 31.4   Smokeless tobacco: Never  Vaping Use   Vaping Use: Never used  Substance Use Topics   Alcohol use: No   Drug use: Never    Family History: family history includes Breast cancer in her mother; Diabetes in her brother and mother; Heart disease in her father; Hypertension in her brother and sister; Thyroid disease in her mother and sister.  ROS:   Please see the history of present illness. All other systems are reviewed and negative.   EKGs/Labs/Other Studies Reviewed:    The following studies were reviewed today: CT cardiac 09/16/21 FINDINGS: Image quality: Good.   Noise artifact is: Limited.   Left Atrium: Mildly dilated.  No ASD.   There is normal pulmonary vein drainage into the left atrium (2 on the right and 2 on the left) with ostial measurements as follows:   RUPV: 22.6 x 18.9 mm, area 3.21 cm2   RLPV: 23.6 x 20.8 mm, area 3.84 cm2   LUPV: 19.2 x 15.5 mm, area 2.27 cm2   LLPV: 18.9 x 14.3 mm, area 2.05 cm2   No anomalous pulmonary venous drainage. No pulmonary vein stenosis.   The esophagus runs adjacent to the left sided pulmonary veins.   Left Atrial Appendage:   Morphology: The left atrial appendage is large chicken wing type with two lobes. The distal lobe has a superior/posteromedial angulation. Thrombus: There is no thrombus in the left atrial appendage on contrast or delayed imaging.   The following measurements were made regarding left atrial appendage closure:   Phase assessed: 25%   Landing Zone measurement: 20.5 x 19.0 mm   LAA Length (maximum): 16.5 mm   Optimal interatrial septum puncture site: inferior/posterior   Optimal  deployment angle:RAO 54 CAU 34   Catheter: A double curve catheter is recommended.   Watchman FLX Device: A 24 mm device is recommended with 15% compression.   Other comments: Trabeculation is noted anterior and inferior to the maximum appendage length measurement - recommend procedural caution to avoid.   Coronary Arteries: Normal coronary origin. Right dominance. The study was performed without use of NTG and insufficient for plaque evaluation, however coronary arteries appear . Coronary artery calcium score is 10, which is 24th percentile for age and sex matched peers.   Right Atrium: Right atrial size is mildly dilated.   Right Ventricle: The right ventricular cavity is within normal limits.   Left Ventricle: The ventricular cavity size is within normal limits. There are no stigmata of prior infarction. There is no abnormal filling defect.   Pulmonary Artery: Normal caliber  without proximal filling defect.   Aortic valve: Aortic valve is trileaflet without significant calcification.   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 Mitral valve appears thickened.   Aorta: Normal caliber with no significant disease. 33 mm at mid ascending aorta.   Pericardium: Normal thickness with no significant effusion or calcium present.   Extra-cardiac findings: See attached radiology report for non-cardiac structures.   IMPRESSION: 1. There is normal pulmonary vein drainage into the left atrium. No pulmonary vein stenosis.   2. Normal left atrial appendage, no left atrial appendage thrombus. No intracardiac mass or thrombus.   3. The esophagus runs adjacent to the left sided pulmonary veins.   4. The left atrial appendage is a large chicken wing morphology without thrombus.   5. A 1m Watchman FLX device is recommended based on the above landing zone measurements (20.5 mm maximum diameter; 15 % compression, max length 16.5 mm).    6. An inferior posterior IAS puncture site is recommended.   7. Optimal deployment angle: RAO 54 CAU 34   8. Normal coronary origin. Right dominance. Coronary artery calcium score is 10, which is 24th percentile for age and sex matched peers.   9. 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 Mitral valve is thickened.  Echo 05/16/18 - Left ventricle: The cavity size was normal. Systolic function was    normal. The estimated ejection fraction was in the range of 55%    to 60%. Wall motion was normal; there were no regional wall    motion abnormalities.  - Mitral valve: There was mild regurgitation.   Impressions:   - No cardiac source of emboli was indentified.  TEE 05/17/18 -no significant abnormalities, mild plaque in thoracic aorta  EKG:  EKG is personally reviewed.    05/29/2021: sinus bradycardia at 50 bpm, iRBBB 08/29/2020: sinus bradycardia at 52 bpm, iRBBB  Recent Labs: 07/12/2021: ALT 19; TSH 0.564 09/03/2021: BUN 18; Creatinine, Ser 0.75; Potassium 4.5; Sodium 139 09/11/2021: Hemoglobin 12.8; Platelets 264.0  Recent Lipid Panel    Component Value Date/Time   CHOL 182 05/15/2018 2119   TRIG 115 05/15/2018 2119   HDL 53 05/15/2018 2119   CHOLHDL 3.4 05/15/2018 2119   VLDL 23 05/15/2018 2119   LDLCALC 106 (H) 05/15/2018 2119    Physical Exam:    VS:  BP 122/78   Pulse 63   Ht '5\' 5"'  (1.651 m)   Wt 171 lb (77.6 kg)   BMI 28.46 kg/m     Wt Readings from Last 3 Encounters:  05/06/22 171 lb (77.6 kg)  03/26/22 168 lb 9.6 oz (76.5 kg)  01/22/22 170 lb (77.1 kg)    GEN: Well nourished, well developed in no acute distress HEENT: Normal, moist mucous membranes NECK: No JVD CARDIAC: regular rhythm, normal S1 and S2, no rubs or gallops. No murmur. VASCULAR: Radial and DP pulses 2+ bilaterally. No carotid bruits RESPIRATORY:  Clear to auscultation without rales, wheezing or rhonchi  ABDOMEN: Soft, non-tender,  non-distended MUSCULOSKELETAL:  Ambulates independently. Mild edema in L knee SKIN: Warm and dry, no edema NEUROLOGIC:  Alert and oriented x 3. No focal neuro deficits noted. PSYCHIATRIC:  Normal affect    ASSESSMENT:    1. Preop cardiovascular exam   2. Paroxysmal atrial fibrillation (HCC)   3. History of CVA (cerebrovascular accident)   4. [place]   5. Long term current use of anticoagulant    PLAN:  Preoperative cardiovascular evaluation According to the Revised Cardiac Risk Index (RCRI), her Perioperative Risk of Major Cardiac Event is (%): 0.9  The patient is not currently having active cardiac symptoms, and they can achieve >4 METs of activity.  According to ACC/AHA Guidelines, no further testing is needed.  Proceed with surgery at acceptable risk.  Our service is available as needed in the peri-operative period.     The patient can hold apixaban preoperatively. She should restart apixaban as soon as deemed ready by surgery post operatively.  History of CVA Rare paroxysmal atrial fibrillation, now s/p ablation Reported prior history of PVCs -CHA2DS2/VAS Stroke Risk Points=6  -continue atorvastatin -no aspirin as she is on apixaban  Intermittent LE edema: none today on exam, but pattern fits with chronic venous insufficiency -counseled on compression stockings, elevation, doing well with this  Cardiac risk counseling and prevention recommendations: -recommend heart healthy/Mediterranean diet, with whole grains, fruits, vegetable, fish, lean meats, nuts, and olive oil. Limit salt. -recommend moderate walking, 3-5 times/week for 30-50 minutes each session. Aim for at least 150 minutes.week. Goal should be pace of 3 miles/hours, or walking 1.5 miles in 30 minutes -recommend avoidance of tobacco products. Avoid excess alcohol.  Plan for follow up: 1 year or sooner PRN  Kristin Dresser, MD, PhD [place]  [facility]     Medication Adjustments/Labs and Tests Ordered: Current medicines are reviewed at length with the patient today.  Concerns regarding medicines are outlined above.  No orders of the defined types were placed in this encounter.  No orders of the defined types were placed in this encounter.  Patient Instructions  Medication Instructions:  Your Physician recommend you continue on your current medication as directed.    *If you need a refill on your cardiac medications before your next appointment, please call your pharmacy*   Lab Work: None ordered today   Testing/Procedures: You are cleared for your procedure, Dr. Harrell Gave will send this over to the team!    Follow-Up: At [facility], you and your health needs are our priority.  As part of our continuing mission to provide you with exceptional heart care, we have created designated Provider Care Teams.  These Care Teams include your primary Cardiologist (physician) and Advanced Practice Providers (APPs -  Physician Assistants and Nurse Practitioners) who all work together to provide you with the care you need, when you need it.  We recommend signing up for the patient portal called "MyChart".  Sign up information is provided on this After Visit Summary.  MyChart is used to connect with patients for Virtual Visits (Telemedicine).  Patients are able to view lab/test results, encounter notes, upcoming appointments, etc.  Non-urgent messages can be sent to your provider as well.   To learn more about what you can do with MyChart, go to [email or web address].    Your next appointment:   1 year(s)  The format for your next appointment:   In Person  Provider:   Buford Dresser, MD{  Other Instructions Heart Healthy Diet Recommendations: A low-salt diet is recommended. Meats should be grilled, baked, or boiled. Avoid fried foods. Focus on lean protein sources like fish or chicken with vegetables and fruits. The American  Heart Association is a Microbiologist!  American Heart Association Diet and Lifeystyle Recommendations   Exercise recommendations: The American Heart Association recommends 150 minutes of moderate intensity exercise weekly. Try 30 minutes of moderate intensity exercise 4-5 times per week. This could include walking, jogging, or swimming.  Important Information About Sugar         Signed, Kristin Dresser, MD PhD 05/06/2022     [place]

## 2022-05-07 DIAGNOSIS — R053 Chronic cough: Secondary | ICD-10-CM | POA: Diagnosis not present

## 2022-05-08 DIAGNOSIS — D6869 Other thrombophilia: Secondary | ICD-10-CM | POA: Diagnosis not present

## 2022-05-08 DIAGNOSIS — I480 Paroxysmal atrial fibrillation: Secondary | ICD-10-CM | POA: Diagnosis not present

## 2022-05-08 DIAGNOSIS — G4733 Obstructive sleep apnea (adult) (pediatric): Secondary | ICD-10-CM | POA: Diagnosis not present

## 2022-05-08 DIAGNOSIS — I6389 Other cerebral infarction: Secondary | ICD-10-CM | POA: Diagnosis not present

## 2022-05-08 DIAGNOSIS — Z960 Presence of urogenital implants: Secondary | ICD-10-CM | POA: Diagnosis not present

## 2022-05-08 DIAGNOSIS — Z7901 Long term (current) use of anticoagulants: Secondary | ICD-10-CM | POA: Diagnosis not present

## 2022-05-08 DIAGNOSIS — Z01818 Encounter for other preprocedural examination: Secondary | ICD-10-CM | POA: Diagnosis not present

## 2022-05-08 DIAGNOSIS — R82998 Other abnormal findings in urine: Secondary | ICD-10-CM | POA: Diagnosis not present

## 2022-05-08 DIAGNOSIS — R7309 Other abnormal glucose: Secondary | ICD-10-CM | POA: Diagnosis not present

## 2022-05-08 DIAGNOSIS — M1712 Unilateral primary osteoarthritis, left knee: Secondary | ICD-10-CM | POA: Diagnosis not present

## 2022-05-08 DIAGNOSIS — Z131 Encounter for screening for diabetes mellitus: Secondary | ICD-10-CM | POA: Diagnosis not present

## 2022-05-14 ENCOUNTER — Ambulatory Visit: Payer: Medicare Other | Attending: Gastroenterology | Admitting: Physical Therapy

## 2022-05-14 ENCOUNTER — Encounter: Payer: Self-pay | Admitting: Physical Therapy

## 2022-05-14 DIAGNOSIS — R279 Unspecified lack of coordination: Secondary | ICD-10-CM | POA: Insufficient documentation

## 2022-05-14 DIAGNOSIS — M6281 Muscle weakness (generalized): Secondary | ICD-10-CM | POA: Insufficient documentation

## 2022-05-14 DIAGNOSIS — R293 Abnormal posture: Secondary | ICD-10-CM | POA: Insufficient documentation

## 2022-05-14 DIAGNOSIS — M6289 Other specified disorders of muscle: Secondary | ICD-10-CM | POA: Insufficient documentation

## 2022-05-14 NOTE — Patient Instructions (Addendum)

## 2022-05-14 NOTE — Therapy (Signed)
OUTPATIENT PHYSICAL THERAPY FEMALE PELVIC EVALUATION   Patient Name: BETZABETH DERRINGER MRN: [id number] DOB:1941/07/27, 81 y.o., female Today's Date: 05/14/2022   PT End of Session - 05/14/22 1024     Visit Number 1    Date for PT Re-Evaluation 08/14/22    Authorization Type medicare A and B    PT Start Time 1015    PT Stop Time 1055    PT Time Calculation (min) 40 min    Activity Tolerance Patient tolerated treatment well    Behavior During Therapy WFL for tasks assessed/performed             Past Medical History:  Diagnosis Date   Bradycardia    Hypertension    Paroxysmal atrial fibrillation ([place])    Stroke (cerebrum) ([place])    Past Surgical History:  Procedure Laterality Date   ANAL RECTAL MANOMETRY N/A 04/16/2022   Procedure: ANO RECTAL MANOMETRY;  Surgeon: Thornton Park, MD;  [facility]: [facility];  Service: Gastroenterology;  Laterality: N/A;   ATRIAL FIBRILLATION ABLATION N/A 09/17/2021   Procedure: ATRIAL FIBRILLATION ABLATION;  Surgeon: Vickie Epley, MD;  Location: [place] [facility];  Service: Cardiovascular;  Laterality: N/A;   CATARACT EXTRACTION, BILATERAL     LOOP RECORDER INSERTION N/A 05/17/2018   Procedure: LOOP RECORDER INSERTION;  Surgeon: Deboraha Sprang, MD;  Location: [place] [facility];  Service: Cardiovascular;  Laterality: N/A;   LOOP RECORDER INSERTION  05/17/2018   Procedure: LOOP RECORDER INSERTION;  Surgeon: Fay Records, MD;  Location: [place];  Service: Cardiovascular;;   TEE WITHOUT CARDIOVERSION N/A 05/17/2018   Procedure: TRANSESOPHAGEAL ECHOCARDIOGRAM (TEE);  Surgeon: Fay Records, MD;  Location: [place];  Service: Cardiovascular;  Laterality: N/A;   TRIGGER FINGER RELEASE     TUMOR REMOVAL     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    Elevated coronary artery calcium score 10/25/2021   Chronic cough 09/11/2021   OSA (obstructive sleep apnea) 09/09/2021    History of CVA (cerebrovascular accident) 08/29/2020   Paroxysmal atrial fibrillation ([place]) 08/29/2020   TIA (transient ischemic attack) 05/15/2018   Sinus bradycardia 05/15/2018   Essential hypertension 05/15/2018   Hyperglycemia 05/15/2018   Multiple thyroid nodules 09/17/2017   Other fatigue 09/17/2017    PCP: none per chart  REFERRING PROVIDER: Thornton Park, MD  REFERRING DIAG: M62.89 (ICD-10-CM) - Pelvic floor dysfunction  THERAPY DIAG:  Muscle weakness (generalized)  Abnormal posture  Unspecified lack of coordination  Rationale for Evaluation and Treatment Rehabilitation  ONSET DATE: 2 years  SUBJECTIVE:  SUBJECTIVE STATEMENT: Pt reports she stayed with family for 3 weeks 2 years ago in [place] and has not been able to return to regular bowel movements. Now, going very irregular in timing and usually type 1.   Fluid intake: Yes: water - 3x16oz but tries to have 8 glasses but doesn't notice a difference in stools. Coffee - 1-2 cup of decaf in the morning     PAIN:  Are you having pain? No  PRECAUTIONS: None  WEIGHT BEARING RESTRICTIONS No  FALLS:  Has patient fallen in last 6 months? No  LIVING ENVIRONMENT: Lives with: lives alone Lives in: House/apartment   OCCUPATION: retired   PLOF: Independent  PATIENT GOALS to be more regular with bowel movements  PERTINENT HISTORY:  Bradycardia, Hypertension, Paroxysmal atrial fibrillation, Stroke (cerebrum) Sexual abuse: No  BOWEL MOVEMENT Pain with bowel movement: No Type of bowel movement:Type (Bristol Stool Scale) 1, Frequency daily but not regular, and Strain Yes Fully empty rectum: No Leakage: No Pads: No Fiber supplement: No  URINATION Pain with urination: No Fully empty bladder: Yes:   Stream:  Strong Urgency: No Frequency: not more than every 2 hours Leakage: Sneezing, Laughing, and waiting too long to urinate Pads: Yes: only small liner if she'll be away from the house for several hours.   INTERCOURSE Pain with intercourse:  Not active  PREGNANCY Vaginal deliveries 1 Tearing No Miscarriage: 1  PROLAPSE None    OBJECTIVE:   DIAGNOSTIC FINDINGS:  COGNITION:  Overall cognitive status: Within functional limits for tasks assessed     SENSATION:  Light touch: Appears intact  Proprioception: Appears intact  MUSCLE LENGTH: Bil hamstrings and adductors limited by 25%               POSTURE: rounded shoulders, forward head, and posterior pelvic tilt    LUMBARAROM/PROM  A/PROM A/PROM  eval  Flexion Limited by 25%  Extension WFL  Right lateral flexion Limited by 25%  Left lateral flexion Limited by 25%  Right rotation Limited by 25%  Left rotation Limited by 25%   (Blank rows = not tested)  LOWER EXTREMITY ROM:  WFL bil though R knee limited with pain and pt pending replacement  LOWER EXTREMITY MMT:    Rt hip flexion 3+/5 with pain from knee per pt, abduction, adduction and extension 3+/5 though without pain   Lt hip grossly 4/5   PALPATION:   General  no TTP but did have noted fascial restrictions in all abdominal quadrants                 External Perineal Exam pt deferred                              Internal Pelvic Floor pt deferred   Patient confirms identification and approves PT to assess internal pelvic floor and treatment No  PELVIC MMT:   MMT eval  Vaginal   Internal Anal Sphincter   External Anal Sphincter   Puborectalis   Diastasis Recti   (Blank rows = not tested)        TONE: Deferred   PROLAPSE: Deferred   TODAY'S TREATMENT  EVAL Examination completed, findings reviewed, pt educated on POC, voiding mechanics, breathing mechanics, abdominal massage, fiber types. Pt motivated to participate in PT and agreeable to attempt  recommendations.     PATIENT EDUCATION:  Education details: voiding mechanics, breathing mechanics, abdominal massage, fiber types Person educated: Patient Education method:  Explanation, Demonstration, Tactile cues, Verbal cues, and Handouts Education comprehension: verbalized understanding and returned demonstration   HOME EXERCISE PROGRAM: voiding mechanics, breathing mechanics, abdominal massage, fiber types  ASSESSMENT:  CLINICAL IMPRESSION: Patient is a 81 y.o. female  who was seen today for physical therapy evaluation and treatment for constipation and urinary incontinence. Pt found to have decreased flexibility in spine and bil hips, decreased core and bil hip strength, is limited in mobility with pain at Rt knee and pending to have replacement per pt. Pt also had fascial restrictions throughout abdomen without pain. Pt deferred internal assessment at this time, requesting to attempt HEP first to see if it helps. Pt given and educated on voiding mechanics, breathing mechanics, abdominal massage, and fiber types to help with constipation initially. Pt reports this her biggest concern over the leakage but is interested in having this treated if possible too. Pt is having pending knee replacement surgery next month and isn't sure about scheduling but wants to try for at least one follow up before surgery. Pt would benefit from additional PT to further address deficits.     OBJECTIVE IMPAIRMENTS decreased coordination, decreased endurance, decreased mobility, decreased strength, increased fascial restrictions, impaired flexibility, improper body mechanics, postural dysfunction, and pain.   ACTIVITY LIMITATIONS continence  PARTICIPATION LIMITATIONS: community activity  PERSONAL FACTORS Age and Time since onset of injury/illness/exacerbation are also affecting patient's functional outcome.   REHAB POTENTIAL: Good  CLINICAL DECISION MAKING: Stable/uncomplicated  EVALUATION COMPLEXITY:  Low   GOALS: Goals reviewed with patient? Yes  SHORT TERM GOALS: Target date: 06/11/2022  Pt to be I with HEP.  Baseline: Goal status: INITIAL  2.  Pt will report her BMs are complete due to improved bowel habits and evacuation techniques.  Baseline:  Goal status: INITIAL  3.  Pt to demonstrate at least 3/5 pelvic floor strength for improved pelvic stability and decreased strain at pelvic floor/ decrease leakage.  Baseline:  Goal status: INITIAL   LONG TERM GOALS: Target date: 08/14/2022   Pt to be I with advanced HEP.  Baseline:  Goal status: INITIAL  2.  Pt to report 50% less straining with bowel movements to improve voiding mechanics and limit strain at pelvic floor.  Baseline:  Goal status: INITIAL  3.  Pt to report no more than one urinary leakage instance per week due to improved pelvic floor strength and/or coordination.  Baseline:  Goal status: INITIAL  4.  Pt to demonstrate at least 4/5 pelvic floor strength for improved pelvic stability and decreased strain at pelvic floor/ decrease leakage.  Baseline:  Goal status: INITIAL   PLAN: PT FREQUENCY:  1x every other week  PT DURATION:  5 sessions  PLANNED INTERVENTIONS: Therapeutic exercises, Therapeutic activity, Neuromuscular re-education, Patient/Family education, Joint mobilization, Dry Needling, Spinal mobilization, Cryotherapy, Moist heat, scar mobilization, Taping, Biofeedback, and Manual therapy  PLAN FOR NEXT SESSION: internal assessment if needed and pt consents, pelvic floor strengthening HEP if needed  Stacy Gardner, PT, DPT 05/14/2310:18 AM

## 2022-05-26 DIAGNOSIS — D6869 Other thrombophilia: Secondary | ICD-10-CM | POA: Diagnosis not present

## 2022-05-26 DIAGNOSIS — M170 Bilateral primary osteoarthritis of knee: Secondary | ICD-10-CM | POA: Diagnosis not present

## 2022-05-26 DIAGNOSIS — I6389 Other cerebral infarction: Secondary | ICD-10-CM | POA: Diagnosis not present

## 2022-05-26 DIAGNOSIS — M8589 Other specified disorders of bone density and structure, multiple sites: Secondary | ICD-10-CM | POA: Diagnosis not present

## 2022-05-26 DIAGNOSIS — I480 Paroxysmal atrial fibrillation: Secondary | ICD-10-CM | POA: Diagnosis not present

## 2022-05-26 DIAGNOSIS — M1712 Unilateral primary osteoarthritis, left knee: Secondary | ICD-10-CM | POA: Diagnosis not present

## 2022-05-26 DIAGNOSIS — G4733 Obstructive sleep apnea (adult) (pediatric): Secondary | ICD-10-CM | POA: Diagnosis not present

## 2022-05-26 DIAGNOSIS — J45909 Unspecified asthma, uncomplicated: Secondary | ICD-10-CM | POA: Diagnosis not present

## 2022-05-26 DIAGNOSIS — R7301 Impaired fasting glucose: Secondary | ICD-10-CM | POA: Diagnosis not present

## 2022-05-26 DIAGNOSIS — R030 Elevated blood-pressure reading, without diagnosis of hypertension: Secondary | ICD-10-CM | POA: Diagnosis not present

## 2022-05-28 ENCOUNTER — Ambulatory Visit: Payer: Medicare Other | Attending: Gastroenterology | Admitting: Physical Therapy

## 2022-05-28 DIAGNOSIS — R279 Unspecified lack of coordination: Secondary | ICD-10-CM | POA: Insufficient documentation

## 2022-05-28 DIAGNOSIS — M6281 Muscle weakness (generalized): Secondary | ICD-10-CM | POA: Insufficient documentation

## 2022-05-28 NOTE — Therapy (Addendum)
OUTPATIENT PHYSICAL THERAPY FEMALE PELVIC TREATMENT   Patient Name: Kristin Lowe MRN: [id number] DOB:29-Jun-1941, 81 y.o., female Today's Date: 05/28/2022   PT End of Session - 05/28/22 0854     Visit Number 2    Date for PT Re-Evaluation 08/14/22    Authorization Type medicare A and B    PT Start Time 0845    PT Stop Time 0924    PT Time Calculation (min) 39 min    Activity Tolerance Patient tolerated treatment well    Behavior During Therapy WFL for tasks assessed/performed              Past Medical History:  Diagnosis Date   Bradycardia    Hypertension    Paroxysmal atrial fibrillation ([place])    Stroke (cerebrum) ([place])    Past Surgical History:  Procedure Laterality Date   ANAL RECTAL MANOMETRY N/A 04/16/2022   Procedure: ANO RECTAL MANOMETRY;  Surgeon: Thornton Park, MD;  [facility];  Service: Gastroenterology;  Laterality: N/A;   ATRIAL FIBRILLATION ABLATION N/A 09/17/2021   Procedure: ATRIAL FIBRILLATION ABLATION;  Surgeon: Vickie Epley, MD;  Location: [place] [facility];  Service: Cardiovascular;  Laterality: N/A;   CATARACT EXTRACTION, BILATERAL     LOOP RECORDER INSERTION N/A 05/17/2018   Procedure: LOOP RECORDER INSERTION;  Surgeon: Deboraha Sprang, MD;  Location: [place] [facility];  Service: Cardiovascular;  Laterality: N/A;   LOOP RECORDER INSERTION  05/17/2018   Procedure: LOOP RECORDER INSERTION;  Surgeon: Fay Records, MD;  Location: [place];  Service: Cardiovascular;;   TEE WITHOUT CARDIOVERSION N/A 05/17/2018   Procedure: TRANSESOPHAGEAL ECHOCARDIOGRAM (TEE);  Surgeon: Fay Records, MD;  Location: [place];  Service: Cardiovascular;  Laterality: N/A;   TRIGGER FINGER RELEASE     TUMOR REMOVAL     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    Elevated coronary artery calcium score 10/25/2021   Chronic cough 09/11/2021   OSA (obstructive sleep apnea) 09/09/2021    History of CVA (cerebrovascular accident) 08/29/2020   Paroxysmal atrial fibrillation ([place]) 08/29/2020   TIA (transient ischemic attack) 05/15/2018   Sinus bradycardia 05/15/2018   Essential hypertension 05/15/2018   Hyperglycemia 05/15/2018   Multiple thyroid nodules 09/17/2017   Other fatigue 09/17/2017    PCP: none per chart  REFERRING PROVIDER: Thornton Park, MD  REFERRING DIAG: M62.89 (ICD-10-CM) - Pelvic floor dysfunction  THERAPY DIAG:  Muscle weakness (generalized)  Unspecified lack of coordination  Rationale for Evaluation and Treatment Rehabilitation  ONSET DATE: 2 years  SUBJECTIVE:  SUBJECTIVE STATEMENT: Pt reports she has been doing the abdominal massage, didn't see a big difference with this. Pt reports she still have type 1 stools mostly. Pt does still strain sometimes, is using a step stool in bathroom.  Fluid intake: Yes: water - 3x16oz but tries to have 8 glasses but doesn't notice a difference in stools. Coffee - 1-2 cup of decaf in the morning     PAIN:  Are you having pain? No  PRECAUTIONS: None   PLOF: Independent  PATIENT GOALS to be more regular with bowel movements  PERTINENT HISTORY:  Bradycardia, Hypertension, Paroxysmal atrial fibrillation, Stroke (cerebrum) Sexual abuse: No  BOWEL MOVEMENT Pain with bowel movement: No Type of bowel movement:Type (Bristol Stool Scale) 1, Frequency daily but not regular, and Strain Yes Fully empty rectum: No Leakage: No Pads: No Fiber supplement: No  URINATION Pain with urination: No Fully empty bladder: Yes:   Stream: Strong Urgency: No Frequency: not more than every 2 hours Leakage: Sneezing, Laughing, and waiting too long to urinate Pads: Yes: only small liner if she'll be away from the house for several  hours.   INTERCOURSE Pain with intercourse:  Not active  PREGNANCY Vaginal deliveries 1 Tearing No Miscarriage: 1  PROLAPSE None    OBJECTIVE:   DIAGNOSTIC FINDINGS:  COGNITION:  Overall cognitive status: Within functional limits for tasks assessed     SENSATION:  Light touch: Appears intact  Proprioception: Appears intact  MUSCLE LENGTH: Bil hamstrings and adductors limited by 25%               POSTURE: rounded shoulders, forward head, and posterior pelvic tilt    LUMBARAROM/PROM  A/PROM A/PROM  eval  Flexion Limited by 25%  Extension WFL  Right lateral flexion Limited by 25%  Left lateral flexion Limited by 25%  Right rotation Limited by 25%  Left rotation Limited by 25%   (Blank rows = not tested)  LOWER EXTREMITY ROM:  WFL bil though R knee limited with pain and pt pending replacement  LOWER EXTREMITY MMT:    Rt hip flexion 3+/5 with pain from knee per pt, abduction, adduction and extension 3+/5 though without pain   Lt hip grossly 4/5   PALPATION:   General  no TTP but did have noted fascial restrictions in all abdominal quadrants                 External Perineal Exam pt deferred                              Internal Pelvic Floor pt deferred   Patient confirms identification and approves PT to assess internal pelvic floor and treatment No  PELVIC MMT:   MMT eval  Vaginal   Internal Anal Sphincter   External Anal Sphincter   Puborectalis   Diastasis Recti   (Blank rows = not tested)        TONE: Deferred   PROLAPSE: Deferred   TODAY'S TREATMENT   05/28/2022:  Pt given and went over HEP, pt had many questions about pelvic floor strengthening and reasoning for internal assessment, all answered and pt deferred internal.  Pt requested PT do abdominal massage to insure carry over for home, this was completed with x5 CCW and x5 CW abdominal massage. Pt tolerated well and reports this is how she was doing it at home.     EVAL  Examination completed, findings reviewed, pt educated on POC,  voiding mechanics, breathing mechanics, abdominal massage, fiber types. Pt motivated to participate in PT and agreeable to attempt recommendations.     PATIENT EDUCATION:  Education details: [id number] Person educated: Patient Education method: Explanation, Demonstration, Tactile cues, Verbal cues, and Handouts Education comprehension: verbalized understanding and returned demonstration   HOME EXERCISE PROGRAM: [id number]  ASSESSMENT:  CLINICAL IMPRESSION: Patient continues to report one bowel movement per day, doesn't feel empty, intermittent straining. Pt is using step stool in bathroom and doing abdominal massage. Pt session focused on education mentioned above and manual for abdominal massage. Pt would benefit from additional PT to further address deficits.     OBJECTIVE IMPAIRMENTS decreased coordination, decreased endurance, decreased mobility, decreased strength, increased fascial restrictions, impaired flexibility, improper body mechanics, postural dysfunction, and pain.   ACTIVITY LIMITATIONS continence  PARTICIPATION LIMITATIONS: community activity  PERSONAL FACTORS Age and Time since onset of injury/illness/exacerbation are also affecting patient's functional outcome.   REHAB POTENTIAL: Good  CLINICAL DECISION MAKING: Stable/uncomplicated  EVALUATION COMPLEXITY: Low   GOALS: Goals reviewed with patient? Yes  SHORT TERM GOALS: Target date: 06/11/2022  Pt to be I with HEP.  Baseline: Goal status: INITIAL  2.  Pt will report her BMs are complete due to improved bowel habits and evacuation techniques.  Baseline:  Goal status: INITIAL  3.  Pt to demonstrate at least 3/5 pelvic floor strength for improved pelvic stability and decreased strain at pelvic floor/ decrease leakage.  Baseline:  Goal status: INITIAL   LONG TERM GOALS: Target date: 08/14/2022   Pt to be I with advanced HEP.  Baseline:  Goal  status: INITIAL  2.  Pt to report 50% less straining with bowel movements to improve voiding mechanics and limit strain at pelvic floor.  Baseline:  Goal status: INITIAL  3.  Pt to report no more than one urinary leakage instance per week due to improved pelvic floor strength and/or coordination.  Baseline:  Goal status: INITIAL  4.  Pt to demonstrate at least 4/5 pelvic floor strength for improved pelvic stability and decreased strain at pelvic floor/ decrease leakage.  Baseline:  Goal status: INITIAL   PLAN: PT FREQUENCY:  1x every other week  PT DURATION:  5 sessions  PLANNED INTERVENTIONS: Therapeutic exercises, Therapeutic activity, Neuromuscular re-education, Patient/Family education, Joint mobilization, Dry Needling, Spinal mobilization, Cryotherapy, Moist heat, scar mobilization, Taping, Biofeedback, and Manual therapy  PLAN FOR NEXT SESSION: internal assessment if needed and pt consents   Stacy Gardner, PT, DPT 07/05/239:27 AM    PHYSICAL THERAPY DISCHARGE SUMMARY  Visits from Start of Care: 2  Current functional level related to goals / functional outcomes: Unknown, pt would like to be Dc'd as she is having a knee replacement and would like to restart pelvic PT once finished with PT for knee.    Remaining deficits: Unknown, pt unable to return since last visit   Education / Equipment: HEP   Patient agrees to discharge. Patient goals were partially met. Patient is being discharged due to the patient's request. Due to upcoming knee replacement. Thank you for the referral.   Stacy Gardner, PT, DPT 06/10/2311:07 PM

## 2022-05-30 DIAGNOSIS — N952 Postmenopausal atrophic vaginitis: Secondary | ICD-10-CM | POA: Diagnosis not present

## 2022-05-30 DIAGNOSIS — L900 Lichen sclerosus et atrophicus: Secondary | ICD-10-CM | POA: Diagnosis not present

## 2022-06-11 ENCOUNTER — Encounter: Payer: Medicare Other | Admitting: Physical Therapy

## 2022-06-12 NOTE — Progress Notes (Signed)
Sent message, via epic in basket, requesting order in epic from surgeon     06/12/22 1104  Preop Orders  Has preop orders? No  Name of staff/physician contacted for orders(Indicate phone or IB message) L. Haus, PA-C.

## 2022-06-17 NOTE — Progress Notes (Addendum)
COVID Vaccine received:  '[]'$  No '[x]'$  Yes  Date of any COVID positive Test in last 90 days:  PCP - [name], MD    Medical Clearance - Reginold Agent, NP  05-08-22 notes on chart Cardiologist - Buford Dresser, MD    Cardiac Clearance 05-06-22 Note   EP -  Lars Mage, MD   ENT - Carol Ada, MD  Pulmonologist_ Christinia Gully, MD  Chest x-ray - 05-08-22  on Chart CT cardiac - 08-2021  Epic EKG -  03-26-22  Epic Stress Test -  ECHO - 07-2021  Epic Cardiac Cath -   Pacemaker/ICD device last checked: Date:       '[]'$  N/A Spinal Cord Stimulator:'[]'$  No '[]'$  Yes   Other Implants:   History of Sleep Apnea? '[]'$  No '[x]'$  Yes  '[]'$  unknown Sleep Study Date:  06-2021 CPAP used?- '[]'$  No '[x]'$  Yes  (Instruct to bring their mask & Tubing)  Does the patient monitor blood sugar? '[]'$  No '[]'$  Yes  '[]'$  N/A Does patient have a [employer] or Dexacom? '[]'$  No '[]'$  Yes   Fasting Blood Sugar Ranges-  Checks Blood Sugar _____ times a day  Blood Thinner Instructions: Eliquis,  Hold 2 days Okay,per Dr. Harrell Gave  Activity level:  Can go up a flight of stairs and perform activities of daily living without stopping and without symptoms of chest pain or shortness of breath.'[]'$  No '[]'$  Yes  '[]'$  N/A  Able to do exercises without symptoms '[]'$  No '[]'$  Yes  '[]'$  N/A  Patient able to complete ADLs without assistance '[]'$  No '[]'$  Yes  '[]'$  N/A    Comments:   Anesthesia review: Hx CVA, A.fib, OSA  Patient denies shortness of breath, fever, cough and chest pain at PAT appointment  Patient verbalized understanding and agreement to the Pre-Surgical Instructions that were given to them at this PAT appointment. Patient was also educated of the need to review these PAT instructions again prior to his/her surgery.I reviewed the appropriate phone numbers to call if they have any and questions or concerns.

## 2022-06-17 NOTE — Patient Instructions (Signed)
DUE TO SPACE LIMITATIONS, ONLY TWO VISITORS  (aged 79 and older) ARE ALLOWED TO COME WITH YOU AND STAY IN THE WAITING ROOM DURING YOUR PRE OP AND PROCEDURE.   **NO VISITORS ARE ALLOWED IN THE SHORT STAY AREA OR RECOVERY ROOM!!**  IF YOU WILL BE ADMITTED INTO THE HOSPITAL YOU ARE ALLOWED ONLY FOUR SUPPORT PEOPLE DURING VISITATION HOURS (7 AM -8PM)   The support person(s) must pass our screening, and use Hand sanitizing gel. Visitors GUEST BADGE MUST BE WORN VISIBLY  One adult visitor may remain with you overnight and MUST be in the room by 8 P.M.   You are not required to quarantine at this time prior to your surgery. However, you must do this: Hand Hygiene often Do NOT share personal items Notify your provider if you are in close contact with someone who has COVID or you develop fever 100.4 or greater, new onset of sneezing, cough, sore throat, shortness of breath or body aches.       Your procedure is scheduled on:  Wednesday July 02, 2022  Report to [facility] Main Entrance.  Report to admitting at: 06:15 AM  +++++Call this number if you have any questions or problems the morning of surgery [phone]  Do not eat FOOD After Midnight the night prior to your surgery/procedure.  After Midnight you may have the following liquids until  05:30 AM DAY OF SURGERY  Clear Liquid Diet Water Black Coffee (sugar ok, NO MILK/CREAM OR CREAMERS)  Tea (sugar ok, NO MILK/CREAM OR CREAMERS) regular and decaf                             Plain Jell-O (NO RED)                                           Fruit ices (not with fruit pulp, NO RED)                                     Popsicles (NO RED)                                                                  Juice: apple, WHITE grape, WHITE cranberry Sports drinks like Gatorade (NO RED)                 FOLLOW  ANY ADDITIONAL PRE OP INSTRUCTIONS YOU RECEIVED FROM YOUR SURGEON'S OFFICE!!!   Oral Hygiene is also important to reduce  your risk of infection.        Remember - BRUSH YOUR TEETH THE MORNING OF SURGERY WITH YOUR REGULAR TOOTHPASTE   Take ONLY these medicines the morning of surgery with A SIP OF WATER:  If needed you may use your NASACORT INHALER.    Bring BiPAP mask and tubing day of surgery.                   You may not have any metal on your body including hair pins, jewelry, and body piercing  Do not  wear make-up, lotions, powders, perfumes, or deodorant  Do not wear nail polish including gel and S&S, artificial / acrylic nails, or any other type of covering on natural nails including finger and toenails. If you have artificial nails, gel coating, etc., that needs to be removed by a nail salon, Please have this removed prior to surgery. Not doing so may mean that your surgery could be cancelled or delayed if the Surgeon or anesthesia staff feels like they are unable to monitor you safely.   Do not shave 48 hours prior to surgery to avoid nicks in your skin which may contribute to postoperative infections.   Contacts, Hearing Aids, dentures or bridgework may not be worn into surgery.   You may bring a small overnight bag with you on the day of surgery, only pack items that are not valuable .[facility] IS NOT RESPONSIBLE   FOR VALUABLES THAT ARE LOST OR STOLEN.   DO NOT [place]. PHARMACY WILL DISPENSE MEDICATIONS LISTED ON YOUR MEDICATION LIST TO YOU DURING YOUR ADMISSION [place]!   Patients discharged on the day of surgery will not be allowed to drive home.  Someone NEEDS to stay with you for the first 24 hours after anesthesia.  Special Instructions: Bring a copy of your healthcare power of attorney and living will documents the day of surgery, if you wish to have them scanned into your [facility] Records- EPIC  Please read over the following fact sheets you were given: IF YOU HAVE QUESTIONS ABOUT YOUR PRE-OP INSTRUCTIONS, PLEASE CALL [phone]   ([place])   [place] - Preparing for Surgery Before surgery, you can play an important role.  Because skin is not sterile, your skin needs to be as free of germs as possible.  You can reduce the number of germs on your skin by washing with CHG (chlorahexidine gluconate) soap before surgery.  CHG is an antiseptic cleaner which kills germs and bonds with the skin to continue killing germs even after washing. Please DO NOT use if you have an allergy to CHG or antibacterial soaps.  If your skin becomes reddened/irritated stop using the CHG and inform your nurse when you arrive at Short Stay. Do not shave (including legs and underarms) for at least 48 hours prior to the first CHG shower.  You may shave your face/neck.  Please follow these instructions carefully:  1.  Shower with CHG Soap the night before surgery and the  morning of surgery.  2.  If you choose to wash your hair, wash your hair first as usual with your normal  shampoo.  3.  After you shampoo, rinse your hair and body thoroughly to remove the shampoo.                             4.  Use CHG as you would any other liquid soap.  You can apply chg directly to the skin and wash.  Gently with a scrungie or clean washcloth.  5.  Apply the CHG Soap to your body ONLY FROM THE NECK DOWN.   Do not use on face/ open                           Wound or open sores. Avoid contact with eyes, ears mouth and genitals (private parts).  Wash face,  Genitals (private parts) with your normal soap.             6.  Wash thoroughly, paying special attention to the area where your  surgery  will be performed.  7.  Thoroughly rinse your body with warm water from the neck down.  8.  DO NOT shower/wash with your normal soap after using and rinsing off the CHG Soap.            9.  Pat yourself dry with a clean towel.            10.  Wear clean pajamas.            11.  Place clean sheets on your bed the night of your first shower and do not  sleep  with pets.  ON THE DAY OF SURGERY : Do not apply any lotions/deodorants the morning of surgery.  Please wear clean clothes to the hospital/surgery center.    FAILURE TO FOLLOW THESE INSTRUCTIONS MAY RESULT IN THE CANCELLATION OF YOUR SURGERY  PATIENT SIGNATURE_________________________________  NURSE SIGNATURE__________________________________  ________________________________________________________________________

## 2022-06-19 ENCOUNTER — Encounter (HOSPITAL_COMMUNITY)
Admission: RE | Admit: 2022-06-19 | Discharge: 2022-06-19 | Disposition: A | Discharge status: Home / Self Care | Payer: Medicare Other | Source: Ambulatory Visit | Attending: Specialist | Admitting: Specialist

## 2022-06-19 ENCOUNTER — Other Ambulatory Visit: Payer: Self-pay

## 2022-06-19 ENCOUNTER — Encounter (HOSPITAL_COMMUNITY): Payer: Self-pay

## 2022-06-19 VITALS — BP 132/64 | HR 49 | Temp 98.2°F | Resp 20 | Ht 66.0 in | Wt 166.0 lb

## 2022-06-19 DIAGNOSIS — R7303 Prediabetes: Secondary | ICD-10-CM | POA: Insufficient documentation

## 2022-06-19 DIAGNOSIS — I480 Paroxysmal atrial fibrillation: Secondary | ICD-10-CM | POA: Insufficient documentation

## 2022-06-19 DIAGNOSIS — M1712 Unilateral primary osteoarthritis, left knee: Secondary | ICD-10-CM | POA: Diagnosis not present

## 2022-06-19 DIAGNOSIS — Z8673 Personal history of transient ischemic attack (TIA), and cerebral infarction without residual deficits: Secondary | ICD-10-CM | POA: Insufficient documentation

## 2022-06-19 DIAGNOSIS — Z01812 Encounter for preprocedural laboratory examination: Secondary | ICD-10-CM | POA: Insufficient documentation

## 2022-06-19 DIAGNOSIS — Z01818 Encounter for other preprocedural examination: Secondary | ICD-10-CM

## 2022-06-19 DIAGNOSIS — I10 Essential (primary) hypertension: Secondary | ICD-10-CM | POA: Insufficient documentation

## 2022-06-19 DIAGNOSIS — Z87891 Personal history of nicotine dependence: Secondary | ICD-10-CM | POA: Insufficient documentation

## 2022-06-19 HISTORY — DX: Prediabetes: R73.03

## 2022-06-19 HISTORY — DX: Personal history of urinary calculi: Z87.442

## 2022-06-19 HISTORY — DX: Unspecified osteoarthritis, unspecified site: M19.90

## 2022-06-19 HISTORY — DX: Malignant (primary) neoplasm, unspecified: C80.1

## 2022-06-19 LAB — CBC
HCT: 39.9 % (ref 36.0–46.0)
Hemoglobin: 12.9 g/dL (ref 12.0–15.0)
MCH: 29.8 pg (ref 26.0–34.0)
MCHC: 32.3 g/dL (ref 30.0–36.0)
MCV: 92.1 fL (ref 80.0–100.0)
Platelets: 239 10*3/uL (ref 150–400)
RBC: 4.33 MIL/uL (ref 3.87–5.11)
RDW: 12.8 % (ref 11.5–15.5)
WBC: 5.8 10*3/uL (ref 4.0–10.5)
nRBC: 0 % (ref 0.0–0.2)

## 2022-06-19 LAB — BASIC METABOLIC PANEL
Anion gap: 6 (ref 5–15)
BUN: 14 mg/dL (ref 8–23)
CO2: 25 mmol/L (ref 22–32)
Calcium: 10.7 mg/dL — ABNORMAL HIGH (ref 8.9–10.3)
Chloride: 111 mmol/L (ref 98–111)
Creatinine, Ser: 0.7 mg/dL (ref 0.44–1.00)
GFR, Estimated: >60 mL/min (ref >60)
Glucose, Bld: 99 mg/dL (ref 70–99)
Potassium: 4.7 mmol/L (ref 3.5–5.1)
Sodium: 142 mmol/L (ref 135–145)

## 2022-06-19 LAB — SURGICAL PCR SCREEN
MRSA, PCR: NEGATIVE
Staphylococcus aureus: POSITIVE — AB

## 2022-06-19 LAB — GLUCOSE, CAPILLARY: Glucose-Capillary: 98 mg/dL (ref 70–99)

## 2022-06-19 NOTE — Progress Notes (Signed)
Patient's PCR screen is positive for [facility]. Appropriate notes have been placed on the patient's chart. This note has been routed to Dr. Theda Sers for review. The Patient's surgery is currently Scheduled for: 07-02-22     Leota Jacobsen, BSN, CVRN-BC   Pre-Surgical Testing Nurse [place]  [phone]

## 2022-06-20 NOTE — H&P (View-Only) (Signed)
Anesthesia Chart Review   Case: [id number] Date/Time: 07/02/22 0815   Procedure: TOTAL KNEE ARTHROPLASTY (Left: Knee) - with adductor canal   Anesthesia type: Spinal   Pre-op diagnosis: Left knee osteoarthritis   Location: [facility] ROOM 08 / [facility]   Surgeons: Sydnee Cabal, MD       DISCUSSION:81 y.o. former smoker with h/o HTN, PAF, stroke, left knee OA scheduled for above procedure 07/02/2022 with Dr. Sydnee Cabal.   Pt seen by cardiology 05/06/2022. Per OV note, "According to the Revised Cardiac Risk Index (RCRI), her Perioperative Risk of Major Cardiac Event is (%): 0.9   The patient is not currently having active cardiac symptoms, and they can achieve >4 METs of activity.   According to ACC/AHA Guidelines, no further testing is needed.  Proceed with surgery at acceptable risk.  Our service is available as needed in the peri-operative period. "  Pt reports she was advised to hold Eliquis 72 hours.   Anticipate pt can proceed with planned procedure barring acute status change.   VS: BP 132/64   Pulse (!) 49 Comment: usually 48-52 per patient  Temp 36.8 C (Oral)   Resp 20   Ht '5\' 6"'$  (1.676 m)   Wt 75.3 kg   SpO2 99%   BMI 26.79 kg/m   PROVIDERS: [name], MD is PCP   Cardiologist - Buford Dresser, MD   LABS: Labs reviewed: Acceptable for surgery. (all labs ordered are listed, but only abnormal results are displayed)  Labs Reviewed  SURGICAL PCR SCREEN - Abnormal; Notable for the following components:      Result Value   Staphylococcus aureus POSITIVE (*)    All other components within normal limits  BASIC METABOLIC PANEL - Abnormal; Notable for the following components:   Calcium 10.7 (*)    All other components within normal limits  CBC  GLUCOSE, CAPILLARY     IMAGES:   EKG: 03/26/2022 Rate 54 bpm  Sinus bradycardia with sinus arrhythmia LAD Nonspecific ST abnormality   CV: Echo 08/02/2021 1. Left ventricular ejection fraction, by estimation, is  65 to 70%. Left  ventricular ejection fraction by 3D volume is 68 %. The left ventricle has  normal function. The left ventricle has no regional wall motion  abnormalities. Left ventricular diastolic   parameters are consistent with Grade I diastolic dysfunction (impaired  relaxation). The average left ventricular global longitudinal strain is  -22.5 %. The global longitudinal strain is normal.   2. Normal RV free wall strain. Right ventricular systolic function is  hyperdynamic. The right ventricular size is normal.   3. Normal LA resevoir strain.   4. The mitral valve is grossly normal. Trivial mitral valve  regurgitation.   5. The aortic valve is tricuspid. Aortic valve regurgitation is not  visualized.  Past Medical History:  Diagnosis Date   Arthritis    Bradycardia    Cancer ([place])    skin cancer   History of kidney stones    Hypertension    Paroxysmal atrial fibrillation ([facility])    Pre-diabetes    Stroke (cerebrum) [facility])     Past Surgical History:  Procedure Laterality Date   ANAL RECTAL MANOMETRY N/A 04/16/2022   Procedure: ANO RECTAL MANOMETRY;  Surgeon: Thornton Park, MD;  [facility];  Service: Gastroenterology;  Laterality: N/A;   ATRIAL FIBRILLATION ABLATION N/A 09/17/2021   Procedure: ATRIAL FIBRILLATION ABLATION;  Surgeon: Vickie Epley, MD;  Location: [place] [facility];  Service: Cardiovascular;  Laterality: N/A;  CATARACT EXTRACTION, BILATERAL     LOOP RECORDER INSERTION N/A 05/17/2018   Procedure: LOOP RECORDER INSERTION;  Surgeon: Deboraha Sprang, MD;  Location: [place] [facility];  Service: Cardiovascular;  Laterality: N/A;   LOOP RECORDER INSERTION  05/17/2018   Procedure: LOOP RECORDER INSERTION;  Surgeon: Fay Records, MD;  Location: [place];  Service: Cardiovascular;;   TEE WITHOUT CARDIOVERSION N/A 05/17/2018   Procedure: TRANSESOPHAGEAL ECHOCARDIOGRAM (TEE);  Surgeon: Fay Records, MD;  Location: [facility] ENDOSCOPY;  Service:  Cardiovascular;  Laterality: N/A;   TRIGGER FINGER RELEASE     TUMOR REMOVAL Right    Right hand ganglion removed    MEDICATIONS:  apixaban (ELIQUIS) 5 MG TABS tablet   Ascorbic Acid (VITAMIN C) 1000 MG tablet   atorvastatin (LIPITOR) 40 MG tablet   cholecalciferol (VITAMIN D3) 25 MCG (1000 UNIT) tablet   clobetasol ointment (TEMOVATE) 0.05 %   Coenzyme Q10 (COQ-10) 100 MG CAPS   Coral Calcium 500 MG CAPS   Magnesium 250 MG TABS   triamcinolone (NASACORT ALLERGY 24HR) 55 MCG/ACT AERO nasal inhaler   TURMERIC PO   No current facility-administered medications for this encounter.     Konrad Felix Ward, PA-C [facility] Pre-Surgical Testing [phone]

## 2022-06-20 NOTE — Progress Notes (Signed)
Anesthesia Chart Review   Case: [id number] Date/Time: 07/02/22 0815   Procedure: TOTAL KNEE ARTHROPLASTY (Left: Knee) - with adductor canal   Anesthesia type: Spinal   Pre-op diagnosis: Left knee osteoarthritis   Location: [facility] ROOM 08 / [facility]   Surgeons: Sydnee Cabal, MD       DISCUSSION:81 y.o. former smoker with h/o HTN, PAF, stroke, left knee OA scheduled for above procedure 07/02/2022 with Dr. Sydnee Cabal.   Pt seen by cardiology 05/06/2022. Per OV note, "According to the Revised Cardiac Risk Index (RCRI), her Perioperative Risk of Major Cardiac Event is (%): 0.9   The patient is not currently having active cardiac symptoms, and they can achieve >4 METs of activity.   According to ACC/AHA Guidelines, no further testing is needed.  Proceed with surgery at acceptable risk.  Our service is available as needed in the peri-operative period. "  Pt reports she was advised to hold Eliquis 72 hours.   Anticipate pt can proceed with planned procedure barring acute status change.   VS: BP 132/64   Pulse (!) 49 Comment: usually 48-52 per patient  Temp 36.8 C (Oral)   Resp 20   Ht '5\' 6"'$  (1.676 m)   Wt 75.3 kg   SpO2 99%   BMI 26.79 kg/m   PROVIDERS: [name], MD is PCP   Cardiologist - Buford Dresser, MD   LABS: Labs reviewed: Acceptable for surgery. (all labs ordered are listed, but only abnormal results are displayed)  Labs Reviewed  SURGICAL PCR SCREEN - Abnormal; Notable for the following components:      Result Value   Staphylococcus aureus POSITIVE (*)    All other components within normal limits  BASIC METABOLIC PANEL - Abnormal; Notable for the following components:   Calcium 10.7 (*)    All other components within normal limits  CBC  GLUCOSE, CAPILLARY     IMAGES:   EKG: 03/26/2022 Rate 54 bpm  Sinus bradycardia with sinus arrhythmia LAD Nonspecific ST abnormality   CV: Echo 08/02/2021 1. Left ventricular ejection fraction, by estimation, is  65 to 70%. Left  ventricular ejection fraction by 3D volume is 68 %. The left ventricle has  normal function. The left ventricle has no regional wall motion  abnormalities. Left ventricular diastolic   parameters are consistent with Grade I diastolic dysfunction (impaired  relaxation). The average left ventricular global longitudinal strain is  -22.5 %. The global longitudinal strain is normal.   2. Normal RV free wall strain. Right ventricular systolic function is  hyperdynamic. The right ventricular size is normal.   3. Normal LA resevoir strain.   4. The mitral valve is grossly normal. Trivial mitral valve  regurgitation.   5. The aortic valve is tricuspid. Aortic valve regurgitation is not  visualized.  Past Medical History:  Diagnosis Date   Arthritis    Bradycardia    Cancer ([place])    skin cancer   History of kidney stones    Hypertension    Paroxysmal atrial fibrillation (HCC)    Pre-diabetes    Stroke (cerebrum) [facility])     Past Surgical History:  Procedure Laterality Date   ANAL RECTAL MANOMETRY N/A 04/16/2022   Procedure: ANO RECTAL MANOMETRY;  Surgeon: Thornton Park, MD;  [facility];  Service: Gastroenterology;  Laterality: N/A;   ATRIAL FIBRILLATION ABLATION N/A 09/17/2021   Procedure: ATRIAL FIBRILLATION ABLATION;  Surgeon: Vickie Epley, MD;  Location: [place] [facility];  Service: Cardiovascular;  Laterality: N/A;  CATARACT EXTRACTION, BILATERAL     LOOP RECORDER INSERTION N/A 05/17/2018   Procedure: LOOP RECORDER INSERTION;  Surgeon: Deboraha Sprang, MD;  Location: [place] [facility];  Service: Cardiovascular;  Laterality: N/A;   LOOP RECORDER INSERTION  05/17/2018   Procedure: LOOP RECORDER INSERTION;  Surgeon: Fay Records, MD;  Location: [place];  Service: Cardiovascular;;   TEE WITHOUT CARDIOVERSION N/A 05/17/2018   Procedure: TRANSESOPHAGEAL ECHOCARDIOGRAM (TEE);  Surgeon: Fay Records, MD;  Location: [facility] ENDOSCOPY;  Service:  Cardiovascular;  Laterality: N/A;   TRIGGER FINGER RELEASE     TUMOR REMOVAL Right    Right hand ganglion removed    MEDICATIONS:  apixaban (ELIQUIS) 5 MG TABS tablet   Ascorbic Acid (VITAMIN C) 1000 MG tablet   atorvastatin (LIPITOR) 40 MG tablet   cholecalciferol (VITAMIN D3) 25 MCG (1000 UNIT) tablet   clobetasol ointment (TEMOVATE) 0.05 %   Coenzyme Q10 (COQ-10) 100 MG CAPS   Coral Calcium 500 MG CAPS   Magnesium 250 MG TABS   triamcinolone (NASACORT ALLERGY 24HR) 55 MCG/ACT AERO nasal inhaler   TURMERIC PO   No current facility-administered medications for this encounter.     Konrad Felix Ward, PA-C [facility] [phone]

## 2022-06-23 NOTE — Anesthesia Preprocedure Evaluation (Addendum)
Anesthesia Evaluation  Patient identified by MRN, date of birth, ID band Patient awake    Reviewed: Allergy & Precautions, NPO status , Patient's Chart, lab work & pertinent test results  Airway Mallampati: II  TM Distance: >3 FB Neck ROM: Full    Dental no notable dental hx.    Pulmonary neg pulmonary ROS, former smoker,    Pulmonary exam normal breath sounds clear to auscultation       Cardiovascular hypertension, Normal cardiovascular exam Rhythm:Regular Rate:Normal     Neuro/Psych TIACVA negative psych ROS   GI/Hepatic negative GI ROS, Neg liver ROS,   Endo/Other  negative endocrine ROS  Renal/GU negative Renal ROS  negative genitourinary   Musculoskeletal negative musculoskeletal ROS (+)   Abdominal   Peds negative pediatric ROS (+)  Hematology negative hematology ROS (+)   Anesthesia Other Findings   Reproductive/Obstetrics negative OB ROS                            Anesthesia Physical Anesthesia Plan  ASA: 3  Anesthesia Plan: Spinal   Post-op Pain Management: Regional block*   Induction: Intravenous  PONV Risk Score and Plan: 2 and Ondansetron, Propofol infusion, Dexamethasone and Treatment may vary due to age or medical condition  Airway Management Planned: Simple Face Mask  Additional Equipment:   Intra-op Plan:   Post-operative Plan:   Informed Consent: I have reviewed the patients History and Physical, chart, labs and discussed the procedure including the risks, benefits and alternatives for the proposed anesthesia with the patient or authorized representative who has indicated his/her understanding and acceptance.     Dental advisory given  Plan Discussed with: CRNA and Surgeon  Anesthesia Plan Comments: (See PAT note 06/19/2022)       Anesthesia Quick Evaluation

## 2022-06-23 NOTE — Telephone Encounter (Signed)
Kristin Lowe. Noon "Kristin Lowe" 81 year old female recently saw you in clinic on 05/06/2022.  [facility] is now requesting a 3-day Eliquis hold prior to her left total knee arthroplasty.  Please advise.  Thank you for your help.  Please direct your response to [facility] preop pool.  Jossie Ng. Laquenta Whitsell NP-C     06/23/2022, 12:56 PM [place] [address] Office [phone] Fax [phone]

## 2022-06-23 NOTE — Telephone Encounter (Signed)
Kerri with [facility] called about clearance for the pt and the recommendations in regard to blood thinner. Per Marianna Fuss the surgeon needs to have the pt hold her blood thinner x 3 days and not 2 days prior as the pt is having spinal anesthesia.   I assured Marianna Fuss that I will have pre op provider an pharm-d review. Once new recommendations are available we will fax new notes

## 2022-06-26 NOTE — Telephone Encounter (Signed)
Patient with hx of CVA and afib. Will need Dr. Harrell Gave to weight in on 3 day hold. Crcl is 97m/min

## 2022-06-27 ENCOUNTER — Encounter: Payer: Self-pay | Admitting: Cardiology

## 2022-06-27 NOTE — Telephone Encounter (Signed)
error 

## 2022-06-27 NOTE — Telephone Encounter (Signed)
   Patient Name: Kristin Lowe  DOB: 02-05-1941 MRN: [id number]  Primary Cardiologist: Buford Dresser, MD  Chart reviewed as part of pre-operative protocol coverage. Pre-op clearance already addressed by colleagues in earlier phone notes. To summarize recommendations:  -Preoperative cardiovascular evaluation According to the Revised Cardiac Risk Index (RCRI), her Perioperative Risk of Major Cardiac Event is (%): 0.9   The patient is not currently having active cardiac symptoms, and they can achieve >4 METs of activity.   According to ACC/AHA Guidelines, no further testing is needed.  Proceed with surgery at acceptable risk.  Our service is available as needed in the peri-operative period.      The patient can hold apixaban preoperatively. She should restart apixaban as soon as deemed ready by surgery post operatively. -Dr. Harrell Gave 05/06/22  Okay to hold Eliquis x 3 days per Dr. Lovena Le  Will route this bundled recommendation to requesting provider via [place] fax function and remove from pre-op pool. Please call with questions.  Elgie Collard, PA-C 06/27/2022, 4:47 PM

## 2022-06-27 NOTE — H&P (Signed)
TOTAL KNEE ADMISSION H&P  Patient is being admitted for left total knee arthroplasty.  Subjective:  Chief Complaint:left knee pain.  HPI: Kristin Lowe, 81 y.o. female, has a history of pain and functional disability in the left knee due to arthritis and has failed non-surgical conservative treatments for greater than 12 weeks to includeNSAID's and/or analgesics, corticosteriod injections, and activity modification.  Onset of symptoms was gradual, starting 3 years ago with gradually worsening course since that time. The patient noted no past surgery on the left knee(s).  Patient currently rates pain in the left knee(s) at 4 out of 10 with activity. Patient has worsening of pain with activity and weight bearing, pain that interferes with activities of daily living, and pain with passive range of motion.  Patient has evidence of periarticular osteophytes and joint space narrowing by imaging studies. This patient has had  No previous injury . There is no active infection.  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    Elevated coronary artery calcium score 10/25/2021   Chronic cough 09/11/2021   OSA (obstructive sleep apnea) 09/09/2021   History of CVA (cerebrovascular accident) 08/29/2020   Paroxysmal atrial fibrillation ([place]) 08/29/2020   TIA (transient ischemic attack) 05/15/2018   Sinus bradycardia 05/15/2018   Essential hypertension 05/15/2018   Hyperglycemia 05/15/2018   Multiple thyroid nodules 09/17/2017   Other fatigue 09/17/2017   Past Medical History:  Diagnosis Date   Arthritis    Bradycardia    Cancer ([place])    skin cancer   History of kidney stones    Hypertension    Paroxysmal atrial fibrillation ([place])    Pre-diabetes    Stroke (cerebrum) ([place])     Past Surgical History:  Procedure Laterality Date   ANAL RECTAL MANOMETRY N/A 04/16/2022   Procedure: ANO RECTAL MANOMETRY;  Surgeon: Thornton Park, MD;  [facility];  Service: Gastroenterology;  Laterality: N/A;   ATRIAL FIBRILLATION ABLATION N/A 09/17/2021   Procedure: ATRIAL FIBRILLATION ABLATION;  Surgeon: Vickie Epley, MD;  Location: [place] [facility] LAB;  Service: Cardiovascular;  Laterality: N/A;   CATARACT EXTRACTION, BILATERAL     LOOP RECORDER INSERTION N/A 05/17/2018   Procedure: LOOP RECORDER INSERTION;  Surgeon: Deboraha Sprang, MD;  Location: [place] [facility] LAB;  Service: Cardiovascular;  Laterality: N/A;   LOOP RECORDER INSERTION  05/17/2018   Procedure: LOOP RECORDER INSERTION;  Surgeon: Fay Records, MD;  Location: [place];  Service: Cardiovascular;;   TEE WITHOUT CARDIOVERSION N/A 05/17/2018   Procedure: TRANSESOPHAGEAL ECHOCARDIOGRAM (TEE);  Surgeon: Fay Records, MD;  Location: [place];  Service: Cardiovascular;  Laterality: N/A;   TRIGGER FINGER RELEASE     TUMOR REMOVAL Right    Right hand ganglion removed    No current facility-administered medications for this encounter.   Current Outpatient Medications  Medication Sig Dispense Refill Last Dose   apixaban (ELIQUIS) 5 MG TABS tablet Take 1 tablet (5 mg total) by mouth 2 (two) times daily. 180 tablet 1    Ascorbic Acid (VITAMIN C) 1000 MG tablet Take 1,000 mg by mouth 4 (four) times a week.      atorvastatin (LIPITOR) 40 MG tablet Take 1 tablet (40 mg total) by mouth daily at 6 PM. 30 tablet 3    cholecalciferol (VITAMIN D3) 25 MCG (1000 UNIT) tablet Take 1,000 Units by mouth daily.      clobetasol ointment (TEMOVATE) 3.23 % Apply 1 application  topically daily as needed (skin irritation).      Coenzyme Q10 (COQ-10) 100 MG CAPS Take 100 mg by mouth daily.      Coral Calcium 500 MG CAPS Take 500 mg by mouth daily.      Magnesium 250 MG TABS Take 250 mg by mouth daily.      triamcinolone (NASACORT ALLERGY 24HR) 55 MCG/ACT AERO nasal inhaler Place 2 sprays into the nose daily as needed (allergies).      TURMERIC PO Take 1,000 mg by mouth daily.      No  Known Allergies  Social History   Tobacco Use   Smoking status: Former    Packs/day: 2.00    Years: 30.00    Total pack years: 60.00    Types: Cigarettes    Quit date: 11/24/1990    Years since quitting: 31.6   Smokeless tobacco: Never  Substance Use Topics   Alcohol use: No    Family History  Problem Relation Age of Onset   Breast cancer Mother    Diabetes Mother    Thyroid disease Mother    Heart disease Father    Hypertension Sister    Thyroid disease Sister    Diabetes Brother    Hypertension Brother      Review of Systems  All other systems reviewed and are negative.   Objective:  Physical Exam Constitutional:      Appearance: Normal appearance.  HENT:     Head: Normocephalic and atraumatic.  Cardiovascular:     Rate and Rhythm: Normal rate and regular rhythm.     Pulses: Normal pulses.     Heart sounds: Normal heart sounds.  Pulmonary:     Effort: Pulmonary effort is normal.     Breath sounds: Normal breath sounds.  Musculoskeletal:     Comments: Examination of the left knee reveals varus deformity. Range of motion is 5 to 110 degrees. She has diffuse tenderness. She has a small effusion. 2+ crepitation. Knee is stable. Foot and ankle appear to be negative.    Skin:    General: Skin is warm and dry.     Capillary Refill: Capillary refill takes less than 2 seconds.  Neurological:     General: No focal deficit present.     Mental Status: She is alert and oriented to person, place, and time.     Vital signs in last 24 hours:    Labs:   Estimated body mass index is 26.79 kg/m as calculated from the following:   Height as of 06/19/22: '5\' 6"'$  (1.676 m).   Weight as of 06/19/22: 75.3 kg.   Imaging Review Plain radiographs demonstrate severe degenerative joint disease of the left knee(s). The overall alignment ismild varus. The bone quality appears to be good for age and reported activity level.      Assessment/Plan:  End stage arthritis, left  knee   The patient history, physical examination, clinical judgment of the provider and imaging studies are consistent with end stage degenerative joint disease of the left knee(s) and total knee arthroplasty is deemed medically necessary. The treatment options including medical management, injection therapy arthroscopy and arthroplasty were discussed at length. The risks and benefits of total knee arthroplasty were presented and reviewed. The risks due to aseptic loosening, infection, stiffness, patella tracking problems, thromboembolic complications and other imponderables were discussed. The patient acknowledged the explanation, agreed to proceed with the plan and consent was signed. Patient is being admitted for inpatient treatment for surgery, pain  control, PT, OT, prophylactic antibiotics, VTE prophylaxis, progressive ambulation and ADL's and discharge planning. The patient is planning to be discharged  home with family and will be doing OPPT at our office     Patient's anticipated LOS is less than 2 midnights, meeting these requirements: - Younger than 62 - Lives within 1 hour of care - Has a competent adult at home to recover with post-op recover - NO history of  - Chronic pain requiring opiods  - Diabetes  - Coronary Artery Disease  - Heart failure  - Heart attack  - Stroke  - DVT/VTE  - Cardiac arrhythmia  - Respiratory Failure/COPD  - Renal failure  - Anemia  - Advanced Liver disease

## 2022-06-30 ENCOUNTER — Telehealth: Payer: Self-pay | Admitting: *Deleted

## 2022-06-30 NOTE — Telephone Encounter (Signed)
Received a call from the patient stating that her mask is making "gurgling sounds." Informed her that her current download does not show that she is having a mask leak. If anything the noise may be coming from her pressures. However her AHI is currently 1.7 which means that the current pressure settings are working for her. She continued to inform me that she is having mask fit issues with two different mask types that she has. I recommended that she call to speak with Anderson Malta at [facility]. Maybe she can go back in for a mask fit.

## 2022-07-02 ENCOUNTER — Ambulatory Visit (HOSPITAL_COMMUNITY): Payer: Medicare Other | Admitting: Physician Assistant

## 2022-07-02 ENCOUNTER — Other Ambulatory Visit: Payer: Self-pay

## 2022-07-02 ENCOUNTER — Observation Stay (HOSPITAL_COMMUNITY)
Admission: RE | Admit: 2022-07-02 | Discharge: 2022-07-07 | Disposition: A | Discharge status: Home / Self Care | Payer: Medicare Other | Attending: Specialist | Admitting: Specialist

## 2022-07-02 ENCOUNTER — Encounter (HOSPITAL_COMMUNITY): Payer: Self-pay | Admitting: Specialist

## 2022-07-02 ENCOUNTER — Encounter (HOSPITAL_COMMUNITY)
Admission: RE | Disposition: A | Discharge status: Home / Self Care | Payer: Self-pay | Source: Home / Self Care | Attending: Specialist

## 2022-07-02 ENCOUNTER — Ambulatory Visit (HOSPITAL_BASED_OUTPATIENT_CLINIC_OR_DEPARTMENT_OTHER): Payer: Medicare Other | Admitting: Anesthesiology

## 2022-07-02 DIAGNOSIS — I10 Essential (primary) hypertension: Secondary | ICD-10-CM

## 2022-07-02 DIAGNOSIS — M1712 Unilateral primary osteoarthritis, left knee: Principal | ICD-10-CM

## 2022-07-02 DIAGNOSIS — Z87891 Personal history of nicotine dependence: Secondary | ICD-10-CM | POA: Diagnosis not present

## 2022-07-02 DIAGNOSIS — Z8673 Personal history of transient ischemic attack (TIA), and cerebral infarction without residual deficits: Secondary | ICD-10-CM | POA: Diagnosis not present

## 2022-07-02 DIAGNOSIS — Z7901 Long term (current) use of anticoagulants: Secondary | ICD-10-CM | POA: Diagnosis not present

## 2022-07-02 DIAGNOSIS — I480 Paroxysmal atrial fibrillation: Secondary | ICD-10-CM | POA: Insufficient documentation

## 2022-07-02 DIAGNOSIS — R7303 Prediabetes: Secondary | ICD-10-CM

## 2022-07-02 DIAGNOSIS — Z79899 Other long term (current) drug therapy: Secondary | ICD-10-CM | POA: Insufficient documentation

## 2022-07-02 DIAGNOSIS — G8918 Other acute postprocedural pain: Secondary | ICD-10-CM | POA: Diagnosis not present

## 2022-07-02 DIAGNOSIS — Z85828 Personal history of other malignant neoplasm of skin: Secondary | ICD-10-CM | POA: Insufficient documentation

## 2022-07-02 DIAGNOSIS — Z01818 Encounter for other preprocedural examination: Secondary | ICD-10-CM

## 2022-07-02 HISTORY — PX: TOTAL KNEE ARTHROPLASTY: SHX125

## 2022-07-02 LAB — TYPE AND SCREEN
ABO/RH(D): A POS
Antibody Screen: NEGATIVE

## 2022-07-02 LAB — CBC
HCT: 40.4 % (ref 36.0–46.0)
Hemoglobin: 12.6 g/dL (ref 12.0–15.0)
MCH: 29.4 pg (ref 26.0–34.0)
MCHC: 31.2 g/dL (ref 30.0–36.0)
MCV: 94.4 fL (ref 80.0–100.0)
Platelets: 233 10*3/uL (ref 150–400)
RBC: 4.28 MIL/uL (ref 3.87–5.11)
RDW: 13.1 % (ref 11.5–15.5)
WBC: 6.4 10*3/uL (ref 4.0–10.5)
nRBC: 0 % (ref 0.0–0.2)

## 2022-07-02 LAB — ABO/RH: ABO/RH(D): A POS

## 2022-07-02 LAB — CREATININE, SERUM
Creatinine, Ser: 0.79 mg/dL (ref 0.44–1.00)
GFR, Estimated: >60 mL/min (ref >60)

## 2022-07-02 SURGERY — ARTHROPLASTY, KNEE, TOTAL
Anesthesia: Spinal | Site: Knee | Laterality: Left

## 2022-07-02 MED ORDER — EPHEDRINE SULFATE (PRESSORS) 50 MG/ML IJ SOLN
INTRAMUSCULAR | Status: DC | PRN
  Administered 2022-07-02 (×2): 5 mg via INTRAVENOUS
  Administered 2022-07-02: 10 mg via INTRAVENOUS
  Administered 2022-07-02: 5 mg via INTRAVENOUS

## 2022-07-02 MED ORDER — ONDANSETRON HCL 4 MG/2ML IJ SOLN: 4.0000 mg | Freq: Once | INTRAMUSCULAR | Status: DC | PRN

## 2022-07-02 MED ORDER — ACETAMINOPHEN 500 MG PO TABS
ORAL_TABLET | ORAL | Status: AC
  Filled 2022-07-02: qty 2

## 2022-07-02 MED ORDER — HYDROMORPHONE HCL 1 MG/ML IJ SOLN
0.2500 mg | INTRAMUSCULAR | Status: DC | PRN
  Administered 2022-07-02 (×2): 0.5 mg via INTRAVENOUS

## 2022-07-02 MED ORDER — LACTATED RINGERS IV SOLN: INTRAVENOUS | Status: DC

## 2022-07-02 MED ORDER — DIPHENHYDRAMINE HCL 12.5 MG/5ML PO ELIX
12.5000 mg | ORAL_SOLUTION | ORAL | Status: DC | PRN
  Administered 2022-07-02: 25 mg via ORAL
  Filled 2022-07-02: qty 10

## 2022-07-02 MED ORDER — PROPOFOL 500 MG/50ML IV EMUL
INTRAVENOUS | Status: AC
  Filled 2022-07-02: qty 50

## 2022-07-02 MED ORDER — BISACODYL 5 MG PO TBEC
5.0000 mg | DELAYED_RELEASE_TABLET | Freq: Every day | ORAL | Status: DC | PRN
  Administered 2022-07-06: 5 mg via ORAL
  Filled 2022-07-02: qty 1

## 2022-07-02 MED ORDER — ONDANSETRON HCL 4 MG/2ML IJ SOLN
INTRAMUSCULAR | Status: AC
  Filled 2022-07-02: qty 2

## 2022-07-02 MED ORDER — 0.9 % SODIUM CHLORIDE (POUR BTL) OPTIME
TOPICAL | Status: DC | PRN
  Administered 2022-07-02: 1000 mL

## 2022-07-02 MED ORDER — SODIUM CHLORIDE 0.9 % IR SOLN
Status: DC | PRN
  Administered 2022-07-02: 1000 mL

## 2022-07-02 MED ORDER — TRAMADOL HCL 50 MG PO TABS
50.0000 mg | ORAL_TABLET | Freq: Four times a day (QID) | ORAL | Status: DC
  Administered 2022-07-02 – 2022-07-07 (×14): 50 mg via ORAL
  Filled 2022-07-02 (×14): qty 1

## 2022-07-02 MED ORDER — FENTANYL CITRATE (PF) 100 MCG/2ML IJ SOLN
INTRAMUSCULAR | Status: DC | PRN
  Administered 2022-07-02 (×2): 50 ug via INTRAVENOUS

## 2022-07-02 MED ORDER — CEFAZOLIN SODIUM-DEXTROSE 1-4 GM/50ML-% IV SOLN
1.0000 g | Freq: Four times a day (QID) | INTRAVENOUS | Status: AC
  Administered 2022-07-02 – 2022-07-03 (×3): 1 g via INTRAVENOUS
  Filled 2022-07-02 (×3): qty 50

## 2022-07-02 MED ORDER — ATORVASTATIN CALCIUM 40 MG PO TABS
40.0000 mg | ORAL_TABLET | Freq: Every day | ORAL | Status: DC
  Administered 2022-07-02 – 2022-07-06 (×5): 40 mg via ORAL
  Filled 2022-07-02 (×5): qty 1

## 2022-07-02 MED ORDER — APIXABAN 5 MG PO TABS
5.0000 mg | ORAL_TABLET | Freq: Two times a day (BID) | ORAL | Status: DC
  Administered 2022-07-03 – 2022-07-07 (×9): 5 mg via ORAL
  Filled 2022-07-02 (×9): qty 1

## 2022-07-02 MED ORDER — HYDROMORPHONE HCL 1 MG/ML IJ SOLN: 0.5000 mg | INTRAMUSCULAR | Status: DC | PRN

## 2022-07-02 MED ORDER — TRIAMCINOLONE ACETONIDE 55 MCG/ACT NA AERO
2.0000 | INHALATION_SPRAY | Freq: Every day | NASAL | Status: DC | PRN
  Filled 2022-07-02: qty 21.6

## 2022-07-02 MED ORDER — EPHEDRINE 5 MG/ML INJ
INTRAVENOUS | Status: AC
  Filled 2022-07-02: qty 5

## 2022-07-02 MED ORDER — SODIUM CHLORIDE (PF) 0.9 % IJ SOLN
INTRAMUSCULAR | Status: DC | PRN
  Administered 2022-07-02: 60 mL

## 2022-07-02 MED ORDER — METHOCARBAMOL 500 MG IVPB - SIMPLE MED
INTRAVENOUS | Status: AC
  Filled 2022-07-02: qty 55

## 2022-07-02 MED ORDER — OXYCODONE HCL 5 MG/5ML PO SOLN: 5.0000 mg | Freq: Once | ORAL | Status: AC | PRN

## 2022-07-02 MED ORDER — ONDANSETRON HCL 4 MG PO TABS
4.0000 mg | ORAL_TABLET | Freq: Four times a day (QID) | ORAL | Status: DC | PRN
  Administered 2022-07-03: 4 mg via ORAL
  Filled 2022-07-02: qty 1

## 2022-07-02 MED ORDER — HYDROMORPHONE HCL 1 MG/ML IJ SOLN
INTRAMUSCULAR | Status: AC
  Filled 2022-07-02: qty 1

## 2022-07-02 MED ORDER — POVIDONE-IODINE 10 % EX SWAB
2.0000 | Freq: Once | CUTANEOUS | Status: AC
  Administered 2022-07-02: 2 via TOPICAL

## 2022-07-02 MED ORDER — DEXAMETHASONE SODIUM PHOSPHATE 10 MG/ML IJ SOLN
INTRAMUSCULAR | Status: AC
  Filled 2022-07-02: qty 1

## 2022-07-02 MED ORDER — BUPIVACAINE LIPOSOME 1.3 % IJ SUSP
INTRAMUSCULAR | Status: AC
  Filled 2022-07-02: qty 20

## 2022-07-02 MED ORDER — MAGNESIUM CITRATE PO SOLN
1.0000 | Freq: Once | ORAL | Status: AC | PRN
Start: 2022-07-02 — End: 2022-07-06
  Administered 2022-07-06: 1 via ORAL
  Filled 2022-07-02: qty 296

## 2022-07-02 MED ORDER — BUPIVACAINE LIPOSOME 1.3 % IJ SUSP
INTRAMUSCULAR | Status: DC | PRN
  Administered 2022-07-02: 20 mL

## 2022-07-02 MED ORDER — OXYCODONE HCL 5 MG PO TABS
5.0000 mg | ORAL_TABLET | Freq: Once | ORAL | Status: AC | PRN
  Administered 2022-07-02: 5 mg via ORAL

## 2022-07-02 MED ORDER — ROPIVACAINE HCL 5 MG/ML IJ SOLN
INTRAMUSCULAR | Status: DC | PRN
  Administered 2022-07-02: 20 mL via PERINEURAL

## 2022-07-02 MED ORDER — ONDANSETRON HCL 4 MG/2ML IJ SOLN: 4.0000 mg | Freq: Four times a day (QID) | INTRAMUSCULAR | Status: DC | PRN

## 2022-07-02 MED ORDER — ACETAMINOPHEN 10 MG/ML IV SOLN
INTRAVENOUS | Status: AC
  Filled 2022-07-02: qty 100

## 2022-07-02 MED ORDER — SODIUM CHLORIDE (PF) 0.9 % IJ SOLN
INTRAMUSCULAR | Status: AC
  Filled 2022-07-02: qty 50

## 2022-07-02 MED ORDER — SODIUM CHLORIDE (PF) 0.9 % IJ SOLN
INTRAMUSCULAR | Status: AC
  Filled 2022-07-02: qty 10

## 2022-07-02 MED ORDER — PROPOFOL 500 MG/50ML IV EMUL
INTRAVENOUS | Status: DC | PRN
  Administered 2022-07-02: 100 ug/kg/min via INTRAVENOUS

## 2022-07-02 MED ORDER — HYDROMORPHONE HCL 2 MG PO TABS
2.0000 mg | ORAL_TABLET | ORAL | Status: DC | PRN
  Administered 2022-07-03: 2 mg via ORAL
  Filled 2022-07-02: qty 1

## 2022-07-02 MED ORDER — ONDANSETRON HCL 4 MG/2ML IJ SOLN
INTRAMUSCULAR | Status: DC | PRN
  Administered 2022-07-02: 4 mg via INTRAVENOUS

## 2022-07-02 MED ORDER — SODIUM CHLORIDE 0.9 % IV SOLN: INTRAVENOUS | Status: DC

## 2022-07-02 MED ORDER — OXYCODONE HCL 5 MG PO TABS
5.0000 mg | ORAL_TABLET | ORAL | Status: DC | PRN
  Administered 2022-07-02 – 2022-07-03 (×5): 10 mg via ORAL
  Administered 2022-07-04: 5 mg via ORAL
  Administered 2022-07-04 – 2022-07-06 (×10): 10 mg via ORAL
  Administered 2022-07-06: 5 mg via ORAL
  Filled 2022-07-02 (×2): qty 2
  Filled 2022-07-02: qty 1
  Filled 2022-07-02 (×15): qty 2

## 2022-07-02 MED ORDER — OXYCODONE HCL 5 MG PO TABS
ORAL_TABLET | ORAL | Status: AC
  Filled 2022-07-02: qty 1

## 2022-07-02 MED ORDER — BUPIVACAINE LIPOSOME 1.3 % IJ SUSP: 20.0000 mL | Freq: Once | INTRAMUSCULAR | Status: DC

## 2022-07-02 MED ORDER — SENNOSIDES-DOCUSATE SODIUM 8.6-50 MG PO TABS
1.0000 | ORAL_TABLET | Freq: Every evening | ORAL | Status: DC | PRN
  Administered 2022-07-06: 1 via ORAL
  Filled 2022-07-02: qty 1

## 2022-07-02 MED ORDER — ORAL CARE MOUTH RINSE: 15.0000 mL | Freq: Once | OROMUCOSAL | Status: AC

## 2022-07-02 MED ORDER — FENTANYL CITRATE (PF) 100 MCG/2ML IJ SOLN
INTRAMUSCULAR | Status: AC
  Filled 2022-07-02: qty 2

## 2022-07-02 MED ORDER — ACETAMINOPHEN 500 MG PO TABS
1000.0000 mg | ORAL_TABLET | Freq: Four times a day (QID) | ORAL | Status: AC
  Administered 2022-07-02 – 2022-07-03 (×4): 1000 mg via ORAL
  Filled 2022-07-02 (×4): qty 2

## 2022-07-02 MED ORDER — CHLORHEXIDINE GLUCONATE 0.12 % MT SOLN
15.0000 mL | Freq: Once | OROMUCOSAL | Status: AC
  Administered 2022-07-02: 15 mL via OROMUCOSAL

## 2022-07-02 MED ORDER — ACETAMINOPHEN 10 MG/ML IV SOLN
1000.0000 mg | Freq: Once | INTRAVENOUS | Status: DC | PRN
  Administered 2022-07-02: 1000 mg via INTRAVENOUS

## 2022-07-02 MED ORDER — METHOCARBAMOL 500 MG PO TABS
500.0000 mg | ORAL_TABLET | Freq: Four times a day (QID) | ORAL | Status: DC | PRN
  Administered 2022-07-02 – 2022-07-07 (×15): 500 mg via ORAL
  Filled 2022-07-02 (×15): qty 1

## 2022-07-02 MED ORDER — ACETAMINOPHEN 325 MG PO TABS
325.0000 mg | ORAL_TABLET | Freq: Four times a day (QID) | ORAL | Status: DC | PRN
  Administered 2022-07-05 – 2022-07-06 (×2): 650 mg via ORAL
  Filled 2022-07-02 (×2): qty 2

## 2022-07-02 MED ORDER — METHOCARBAMOL 500 MG IVPB - SIMPLE MED
500.0000 mg | Freq: Four times a day (QID) | INTRAVENOUS | Status: DC | PRN
  Administered 2022-07-02: 500 mg via INTRAVENOUS

## 2022-07-02 MED ORDER — CEFAZOLIN SODIUM-DEXTROSE 2-4 GM/100ML-% IV SOLN
2.0000 g | INTRAVENOUS | Status: AC
  Administered 2022-07-02: 2 g via INTRAVENOUS
  Filled 2022-07-02: qty 100

## 2022-07-02 MED ORDER — DEXAMETHASONE SODIUM PHOSPHATE 10 MG/ML IJ SOLN
8.0000 mg | Freq: Once | INTRAMUSCULAR | Status: AC
  Administered 2022-07-02: 8 mg via INTRAVENOUS

## 2022-07-02 MED ORDER — TRANEXAMIC ACID-NACL 1000-0.7 MG/100ML-% IV SOLN
1000.0000 mg | INTRAVENOUS | Status: AC
  Administered 2022-07-02: 1000 mg via INTRAVENOUS
  Filled 2022-07-02: qty 100

## 2022-07-02 MED ORDER — BACITRACIN ZINC 500 UNIT/GM EX OINT
TOPICAL_OINTMENT | CUTANEOUS | Status: AC
  Filled 2022-07-02: qty 28.35

## 2022-07-02 SURGICAL SUPPLY — 55 items
ATTUNE MED DOME PAT 32 KNEE (Knees) ×1 IMPLANT
ATTUNE PSFEM LTSZ5 NARCEM KNEE (Femur) ×1 IMPLANT
ATTUNE PSRP INSR SZ5 8 KNEE (Insert) ×1 IMPLANT
BAG COUNTER SPONGE SURGICOUNT (BAG) ×2 IMPLANT
BAG ZIPLOCK 12X15 (MISCELLANEOUS) ×2 IMPLANT
BASE TIBIAL ROT PLAT SZ 5 KNEE (Knees) IMPLANT
BLADE SAG 18X100X1.27 (BLADE) ×2 IMPLANT
BLADE SAW SGTL 11.0X1.19X90.0M (BLADE) ×2 IMPLANT
BNDG ELASTIC 4X5.8 VLCR STR LF (GAUZE/BANDAGES/DRESSINGS) ×2 IMPLANT
BNDG ELASTIC 6X5.8 VLCR STR LF (GAUZE/BANDAGES/DRESSINGS) ×2 IMPLANT
BOWL SMART MIX CTS (DISPOSABLE) ×2 IMPLANT
CEMENT HV SMART SET (Cement) ×2 IMPLANT
COVER SURGICAL LIGHT HANDLE (MISCELLANEOUS) ×2 IMPLANT
CUFF TOURN SGL QUICK 34 (TOURNIQUET CUFF) ×1
CUFF TRNQT CYL 34X4.125X (TOURNIQUET CUFF) ×1 IMPLANT
DERMABOND ADVANCED (GAUZE/BANDAGES/DRESSINGS) ×1
DERMABOND ADVANCED .7 DNX12 (GAUZE/BANDAGES/DRESSINGS) ×1 IMPLANT
DRAPE INCISE IOBAN 66X45 STRL (DRAPES) ×2 IMPLANT
DRAPE U-SHAPE 47X51 STRL (DRAPES) ×2 IMPLANT
DRSG AQUACEL AG ADV 3.5X10 (GAUZE/BANDAGES/DRESSINGS) ×2 IMPLANT
DURAPREP 26ML APPLICATOR (WOUND CARE) ×4 IMPLANT
ELECT REM PT RETURN 15FT ADLT (MISCELLANEOUS) ×2 IMPLANT
GLOVE BIOGEL PI IND STRL 7.5 (GLOVE) ×1 IMPLANT
GLOVE BIOGEL PI IND STRL 8 (GLOVE) ×1 IMPLANT
GLOVE BIOGEL PI INDICATOR 7.5 (GLOVE) ×1
GLOVE BIOGEL PI INDICATOR 8 (GLOVE) ×1
GLOVE ECLIPSE 8.0 STRL XLNG CF (GLOVE) ×2 IMPLANT
GLOVE SURG ORTHO 9.0 STRL STRW (GLOVE) ×2 IMPLANT
GLOVE SURG SS PI 7.0 STRL IVOR (GLOVE) ×2 IMPLANT
GOWN SPEC L4 XLG W/TWL (GOWN DISPOSABLE) ×4 IMPLANT
HANDPIECE INTERPULSE COAX TIP (DISPOSABLE) ×1
KIT TURNOVER KIT A (KITS) ×1 IMPLANT
NS IRRIG 1000ML POUR BTL (IV SOLUTION) ×2 IMPLANT
PACK ICE MAXI GEL EZY WRAP (MISCELLANEOUS) ×1 IMPLANT
PACK TOTAL KNEE CUSTOM (KITS) ×2 IMPLANT
PROTECTOR NERVE ULNAR (MISCELLANEOUS) ×2 IMPLANT
SET HNDPC FAN SPRY TIP SCT (DISPOSABLE) ×1 IMPLANT
SET PAD KNEE POSITIONER (MISCELLANEOUS) ×2 IMPLANT
SOLUTION IRRIG SURGIPHOR (IV SOLUTION) ×2 IMPLANT
SPIKE FLUID TRANSFER (MISCELLANEOUS) ×2 IMPLANT
SPONGE SURGIFOAM ABS GEL 100 (HEMOSTASIS) ×2 IMPLANT
STOCKINETTE 6  STRL (DRAPES) ×1
STOCKINETTE 6 STRL (DRAPES) ×1 IMPLANT
SUT MNCRL AB 3-0 PS2 18 (SUTURE) ×2 IMPLANT
SUT VIC AB 1 CT1 27 (SUTURE) ×3
SUT VIC AB 1 CT1 27XBRD ANTBC (SUTURE) ×3 IMPLANT
SUT VIC AB 2-0 CT1 27 (SUTURE) ×2
SUT VIC AB 2-0 CT1 TAPERPNT 27 (SUTURE) ×2 IMPLANT
SUT VLOC 180 0 24IN GS25 (SUTURE) ×2 IMPLANT
SYR 50ML LL SCALE MARK (SYRINGE) ×2 IMPLANT
TAPE STRIPS DRAPE STRL (GAUZE/BANDAGES/DRESSINGS) ×2 IMPLANT
TIBIAL BASE ROT PLAT SZ 5 KNEE (Knees) ×2 IMPLANT
TRAY CATH INTERMITTENT SS 16FR (CATHETERS) ×2 IMPLANT
WATER STERILE IRR 1000ML POUR (IV SOLUTION) ×4 IMPLANT
WRAP KNEE MAXI GEL POST OP (GAUZE/BANDAGES/DRESSINGS) ×1 IMPLANT

## 2022-07-02 NOTE — Anesthesia Postprocedure Evaluation (Signed)
Anesthesia Post Note  Patient: Kristin Lowe  Procedure(s) Performed: TOTAL KNEE ARTHROPLASTY (Left: Knee)     Patient location during evaluation: PACU Anesthesia Type: Spinal Level of consciousness: oriented and awake and alert Pain management: pain level controlled Vital Signs Assessment: post-procedure vital signs reviewed and stable Respiratory status: spontaneous breathing, respiratory function stable and patient connected to nasal cannula oxygen Cardiovascular status: blood pressure returned to baseline and stable Postop Assessment: no headache, no backache and no apparent nausea or vomiting Anesthetic complications: no   No notable events documented.  Last Vitals:  Vitals:   07/02/22 1145 07/02/22 1200  BP: 132/60 136/80  Pulse: (!) 53 (!) 50  Resp: 18 15  Temp:    SpO2: 95% 95%    Last Pain:  Vitals:   07/02/22 1145  TempSrc:   PainSc: 5                  Kester Stimpson S

## 2022-07-02 NOTE — Anesthesia Procedure Notes (Signed)
Anesthesia Procedure Image    

## 2022-07-02 NOTE — Op Note (Signed)
DATE OF SURGERY:  07/02/2022  TIME: 11:02 AM  PATIENT NAME:  Kristin Lowe    AGE: 81 y.o.   PRE-OPERATIVE DIAGNOSIS:  Left knee osteoarthritis  POST-OPERATIVE DIAGNOSIS:  Left knee osteoarthritis  PROCEDURE:  Procedure(s): TOTAL KNEE ARTHROPLASTY  SURGEON:  Kawana Hegel ANDREW  ASSISTANT:  Leeanne Haus, PA-C, present and scrubbed throughout the case, critical for assistance with exposure, retraction, instrumentation, and closure.  OPERATIVE IMPLANTS: Depuy PFC Attune Rotating Platform.  Femur size 5, Tibia size 5, Patella size 32 3-Kristin oval button, with a 8 mm polyethylene insert.   PREOPERATIVE INDICATIONS:   Kristin Lowe is a 81 y.o. year old female with end stage bone on bone arthritis of the knee who failed conservative treatment and elected for Total Knee Arthroplasty.   The risks, benefits, and alternatives were discussed at length including but not limited to the risks of infection, bleeding, nerve injury, stiffness, blood clots, the need for revision surgery, cardiopulmonary complications, among others, and they were willing to proceed.  OPERATIVE DESCRIPTION:  The patient was brought to the operative room and placed in a supine position.  Spinal anesthesia was administered.  IV antibiotics were given.  The lower extremity was prepped and draped in the usual sterile fashion.  Time out was performed.  The leg was elevated and exsanguinated and the tourniquet was inflated.  Anterior quadriceps tendon splitting approach was performed.  The patella was retracted and osteophytes were removed.  The anterior horn of the medial and lateral meniscus was removed and cruciate ligaments resected.   The distal femur was opened with the drill and the intramedullary distal femoral cutting jig was utilized, set at 5 degrees resecting 10 mm off the distal femur.  Care was taken to protect the collateral ligaments.  The distal femoral sizing jig was applied, taking care to avoid  notching.  Then the 4-in-1 cutting jig was applied and the anterior and posterior femur was cut, along with the chamfer cuts.    Then the extramedullary tibial cutting jig was utilized making the appropriate cut using the anterior tibial crest as a reference building in appropriate posterior slope.  Care was taken during the cut to protect the medial and collateral ligaments.  The proximal tibia was removed along with the posterior horns of the menisci.   The posterior medial femoral osteophytes and posterior lateral femoral osteophytes were removed.    The flexion gap was then measured and was symmetric with the extension gap, measured at 8.  I completed the distal femoral preparation using the appropriate jig to prepare the box.  The patella was then measured, and cut with the saw.    The proximal tibia sized and prepared accordingly with the reamer and the punch, and then all components were trialed with the trial insert.  The knee was found to have excellent balance and full motion.    The above named components were then cemented into place and all excess cement was removed.  The trial polyethylene component was in place during cementation, and then was exchanged for the real polyethylene component.    The knee was easily taken through a range of motion and the patella tracked well and the knee irrigated copiously and the parapatellar and subcutaneous tissue closed with vicryl, and monocryl with steri strips for the skin.  The arthrotomy was closed at 90 of flexion. The wounds were dressed with sterile gauze and the tourniquet released and the patient was awakened and returned to the PACU in  stable and satisfactory condition.  There were no complications.  Total tourniquet time was 80 minutes.

## 2022-07-02 NOTE — Plan of Care (Signed)
  Problem: Education: Goal: Knowledge of General Education information will improve Description: Including pain rating scale, medication(s)/side effects and non-pharmacologic comfort measures Outcome: Progressing   Problem: Activity: Goal: Risk for activity intolerance will decrease Outcome: Progressing   Problem: Coping: Goal: Level of anxiety will decrease Outcome: Progressing   

## 2022-07-02 NOTE — Anesthesia Procedure Notes (Signed)
Anesthesia Regional Block: Adductor canal block   Pre-Anesthetic Checklist: , timeout performed,  Correct Patient, Correct Site, Correct Laterality,  Correct Procedure, Correct Position, site marked,  Risks and benefits discussed,  Surgical consent,  Pre-op evaluation,  At surgeon's request and post-op pain management  Laterality: Left  Prep: chloraprep       Needles:  Injection technique: Single-shot  Needle Type: Echogenic Needle     Needle Length: 9cm      Additional Needles:   Procedures:,,,, ultrasound used (permanent image in chart),,    Narrative:  Start time: 07/02/2022 8:01 AM End time: 07/02/2022 8:09 AM Injection made incrementally with aspirations every 5 mL.  Performed by: Personally  Anesthesiologist: Myrtie Soman, MD  Additional Notes: Patient tolerated the procedure well without complications

## 2022-07-02 NOTE — Anesthesia Procedure Notes (Signed)
Spinal  Start time: 07/02/2022 8:50 AM End time: 07/02/2022 9:00 AM Reason for block: surgical anesthesia Staffing Performed: resident/CRNA  Anesthesiologist: Myrtie Soman, MD Resident/CRNA: Jonna Munro, CRNA Performed by: Jonna Munro, CRNA Authorized by: Myrtie Soman, MD   Preanesthetic Checklist Completed: patient identified, IV checked, site marked, risks and benefits discussed, surgical consent, monitors and equipment checked, pre-op evaluation and timeout performed Spinal Block Patient position: sitting Prep: DuraPrep Patient monitoring: heart rate, cardiac monitor, continuous pulse ox and blood pressure Approach: midline Location: L3-4 Injection technique: single-shot Needle Needle type: Pencan  Needle gauge: 24 G Needle length: 10 cm Assessment Sensory level: T6

## 2022-07-02 NOTE — Interval H&P Note (Signed)
History and Physical Interval Note:  07/02/2022 8:47 AM  Kristin Lowe  has presented today for surgery, with the diagnosis of Left knee osteoarthritis.  The various methods of treatment have been discussed with the patient and family. After consideration of risks, benefits and other options for treatment, the patient has consented to  Procedure(s) with comments: TOTAL KNEE ARTHROPLASTY (Left) - with adductor canal as a surgical intervention.  The patient's history has been reviewed, patient examined, no change in status, stable for surgery.  I have reviewed the patient's chart and labs.  Questions were answered to the patient's satisfaction.     Kristin Lowe ANDREW

## 2022-07-02 NOTE — Transfer of Care (Signed)
Immediate Anesthesia Transfer of Care Note  Patient: Kristin Lowe  Procedure(s) Performed: TOTAL KNEE ARTHROPLASTY (Left: Knee)  Patient Location: PACU  Anesthesia Type:Spinal  Level of Consciousness: awake, alert , oriented and patient cooperative  Airway & Oxygen Therapy: Patient Spontanous Breathing and Patient connected to face mask oxygen  Post-op Assessment: Report given to RN and Post -op Vital signs reviewed and stable  Post vital signs: Reviewed and stable  Last Vitals:  Vitals Value Taken Time  BP    Temp    Pulse    Resp    SpO2      Last Pain:  Vitals:   07/02/22 0714  TempSrc:   PainSc: 3       Patients Stated Pain Goal: 3 (49/44/96 [id number])  Complications: No notable events documented.

## 2022-07-02 NOTE — Care Plan (Signed)
Ortho Bundle Case Management Note  Patient Details  Name: Kristin Lowe MRN: [id number] Date of Birth: February 22, 1941  L TKA on 07-02-22 DCP:  Home with dtr's-in-law mother DME:  RW ordered through [facility] PT:  [facility] on 07-09-22                   DME Arranged:  Gilford Rile rolling DME Agency:  [employer]  HH Arranged:  NA HH Agency:  NA  Additional Comments: Please contact me with any questions of if this plan should need to change.  Marianne Sofia, RN,CCM [facility]  [phone] 07/02/2022, 6:34 AM

## 2022-07-02 NOTE — Evaluation (Signed)
Physical Therapy Evaluation Patient Details Name: Kristin Lowe MRN: [id number] DOB: 06-07-1941 Today's Date: 07/02/2022  History of Present Illness  Pt is a 81yo female presenting s/p L-TKA on 07/02/22. PMH: HTN, PAF, hx of stroke, pre-diabetes, loop recorder.  Clinical Impression  JACKELYNE SAYER is a 81 y.o. female POD 0 s/p L-TKA. During interview, pt expressed strong concern that OPPT is scheduled for Wed 8/16, pt feels this is delayed; PT messaged PA post-session regarding scheduling of OPPT evaluation. Patient reports independence with mobility at baseline. Patient is now limited by functional impairments (see PT problem list below) and requires min assist for bed mobility, provided gait belt and instructed pt how to self-assist for bed mobility. Pt had not cleared anesthesia enough to perform L straight leg raise so further mobility deferred. Patient instructed in exercise to facilitate ROM and circulation to manage edema. Provided incentive spirometer and with Vcs pt able to achieve 1533m. Pt found to be incontinent of urine during bed mobility, RN and NT arrived to change linens and clean pt, session ended at this time. Patient will benefit from continued skilled PT interventions to address impairments and progress towards PLOF. Acute PT will follow to progress mobility and stair training in preparation for safe discharge home.       Recommendations for follow up therapy are one component of a multi-disciplinary discharge planning process, led by the attending physician.  Recommendations may be updated based on patient status, additional functional criteria and insurance authorization.  Follow Up Recommendations Follow physician's recommendations for discharge plan and follow up therapies      Assistance Recommended at Discharge Intermittent Supervision/Assistance  Patient can return home with the following  A little help with walking and/or transfers;A little help with  bathing/dressing/bathroom;Assistance with cooking/housework;Assist for transportation;Help with stairs or ramp for entrance    Equipment Recommendations Rolling walker (2 wheels)  Recommendations for Other Services       Functional Status Assessment Patient has had a recent decline in their functional status and demonstrates the ability to make significant improvements in function in a reasonable and predictable amount of time.     Precautions / Restrictions Precautions Precautions: Fall Restrictions Weight Bearing Restrictions: No      Mobility  Bed Mobility Overal bed mobility: Needs Assistance Bed Mobility: Supine to Sit, Sit to Supine     Supine to sit: Min assist Sit to supine: Min assist   General bed mobility comments: Min assist to bring LLE off and on bed, taught pt to use gait belt as well. Pt dangled EOB for ~570m without any symptoms, returned to supine positionining. During bed mobility, discovered pt was incontinent of urine, RN and NT notified and assisted with linene change. Further mobility deferred as pt had not yet cleared anesthesia sufficiently for safe transfers.    Transfers                   General transfer comment: deferred    Ambulation/Gait               General Gait Details: deferred  Stairs            Wheelchair Mobility    Modified Rankin (Stroke Patients Only)       Balance Overall balance assessment: Mild deficits observed, not formally tested  Pertinent Vitals/Pain Pain Assessment Pain Assessment: Faces Faces Pain Scale: Hurts whole lot Pain Location: LEFT knee Pain Descriptors / Indicators: Operative site guarding, Discomfort, Grimacing Pain Intervention(s): Limited activity within patient's tolerance, Monitored during session, Repositioned    Home Living Family/patient expects to be discharged to:: Private residence Living Arrangements:  Alone Available Help at Discharge: Family;Available 24 hours/day;Neighbor (Daughter-in-law mom named Deneise Lever) Type of Home: House Home Access: Level entry     Alternate Level Stairs-Number of Steps: 14 Home Layout: Two level Home Equipment: Toilet riser;Cane - single point Additional Comments: Son will take home    Prior Function Prior Level of Function : Independent/Modified Independent             Mobility Comments: IND ADLs Comments: IND     Hand Dominance   Dominant Hand: Right    Extremity/Trunk Assessment   Upper Extremity Assessment Upper Extremity Assessment: Overall WFL for tasks assessed    Lower Extremity Assessment Lower Extremity Assessment: RLE deficits/detail;LLE deficits/detail RLE Deficits / Details: MMT ank DF/PF 4/5 RLE Sensation: WNL LLE Deficits / Details: MMT ank DF/PF 4/5, pt unable to perform SLR LLE Sensation: decreased light touch    Cervical / Trunk Assessment Cervical / Trunk Assessment: Kyphotic  Communication   Communication: No difficulties  Cognition Arousal/Alertness: Awake/alert Behavior During Therapy: WFL for tasks assessed/performed Overall Cognitive Status: Within Functional Limits for tasks assessed                                          General Comments      Exercises Total Joint Exercises Ankle Circles/Pumps: AROM, Both, 10 reps   Assessment/Plan    PT Assessment Patient needs continued PT services  PT Problem List Decreased strength;Decreased range of motion;Decreased activity tolerance;Decreased balance;Decreased mobility;Decreased coordination;Pain       PT Treatment Interventions DME instruction;Gait training;Stair training;Functional mobility training;Therapeutic activities;Therapeutic exercise;Balance training;Neuromuscular re-education;Patient/family education    PT Goals (Current goals can be found in the Care Plan section)  Acute Rehab PT Goals Patient Stated Goal: To return to  hiking PT Goal Formulation: With patient Time For Goal Achievement: 07/09/22    Frequency 7X/week     Co-evaluation               AM-PAC PT "6 Clicks" Mobility  Outcome Measure Help needed turning from your back to your side while in a flat bed without using bedrails?: A Little Help needed moving from lying on your back to sitting on the side of a flat bed without using bedrails?: A Little Help needed moving to and from a bed to a chair (including a wheelchair)?: A Little Help needed standing up from a chair using your arms (e.g., wheelchair or bedside chair)?: A Little Help needed to walk in hospital room?: A Little Help needed climbing 3-5 steps with a railing? : A Lot 6 Click Score: 17    End of Session Equipment Utilized During Treatment: Gait belt Activity Tolerance: Patient tolerated treatment well;No increased pain Patient left: in bed;with call bell/phone within reach;with nursing/sitter in room Nurse Communication: Mobility status PT Visit Diagnosis: Pain;Difficulty in walking, not elsewhere classified (R26.2) Pain - Right/Left: Left Pain - part of body: Knee    Time: 1610-9604 PT Time Calculation (min) (ACUTE ONLY): 29 min   Charges:   PT Evaluation $PT Eval Low Complexity: 1 Low PT Treatments $Therapeutic Activity: 8-22 mins  Coolidge Breeze, PT, DPT [facility] Office: [phone] Pager: [phone]  Coolidge Breeze 07/02/2022, 3:58 PM

## 2022-07-03 ENCOUNTER — Encounter (HOSPITAL_COMMUNITY): Payer: Self-pay | Admitting: Specialist

## 2022-07-03 DIAGNOSIS — Z85828 Personal history of other malignant neoplasm of skin: Secondary | ICD-10-CM | POA: Diagnosis not present

## 2022-07-03 DIAGNOSIS — I480 Paroxysmal atrial fibrillation: Secondary | ICD-10-CM | POA: Diagnosis not present

## 2022-07-03 DIAGNOSIS — Z87891 Personal history of nicotine dependence: Secondary | ICD-10-CM | POA: Diagnosis not present

## 2022-07-03 DIAGNOSIS — Z8673 Personal history of transient ischemic attack (TIA), and cerebral infarction without residual deficits: Secondary | ICD-10-CM | POA: Diagnosis not present

## 2022-07-03 DIAGNOSIS — I10 Essential (primary) hypertension: Secondary | ICD-10-CM | POA: Diagnosis not present

## 2022-07-03 DIAGNOSIS — M1712 Unilateral primary osteoarthritis, left knee: Secondary | ICD-10-CM | POA: Diagnosis not present

## 2022-07-03 MED ORDER — ONDANSETRON HCL 4 MG PO TABS: 4.0000 mg | ORAL_TABLET | Freq: Four times a day (QID) | ORAL | 0 refills | Status: DC | PRN

## 2022-07-03 MED ORDER — OXYCODONE HCL 5 MG PO TABS: 5.0000 mg | ORAL_TABLET | ORAL | 0 refills | Status: DC | PRN

## 2022-07-03 MED ORDER — TRAMADOL HCL 50 MG PO TABS: 50.0000 mg | ORAL_TABLET | Freq: Four times a day (QID) | ORAL | 0 refills | Status: DC | PRN

## 2022-07-03 MED ORDER — BISACODYL 5 MG PO TBEC: 5.0000 mg | DELAYED_RELEASE_TABLET | Freq: Every day | ORAL | 0 refills | Status: DC | PRN

## 2022-07-03 MED ORDER — METHOCARBAMOL 500 MG PO TABS: 500.0000 mg | ORAL_TABLET | Freq: Four times a day (QID) | ORAL | 0 refills | Status: DC | PRN

## 2022-07-03 NOTE — Plan of Care (Signed)
  Problem: Education: Goal: Knowledge of General Education information will improve Description: Including pain rating scale, medication(s)/side effects and non-pharmacologic comfort measures Outcome: Progressing   Problem: Clinical Measurements: Goal: Cardiovascular complication will be avoided Outcome: Not Applicable   Problem: Activity: Goal: Risk for activity intolerance will decrease Outcome: Progressing   Problem: Nutrition: Goal: Adequate nutrition will be maintained Outcome: Progressing   Problem: Pain Managment: Goal: General experience of comfort will improve Outcome: Progressing

## 2022-07-03 NOTE — Discharge Summary (Addendum)
Physician Discharge Summary  Patient ID: Kristin Lowe MRN: [id number] DOB/AGE: October 15, 1941 81 y.o.  Admit date: 07/02/2022 Discharge date: 07/07/2022  Admission Diagnoses: Left knee osteoarthritis  Discharge Diagnoses:  Principal Problem:   Osteoarthritis of left knee   Discharged Condition: good  Hospital Course: Patient was admitted on August 9 for a left total knee arthroplasty due to left knee end-stage osteoarthritis.  Patient tolerated surgery very well.  She was sent to PACU in postop floor in stable condition.  She worked with physical therapy, was unable to get up and ambulate due to difficulty with breathing.  States that got much better overnight.  Postop day 1 pain is well-controlled with p.o. pain medication.  Patient was able to get up with physical therapy but was limited due to nausea and fatigue.  Postop day 2 pain is well controlled but mobility still limited due to fatigue.  Very limited ambulation was able to be done.  No events overnight.  Postop day 3 patient doing better was able to get up with therapy but was having some increased pain.  Postop day 4 pain is getting better controlled.  Was able to get up with some therapy.  Postop day 5 patient doing much better.  Pain well controlled was able to get up with therapy.  Was able to be discharged home today.  She is going to be doing outpatient physical therapy.  Resume all home medication.  Resume Eliquis for DVT prevention.  Consults: None  Significant Diagnostic Studies: None  Treatments: IV hydration, antibiotics: Ancef, analgesia: acetaminophen and oxycodone, therapies: PT, and surgery: Left TKA  Discharge Exam: Blood pressure (!) 132/52, pulse 79, temperature 98.9 F (37.2 C), temperature source Oral, resp. rate 17, height '5\' 6"'$  (1.676 m), weight 75.3 kg, SpO2 93 %. General appearance: alert, cooperative, appears stated age, and no distress Extremities: Limited ROM of left Lower extremity at knee. Able to  dorsiflex/ plantar flex of left ankle. extremities normal, atraumatic, no cyanosis or edema and Homans sign is negative, no sign of DVT Pulses: 2+ and symmetric Skin: Skin color, texture, turgor normal. No rashes or lesions Neurologic: Grossly normal Incision/Wound: Dressings C/D/I  Disposition: Discharge disposition: 01-Home or Self Care       Discharge Instructions     Call MD / Call 911   Complete by: As directed    If you experience chest pain or shortness of breath, CALL 911 and be transported to the hospital emergency room.  If you develope a fever above 101 F, pus (white drainage) or increased drainage or redness at the wound, or calf pain, call your surgeon's office.   Call MD / Call 911   Complete by: As directed    If you experience chest pain or shortness of breath, CALL 911 and be transported to the hospital emergency room.  If you develope a fever above 101 F, pus (white drainage) or increased drainage or redness at the wound, or calf pain, call your surgeon's office.   Constipation Prevention   Complete by: As directed    Drink plenty of fluids.  Prune juice may be helpful.  You may use a stool softener, such as Colace (over the counter) 100 mg twice a day.  Use MiraLax (over the counter) for constipation as needed.   Constipation Prevention   Complete by: As directed    Drink plenty of fluids.  Prune juice may be helpful.  You may use a stool softener, such as Colace (over the counter)  100 mg twice a day.  Use MiraLax (over the counter) for constipation as needed.   Diet - low sodium heart healthy   Complete by: As directed    Diet - low sodium heart healthy   Complete by: As directed    Discharge instructions   Complete by: As directed    Dr. Sydnee Cabal [facility] [address] [place], [place] [address] [phone]  TOTAL KNEE REPLACEMENT POSTOPERATIVE DIRECTIONS  Knee Rehabilitation, Guidelines Following Surgery  Results after knee  surgery are often greatly improved when you follow the exercise, range of motion and muscle strengthening exercises prescribed by your doctor. Safety measures are also important to protect the knee from further injury. Any time any of these exercises cause you to have increased pain or swelling in your knee joint, decrease the amount until you are comfortable again and slowly increase them. If you have problems or questions, call your caregiver or physical therapist for advice.   HOME CARE INSTRUCTIONS  Remove items at home which could result in a fall. This includes throw rugs or furniture in walking pathways.  ICE to the affected knee every three hours for 30 minutes at a time and then as needed for pain and swelling.  Continue to use ice on the knee for pain and swelling from surgery. You may notice swelling that will progress down to the foot and ankle.  This is normal after surgery.  Elevate the leg when you are not up walking on it.   Continue to use the breathing machine which will help keep your temperature down.  It is common for your temperature to cycle up and down following surgery, especially at night when you are not up moving around and exerting yourself.  The breathing machine keeps your lungs expanded and your temperature down. Do not place pillow under knee, focus on keeping the knee straight while resting   DIET You may resume your previous home diet once your are discharged from the hospital.  DRESSING / WOUND CARE / SHOWERING Keep the surgical dressing until follow up.  The dressing is water proof, but you need to put extra covering over it like plastic wrap.  IF THE DRESSING FALLS OFF or the wound gets wet inside, change the dressing with sterile gauze.  Please use good hand washing techniques before changing the dressing.  Do not use any lotions or creams on the incision until instructed by your surgeon.   You may start showering once you are discharged home but do not submerge  the incision under water.  You are sent home with an ACE bandage on over the leg, this can be removed at 3 days from surgery. At this time you can start showering. Please place the white TED stocking on the surgical leg after. This needs to be worn on the surgical leg for 2 weeks after surgery.   ACTIVITY Walk with your walker as instructed. Use walker as long as suggested by your caregivers. Avoid periods of inactivity such as sitting longer than an hour when not asleep. This helps prevent blood clots.  You may resume a sexual relationship in one month or when given the OK by your doctor.  You may return to work once you are cleared by your doctor.  Do not drive a car for 6 weeks or until released by you surgeon.  Do not drive while taking narcotics.  WEIGHT BEARING Weight bearing as tolerated with assist device (walker, cane, etc) as  directed, use it as long as suggested by your surgeon or therapist, typically at least 4-6 weeks.  POSTOPERATIVE CONSTIPATION PROTOCOL Constipation - defined medically as fewer than three stools per week and severe constipation as less than one stool per week.  One of the most common issues patients have following surgery is constipation.  Even if you have a regular bowel pattern at home, your normal regimen is likely to be disrupted due to multiple reasons following surgery.  Combination of anesthesia, postoperative narcotics, change in appetite and fluid intake all can affect your bowels.  In order to avoid complications following surgery, here are some recommendations in order to help you during your recovery period.  Colace (docusate) - Pick up an over-the-counter form of Colace or another stool softener and take twice a day as long as you are requiring postoperative pain medications.  Take with a full glass of water daily.  If you experience loose stools or diarrhea, hold the colace until you stool forms back up.  If your symptoms do not get better within 1  week or if they get worse, check with your doctor.  Dulcolax (bisacodyl) - Pick up over-the-counter and take as directed by the product packaging as needed to assist with the movement of your bowels.  Take with a full glass of water.  Use this product as needed if not relieved by Colace only.   MiraLax (polyethylene glycol) - Pick up over-the-counter to have on hand.  MiraLax is a solution that will increase the amount of water in your bowels to assist with bowel movements.  Take as directed and can mix with a glass of water, juice, soda, coffee, or tea.  Take if you go more than two days without a movement. Do not use MiraLax more than once per day. Call your doctor if you are still constipated or irregular after using this medication for 7 days in a row.  If you continue to have problems with postoperative constipation, please contact the office for further assistance and recommendations.  If you experience "the worst abdominal pain ever" or develop nausea or vomiting, please contact the office immediatly for further recommendations for treatment.  ITCHING  If you experience itching with your medications, try taking only a single pain pill, or even half a pain pill at a time.  You can also use Benadryl over the counter for itching or also to help with sleep.   TED HOSE STOCKINGS Wear the elastic stockings on both legs for two weeks following surgery during the day but you may remove then at night for sleeping.  Okay to remove ACE in 3 days, put TED on after  MEDICATIONS See your medication summary on the "After Visit Summary" that the nursing staff will review with you prior to discharge.  You may have some home medications which will be placed on hold until you complete the course of blood thinner medication.  It is important for you to complete the blood thinner medication as prescribed by your surgeon.  Continue your approved medications as instructed at time of discharge. If you were not  previously taking any blood thinners prior to surgery please start taking Aspirin 325 mg tabs twice daily for 6 weeks. If you are unable to take Aspirin please let your doctor know.   PRECAUTIONS If you experience chest pain or shortness of breath - call 911 immediately for transfer to the hospital emergency department.  If you develop a fever greater that 101 F, purulent  drainage from wound, increased redness or drainage from wound, foul odor from the wound/dressing, or calf pain - CONTACT YOUR SURGEON.                                                   FOLLOW-UP APPOINTMENTS Make sure you keep all of your appointments after your operation with your surgeon and caregivers. You should call the office at the above phone number and make an appointment for approximately two weeks after the date of your surgery or on the date instructed by your surgeon outlined in the "After Visit Summary".   RANGE OF MOTION AND STRENGTHENING EXERCISES  Rehabilitation of the knee is important following a knee injury or an operation. After just a few days of immobilization, the muscles of the thigh which control the knee become weakened and shrink (atrophy). Knee exercises are designed to build up the tone and strength of the thigh muscles and to improve knee motion. Often times heat used for twenty to thirty minutes before working out will loosen up your tissues and help with improving the range of motion but do not use heat for the first two weeks following surgery. These exercises can be done on a training (exercise) mat, on the floor, on a table or on a bed. Use what ever works the best and is most comfortable for you Knee exercises include:  Leg Lifts - While your knee is still immobilized in a splint or cast, you can do straight leg raises. Lift the leg to 60 degrees, hold for 3 sec, and slowly lower the leg. Repeat 10-20 times 2-3 times daily. Perform this exercise against resistance later as your knee gets better.   Quad and Hamstring Sets - Tighten up the muscle on the front of the thigh (Quad) and hold for 5-10 sec. Repeat this 10-20 times hourly. Hamstring sets are done by pushing the foot backward against an object and holding for 5-10 sec. Repeat as with quad sets.  Leg Slides: Lying on your back, slowly slide your foot toward your buttocks, bending your knee up off the floor (only go as far as is comfortable). Then slowly slide your foot back down until your leg is flat on the floor again. Angel Wings: Lying on your back spread your legs to the side as far apart as you can without causing discomfort.  A rehabilitation program following serious knee injuries can speed recovery and prevent re-injury in the future due to weakened muscles. Contact your doctor or a physical therapist for more information on knee rehabilitation.   IF YOU ARE TRANSFERRED TO A SKILLED REHAB FACILITY If the patient is transferred to a skilled rehab facility following release from the hospital, a list of the current medications will be sent to the facility for the patient to continue.  When discharged from the skilled rehab facility, please have the facility set up the patient's [place] prior to being released. Also, the skilled facility will be responsible for providing the patient with their medications at time of release from the facility to include their pain medication, the muscle relaxants, and their blood thinner medication. If the patient is still at the rehab facility at time of the two week follow up appointment, the skilled rehab facility will also need to assist the patient in arranging follow up appointment in  our office and any transportation needs.  MAKE SURE YOU:  Understand these instructions.  Get help right away if you are not doing well or get worse.    Pick up stool softner and laxative for home use following surgery while on pain medications. May shower starting three days after  surgery. Please use a clean towel to pat the leg dry following showers. Continue to use ice for pain and swelling after surgery. Do not use any lotions or creams on the incision until instructed by you Start Aspirin immediately following surgery.   Do not put a pillow under the knee. Place it under the heel.   Complete by: As directed    Driving restrictions   Complete by: As directed    No driving for two weeks   Increase activity slowly as tolerated   Complete by: As directed    Post-operative opioid taper instructions:   Complete by: As directed    POST-OPERATIVE OPIOID TAPER INSTRUCTIONS: It is important to wean off of your opioid medication as soon as possible. If you do not need pain medication after your surgery it is ok to stop day one. Opioids include: Codeine, Hydrocodone(Norco, Vicodin), Oxycodone(Percocet, oxycontin) and hydromorphone amongst others.  Long term and even short term use of opiods can cause: Increased pain response Dependence Constipation Depression Respiratory depression And more.  Withdrawal symptoms can include Flu like symptoms Nausea, vomiting And more Techniques to manage these symptoms Hydrate well Eat regular healthy meals Stay active Use relaxation techniques(deep breathing, meditating, yoga) Do Not substitute Alcohol to help with tapering If you have been on opioids for less than two weeks and do not have pain than it is ok to stop all together.  Plan to wean off of opioids This plan should start within one week post op of your joint replacement. Maintain the same interval or time between taking each dose and first decrease the dose.  Cut the total daily intake of opioids by one tablet each day Next start to increase the time between doses. The last dose that should be eliminated is the evening dose.      Post-operative opioid taper instructions:   Complete by: As directed    POST-OPERATIVE OPIOID TAPER INSTRUCTIONS: It is important  to wean off of your opioid medication as soon as possible. If you do not need pain medication after your surgery it is ok to stop day one. Opioids include: Codeine, Hydrocodone(Norco, Vicodin), Oxycodone(Percocet, oxycontin) and hydromorphone amongst others.  Long term and even short term use of opiods can cause: Increased pain response Dependence Constipation Depression Respiratory depression And more.  Withdrawal symptoms can include Flu like symptoms Nausea, vomiting And more Techniques to manage these symptoms Hydrate well Eat regular healthy meals Stay active Use relaxation techniques(deep breathing, meditating, yoga) Do Not substitute Alcohol to help with tapering If you have been on opioids for less than two weeks and do not have pain than it is ok to stop all together.  Plan to wean off of opioids This plan should start within one week post op of your joint replacement. Maintain the same interval or time between taking each dose and first decrease the dose.  Cut the total daily intake of opioids by one tablet each day Next start to increase the time between doses. The last dose that should be eliminated is the evening dose.      TED hose   Complete by: As directed    Use stockings (TED hose) for  three weeks on both leg(s).  You may remove them at night for sleeping.   Weight bearing as tolerated   Complete by: As directed       Allergies as of 07/05/2022   No Known Allergies      Medication List     TAKE these medications    apixaban 5 MG Tabs tablet Commonly known as: Eliquis Take 1 tablet (5 mg total) by mouth 2 (two) times daily.   atorvastatin 40 MG tablet Commonly known as: LIPITOR Take 1 tablet (40 mg total) by mouth daily at 6 PM.   bisacodyl 5 MG EC tablet Commonly known as: DULCOLAX Take 1 tablet (5 mg total) by mouth daily as needed for moderate constipation.   cholecalciferol 25 MCG (1000 UNIT) tablet Commonly known as: VITAMIN D3 Take  1,000 Units by mouth daily.   clobetasol ointment 0.05 % Commonly known as: TEMOVATE Apply 1 application topically daily as needed (skin irritation).   CoQ-10 100 MG Caps Take 100 mg by mouth daily.   Coral Calcium 500 MG Caps Take 500 mg by mouth daily.   Magnesium 250 MG Tabs Take 250 mg by mouth daily.   methocarbamol 500 MG tablet Commonly known as: ROBAXIN Take 1 tablet (500 mg total) by mouth every 6 (six) hours as needed for muscle spasms.   Nasacort Allergy 24HR 55 MCG/ACT Aero nasal inhaler Generic drug: triamcinolone Place 2 sprays into the nose daily as needed (allergies).   ondansetron 4 MG tablet Commonly known as: ZOFRAN Take 1 tablet (4 mg total) by mouth every 6 (six) hours as needed for nausea.   oxyCODONE 5 MG immediate release tablet Commonly known as: Oxy IR/ROXICODONE Take 1-2 tablets (5-10 mg total) by mouth every 4 (four) hours as needed for moderate pain or severe pain (pain score 4-6).   traMADol 50 MG tablet Commonly known as: ULTRAM Take 1 tablet (50 mg total) by mouth every 6 (six) hours as needed for moderate pain.   TURMERIC PO Take 1,000 mg by mouth daily.   vitamin C 1000 MG tablet Take 1,000 mg by mouth 4 (four) times a week.               Discharge Care Instructions  (From admission, onward)           Start     Ordered   07/03/22 0000  Weight bearing as tolerated        07/03/22 0622            Follow-up Information     Drue Novel, PA. Go on 07/16/2022.   Specialty: Orthopedic Surgery Why: You are scheduled for a follow up appointment on 07-16-22 at 2:30 pm. Contact information: [address] [place] [address] [phone]                 Signed: Lester Kinsman [facility] [phone] 07/05/2022, 8:36 AM

## 2022-07-03 NOTE — Progress Notes (Signed)
Physical Therapy Treatment Patient Details Name: Kristin Lowe MRN: [id number] DOB: 1941-03-16 Today's Date: 07/03/2022   History of Present Illness Pt is a 81yo female presenting s/p L-TKA on 07/02/22. PMH: HTN, PAF, hx of stroke, pre-diabetes, loop recorder.    PT Comments    Pt continues very cooperative but progressing slowly with mobility 2* c/o fatigue, nausea, pain and dizziness - BP 139/78 - RN aware.   Recommendations for follow up therapy are one component of a multi-disciplinary discharge planning process, led by the attending physician.  Recommendations may be updated based on patient status, additional functional criteria and insurance authorization.  Follow Up Recommendations  Follow physician's recommendations for discharge plan and follow up therapies     Assistance Recommended at Discharge Intermittent Supervision/Assistance  Patient can return home with the following A little help with bathing/dressing/bathroom;Assistance with cooking/housework;Assist for transportation;Help with stairs or ramp for entrance;A lot of help with walking and/or transfers   Equipment Recommendations  Rolling walker (2 wheels)    Recommendations for Other Services       Precautions / Restrictions Precautions Precautions: Fall Restrictions Weight Bearing Restrictions: No Other Position/Activity Restrictions: WBAT     Mobility  Bed Mobility Overal bed mobility: Needs Assistance Bed Mobility: Sit to Supine     Supine to sit: Min assist Sit to supine: Min assist, Mod assist   General bed mobility comments: INcreased time with cues for sequence and assist to manage bil LE    Transfers Overall transfer level: Needs assistance Equipment used: Rolling walker (2 wheels) Transfers: Sit to/from Stand Sit to Stand: Min assist, +2 physical assistance, +2 safety/equipment, From elevated surface           General transfer comment: cues for sequence and use of R LE to self  assist    Ambulation/Gait Ambulation/Gait assistance: Min assist, +2 safety/equipment Gait Distance (Feet): 29 Feet Assistive device: Rolling walker (2 wheels) Gait Pattern/deviations: Step-to pattern, Decreased step length - right, Decreased step length - left, Shuffle, Trunk flexed Gait velocity: decr     General Gait Details: Cues for sequence, posture and position from RW.  Distance ltd by pain, nausea, fatigue, and c/o dizziness - BP 139/78   Stairs             Wheelchair Mobility    Modified Rankin (Stroke Patients Only)       Balance Overall balance assessment: Mild deficits observed, not formally tested                                          Cognition Arousal/Alertness: Awake/alert Behavior During Therapy: WFL for tasks assessed/performed Overall Cognitive Status: Within Functional Limits for tasks assessed                                          Exercises Total Joint Exercises Ankle Circles/Pumps: AROM, Both, 15 reps, Supine Quad Sets: AROM, Both, 10 reps, Supine Heel Slides: AAROM, Left, 10 reps, Supine Straight Leg Raises: AAROM, Left, 10 reps, Supine    General Comments        Pertinent Vitals/Pain Pain Assessment Pain Assessment: Faces Faces Pain Scale: Hurts whole lot Pain Location: LEFT knee Pain Descriptors / Indicators: Operative site guarding, Discomfort, Grimacing Pain Intervention(s): Limited activity within patient's tolerance, Monitored during session,  Premedicated before session, Ice applied    Home Living                          Prior Function            PT Goals (current goals can now be found in the care plan section) Acute Rehab PT Goals Patient Stated Goal: To return to hiking PT Goal Formulation: With patient Time For Goal Achievement: 07/09/22 Progress towards PT goals: Progressing toward goals    Frequency    7X/week      PT Plan Current plan remains  appropriate    Co-evaluation              AM-PAC PT "6 Clicks" Mobility   Outcome Measure  Help needed turning from your back to your side while in a flat bed without using bedrails?: A Little Help needed moving from lying on your back to sitting on the side of a flat bed without using bedrails?: A Little Help needed moving to and from a bed to a chair (including a wheelchair)?: A Little Help needed standing up from a chair using your arms (e.g., wheelchair or bedside chair)?: A Little Help needed to walk in hospital room?: A Lot Help needed climbing 3-5 steps with a railing? : Total 6 Click Score: 15    End of Session Equipment Utilized During Treatment: Gait belt Activity Tolerance: Patient limited by pain Patient left: in bed;with call bell/phone within reach;with bed alarm set;with family/visitor present Nurse Communication: Mobility status PT Visit Diagnosis: Pain;Difficulty in walking, not elsewhere classified (R26.2) Pain - Right/Left: Left Pain - part of body: Knee     Time: 1438-1510 PT Time Calculation (min) (ACUTE ONLY): 32 min  Charges:  $Gait Training: 23-37 mins $Therapeutic Exercise: 8-22 mins $Therapeutic Activity: 8-22 mins                     Debe Coder PT Acute Rehabilitation Services Pager [phone] Office [phone]    Kristin Lowe 07/03/2022, 5:40 PM

## 2022-07-03 NOTE — Progress Notes (Signed)
Subjective: 1 Day Post-Op Procedure(s) (LRB): TOTAL KNEE ARTHROPLASTY (Left) Patient reports pain as mild.  It is being well-controlled on p.o. pain medication Patient unfortunately was unable to get up yesterday with physical therapy due to difficulty with breathing. She states she is feeling much better this morning She has been able to void on her own, minimal flatus Overall no events overnight  Objective: Vital signs in last 24 hours: Temp:  [97.2 F (36.2 C)-98 F (36.7 C)] 97.9 F (36.6 C) (08/10 0151) Pulse Rate:  [50-72] 65 (08/10 0151) Resp:  [13-24] 17 (08/10 0151) BP: (97-157)/(55-98) 155/77 (08/10 0151) SpO2:  [92 %-100 %] 99 % (08/10 0151) Weight:  [75.3 kg] 75.3 kg (08/09 0714)  Intake/Output from previous day: 08/09 0701 - 08/10 0700 In: 1570.8 [P.O.:420; I.V.:900.8; IV Piggyback:250] Out: 5681 [Urine:1450; Blood:20] Intake/Output this shift: Total I/O In: -  Out: 350 [Urine:350]  Recent Labs    07/02/22 1333  HGB 12.6   Recent Labs    07/02/22 1333  WBC 6.4  RBC 4.28  HCT 40.4  PLT 233   Recent Labs    07/02/22 1333  CREATININE 0.79   No results for input(s): "LABPT", "INR" in the last 72 hours.  Neurologically intact Neurovascular intact Sensation intact distally Intact pulses distally Dorsiflexion/Plantar flexion intact Incision: dressing C/D/I Compartment soft   Assessment/Plan: 1 Day Post-Op Procedure(s) (LRB): TOTAL KNEE ARTHROPLASTY (Left) Advance diet Up with therapy Once patient clears therapy today plan for discharge home Patient has outpatient physical therapy scheduled for next week, I will work on getting it possibly pushed up earlier in the week Medications have been sent to the patient's pharmacy She will resume her Eliquis for DVT prevention She will follow-up in the office in 2 weeks for first postop appointment    Patient's anticipated LOS is less than 2 midnights, meeting these requirements: - Younger than  51 - Lives within 1 hour of care - Has a competent adult at home to recover with post-op recover - NO history of  - Chronic pain requiring opiods  - Diabetes  - Coronary Artery Disease  - Heart failure  - Heart attack  - Stroke  - DVT/VTE  - Cardiac arrhythmia  - Respiratory Failure/COPD  - Renal failure  - Anemia  - Advanced Liver disease     Derrick Ravel [phone] 07/03/2022, 6:17 AM

## 2022-07-03 NOTE — Progress Notes (Signed)
  Transition of Care [facility]) Screening Note   Patient Details  Name: Kristin Lowe Date of Birth: 1941-04-18   Transition of Care [facility]) CM/SW Contact:    Lennart Pall, LCSW Phone Number: 07/03/2022, 9:48 AM    Transition of Care Department [facility]) has reviewed patient and no TOC needs have been identified at this time. We will continue to monitor patient advancement through interdisciplinary progression rounds. If new patient transition needs arise, please place a TOC consult.

## 2022-07-03 NOTE — Progress Notes (Signed)
Physical Therapy Treatment Patient Details Name: Kristin Lowe MRN: [id number] DOB: 1941-10-17 Today's Date: 07/03/2022   History of Present Illness Pt is a 81yo female presenting s/p L-TKA on 07/02/22. PMH: HTN, PAF, hx of stroke, pre-diabetes, loop recorder.    PT Comments    Pt very cooperative but progress limited this date by fatigue, nausea, pain and c/o dizziness - RN aware.   Recommendations for follow up therapy are one component of a multi-disciplinary discharge planning process, led by the attending physician.  Recommendations may be updated based on patient status, additional functional criteria and insurance authorization.  Follow Up Recommendations  Follow physician's recommendations for discharge plan and follow up therapies     Assistance Recommended at Discharge Intermittent Supervision/Assistance  Patient can return home with the following A little help with bathing/dressing/bathroom;Assistance with cooking/housework;Assist for transportation;Help with stairs or ramp for entrance;A lot of help with walking and/or transfers   Equipment Recommendations  Rolling walker (2 wheels)    Recommendations for Other Services       Precautions / Restrictions Precautions Precautions: Fall Restrictions Weight Bearing Restrictions: No     Mobility  Bed Mobility Overal bed mobility: Needs Assistance Bed Mobility: Supine to Sit     Supine to sit: Min assist     General bed mobility comments: INcreased time with cues for sequence and use of R LE to self assist    Transfers Overall transfer level: Needs assistance Equipment used: Rolling walker (2 wheels) Transfers: Sit to/from Stand Sit to Stand: Min assist, +2 physical assistance, +2 safety/equipment, From elevated surface           General transfer comment: cues for sequence and use of R LE to self assist    Ambulation/Gait Ambulation/Gait assistance: Min assist, +2 physical assistance, +2  safety/equipment Gait Distance (Feet): 14 Feet Assistive device: Rolling walker (2 wheels) Gait Pattern/deviations: Step-to pattern, Decreased step length - right, Decreased step length - left, Shuffle, Trunk flexed Gait velocity: decr     General Gait Details: Cues for sequence, posture and position from RW.  Distance ltd by pain, nausea, fatigue, and c/o dizziness - BP 150/86   Stairs             Wheelchair Mobility    Modified Rankin (Stroke Patients Only)       Balance Overall balance assessment: Mild deficits observed, not formally tested                                          Cognition Arousal/Alertness: Awake/alert Behavior During Therapy: WFL for tasks assessed/performed Overall Cognitive Status: Within Functional Limits for tasks assessed                                          Exercises Total Joint Exercises Ankle Circles/Pumps: AROM, Both, 15 reps, Supine Quad Sets: AROM, Both, 10 reps, Supine Heel Slides: AAROM, Left, 10 reps, Supine Straight Leg Raises: AAROM, Left, 10 reps, Supine    General Comments        Pertinent Vitals/Pain Pain Assessment Pain Assessment: Faces Faces Pain Scale: Hurts whole lot Pain Location: LEFT knee Pain Descriptors / Indicators: Operative site guarding, Discomfort, Grimacing Pain Intervention(s): Limited activity within patient's tolerance, Monitored during session, Premedicated before session, Ice applied  Home Living                          Prior Function            PT Goals (current goals can now be found in the care plan section) Acute Rehab PT Goals Patient Stated Goal: To return to hiking PT Goal Formulation: With patient Time For Goal Achievement: 07/09/22 Progress towards PT goals: Progressing toward goals    Frequency    7X/week      PT Plan Current plan remains appropriate    Co-evaluation              AM-PAC PT "6 Clicks"  Mobility   Outcome Measure  Help needed turning from your back to your side while in a flat bed without using bedrails?: A Little Help needed moving from lying on your back to sitting on the side of a flat bed without using bedrails?: A Little Help needed moving to and from a bed to a chair (including a wheelchair)?: A Little Help needed standing up from a chair using your arms (e.g., wheelchair or bedside chair)?: A Little Help needed to walk in hospital room?: Total Help needed climbing 3-5 steps with a railing? : Total 6 Click Score: 14    End of Session Equipment Utilized During Treatment: Gait belt Activity Tolerance: Patient limited by pain Patient left: in chair;with call bell/phone within reach;with family/visitor present Nurse Communication: Mobility status PT Visit Diagnosis: Pain;Difficulty in walking, not elsewhere classified (R26.2) Pain - Right/Left: Left Pain - part of body: Knee     Time: 1478-2956 PT Time Calculation (min) (ACUTE ONLY): 44 min  Charges:  $Gait Training: 8-22 mins $Therapeutic Exercise: 8-22 mins $Therapeutic Activity: 8-22 mins                     [place] Pager [phone] Office [phone]    Kristin Lowe 07/03/2022, 2:25 PM

## 2022-07-04 DIAGNOSIS — Z8673 Personal history of transient ischemic attack (TIA), and cerebral infarction without residual deficits: Secondary | ICD-10-CM | POA: Diagnosis not present

## 2022-07-04 DIAGNOSIS — I10 Essential (primary) hypertension: Secondary | ICD-10-CM | POA: Diagnosis not present

## 2022-07-04 DIAGNOSIS — Z87891 Personal history of nicotine dependence: Secondary | ICD-10-CM | POA: Diagnosis not present

## 2022-07-04 DIAGNOSIS — Z85828 Personal history of other malignant neoplasm of skin: Secondary | ICD-10-CM | POA: Diagnosis not present

## 2022-07-04 DIAGNOSIS — M1712 Unilateral primary osteoarthritis, left knee: Secondary | ICD-10-CM | POA: Diagnosis not present

## 2022-07-04 DIAGNOSIS — I480 Paroxysmal atrial fibrillation: Secondary | ICD-10-CM | POA: Diagnosis not present

## 2022-07-04 NOTE — Progress Notes (Signed)
Orthopedic Tech Progress Note Patient Details:  Kristin Lowe [birth year] [id number]  Ortho Devices Type of Ortho Device: Knee Immobilizer Ortho Device/Splint Location: LLE Ortho Device/Splint Interventions: Application   Post Interventions Patient Tolerated: Well  Carlyle Mcelrath E Mardell Cragg 07/04/2022, 10:00 AM

## 2022-07-04 NOTE — Progress Notes (Signed)
   Subjective: 2 Days Post-Op Procedure(s) (LRB): TOTAL KNEE ARTHROPLASTY (Left) Patient reports pain as mild.   Patient seen in rounds for Dr. Theda Sers. Patient is resting in bed on exam this morning. No acute events overnight. She has been having difficulty with nausea/vomiting. Ambulated ~30 feet with PT. We will start therapy today.   Objective: Vital signs in last 24 hours: Temp:  [97.7 F (36.5 C)-99.1 F (37.3 C)] 99.1 F (37.3 C) (08/11 0615) Pulse Rate:  [65-79] 79 (08/11 0615) Resp:  [16-17] 17 (08/11 0615) BP: (140-172)/(73-83) 149/75 (08/11 0615) SpO2:  [96 %-99 %] 97 % (08/11 0615)  Intake/Output from previous day:  Intake/Output Summary (Last 24 hours) at 07/04/2022 0757 Last data filed at 07/04/2022 0646 Gross per 24 hour  Intake --  Output 2475 ml  Net -2475 ml     Intake/Output this shift: No intake/output data recorded.  Labs: Recent Labs    07/02/22 1333  HGB 12.6   Recent Labs    07/02/22 1333  WBC 6.4  RBC 4.28  HCT 40.4  PLT 233   Recent Labs    07/02/22 1333  CREATININE 0.79   No results for input(s): "LABPT", "INR" in the last 72 hours.  Exam: General - Patient is Alert and Oriented Extremity - Neurologically intact Sensation intact distally Intact pulses distally Dorsiflexion/Plantar flexion intact Dressing - dressing C/D/I Motor Function - intact, moving foot and toes well on exam.   Past Medical History:  Diagnosis Date   Arthritis    Bradycardia    Cancer (HCC)    skin cancer   History of kidney stones    Hypertension    Paroxysmal atrial fibrillation (HCC)    Pre-diabetes    Stroke (cerebrum) (HCC)     Assessment/Plan: 2 Days Post-Op Procedure(s) (LRB): TOTAL KNEE ARTHROPLASTY (Left) Principal Problem:   Osteoarthritis of left knee  Estimated body mass index is 26.79 kg/m as calculated from the following:   Height as of this encounter: '5\' 6"'$  (1.676 m).   Weight as of this encounter: 75.3 kg. Advance  diet Up with therapy   Patient's anticipated LOS is less than 2 midnights, meeting these requirements: - Younger than 4 - Lives within 1 hour of care - Has a competent adult at home to recover with post-op recover - NO history of  - Chronic pain requiring opiods  - Diabetes  - Coronary Artery Disease  - Heart failure  - Heart attack  - Stroke  - DVT/VTE  - Cardiac arrhythmia  - Respiratory Failure/COPD  - Renal failure  - Anemia  - Advanced Liver disease   Up with therapy Once patient clears therapy today plan for discharge home Patient has outpatient physical therapy scheduled for next week, I will work on getting it possibly pushed up earlier in the week Medications have been sent to the patient's pharmacy She will resume her Eliquis for DVT prevention She will follow-up in the office in 2 weeks for first postop appointment  Discussed nausea with patient, likely related to pain medication. Possibly would consider trying dilaudid if not improved.   Griffith Citron, PA-C Orthopedic Surgery [phone] 07/04/2022, 7:57 AM

## 2022-07-04 NOTE — Progress Notes (Signed)
Patient did not pass PT A. Lu Duffel made awareD [employer]

## 2022-07-04 NOTE — Progress Notes (Signed)
Physical Therapy Treatment Patient Details Name: Kristin Lowe MRN: [id number] DOB: January 24, 1941 Today's Date: 07/04/2022   History of Present Illness Pt is a 81yo female presenting s/p L-TKA on 07/02/22. PMH: HTN, PAF, hx of stroke, pre-diabetes, loop recorder.    PT Comments    Pt very cooperative and continues ltd by fatigue but with no c/o nausea/dizziness and decreased c/o pain this pm.  Pt requiring increased time but also noted with marked improvement in activity tolerance and up to ambulate 72' in halls.   Recommendations for follow up therapy are one component of a multi-disciplinary discharge planning process, led by the attending physician.  Recommendations may be updated based on patient status, additional functional criteria and insurance authorization.  Follow Up Recommendations  Follow physician's recommendations for discharge plan and follow up therapies     Assistance Recommended at Discharge Intermittent Supervision/Assistance  Patient can return home with the following A little help with bathing/dressing/bathroom;Assistance with cooking/housework;Assist for transportation;Help with stairs or ramp for entrance;A little help with walking and/or transfers   Equipment Recommendations  Rolling walker (2 wheels)    Recommendations for Other Services       Precautions / Restrictions Precautions Precautions: Fall Restrictions Weight Bearing Restrictions: No Other Position/Activity Restrictions: WBAT     Mobility  Bed Mobility Overal bed mobility: Needs Assistance Bed Mobility: Supine to Sit, Sit to Supine     Supine to sit: Min assist Sit to supine: Min assist   General bed mobility comments: INcreased time with cues for sequence and pt managing L LE with gait belt    Transfers Overall transfer level: Needs assistance Equipment used: Rolling walker (2 wheels) Transfers: Sit to/from Stand, Bed to chair/wheelchair/BSC Sit to Stand: Min assist   Step pivot  transfers: Min assist       General transfer comment: cues for LE management and use of UEs to self assist.  Step pvt recliner to bedside with RW    Ambulation/Gait Ambulation/Gait assistance: Min assist Gait Distance (Feet): 72 Feet Assistive device: Rolling walker (2 wheels) Gait Pattern/deviations: Step-to pattern, Decreased step length - right, Decreased step length - left, Shuffle, Trunk flexed Gait velocity: decr     General Gait Details: Cues for sequence, posture and position from RW.  Distance ltd fatigue   Stairs             Wheelchair Mobility    Modified Rankin (Stroke Patients Only)       Balance Overall balance assessment: Mild deficits observed, not formally tested                                          Cognition Arousal/Alertness: Awake/alert Behavior During Therapy: WFL for tasks assessed/performed Overall Cognitive Status: Within Functional Limits for tasks assessed                                          Exercises Total Joint Exercises Ankle Circles/Pumps: AROM, Both, 15 reps, Supine Quad Sets: AROM, Both, 10 reps, Supine Heel Slides: AAROM, Left, Supine, 15 reps Straight Leg Raises: AAROM, Left, Supine, 20 reps Goniometric ROM: AAROM L knee -5- 33 - pain ltd with muscle guarding    General Comments        Pertinent Vitals/Pain Pain Assessment Pain Assessment: 0-10 Pain  Score: 4  Pain Location: LEFT knee Pain Descriptors / Indicators: Aching, Sore Pain Intervention(s): Limited activity within patient's tolerance, Monitored during session, Premedicated before session, Ice applied    Home Living                          Prior Function            PT Goals (current goals can now be found in the care plan section) Acute Rehab PT Goals Patient Stated Goal: To return to hiking PT Goal Formulation: With patient Time For Goal Achievement: 07/09/22 Progress towards PT goals:  Progressing toward goals    Frequency    7X/week      PT Plan Current plan remains appropriate    Co-evaluation              AM-PAC PT "6 Clicks" Mobility   Outcome Measure  Help needed turning from your back to your side while in a flat bed without using bedrails?: A Little Help needed moving from lying on your back to sitting on the side of a flat bed without using bedrails?: A Little Help needed moving to and from a bed to a chair (including a wheelchair)?: A Little Help needed standing up from a chair using your arms (e.g., wheelchair or bedside chair)?: A Little Help needed to walk in hospital room?: A Little Help needed climbing 3-5 steps with a railing? : Total 6 Click Score: 16    End of Session Equipment Utilized During Treatment: Gait belt;Left knee immobilizer Activity Tolerance: Patient tolerated treatment well;Patient limited by fatigue Patient left: in bed;with call bell/phone within reach;with bed alarm set Nurse Communication: Mobility status PT Visit Diagnosis: Pain;Difficulty in walking, not elsewhere classified (R26.2) Pain - Right/Left: Left Pain - part of body: Knee     Time: 0768-0881 PT Time Calculation (min) (ACUTE ONLY): 33 min  Charges:  $Gait Training: 23-37 mins                     [place] Pager [phone] Office [phone]    Payal Stanforth 07/04/2022, 4:18 PM

## 2022-07-04 NOTE — Progress Notes (Signed)
Physical Therapy Treatment Patient Details Name: Kristin Lowe MRN: [id number] DOB: 1941/03/17 Today's Date: 07/04/2022   History of Present Illness Pt is a 82yo female presenting s/p L-TKA on 07/02/22. PMH: HTN, PAF, hx of stroke, pre-diabetes, loop recorder.    PT Comments    Pt continues very cooperative and with decreased c/o nausea but progressing slowly with mobility and limited by c/o fatigue, pain and mild dizziness.   Recommendations for follow up therapy are one component of a multi-disciplinary discharge planning process, led by the attending physician.  Recommendations may be updated based on patient status, additional functional criteria and insurance authorization.  Follow Up Recommendations  Follow physician's recommendations for discharge plan and follow up therapies     Assistance Recommended at Discharge Intermittent Supervision/Assistance  Patient can return home with the following A little help with bathing/dressing/bathroom;Assistance with cooking/housework;Assist for transportation;Help with stairs or ramp for entrance;A lot of help with walking and/or transfers   Equipment Recommendations  Rolling walker (2 wheels)    Recommendations for Other Services       Precautions / Restrictions Precautions Precautions: Fall Restrictions Weight Bearing Restrictions: No Other Position/Activity Restrictions: WBAT     Mobility  Bed Mobility Overal bed mobility: Needs Assistance Bed Mobility: Supine to Sit     Supine to sit: Min assist     General bed mobility comments: INcreased time with cues for sequence and assist to manage L LE    Transfers Overall transfer level: Needs assistance Equipment used: Rolling walker (2 wheels) Transfers: Sit to/from Stand Sit to Stand: Min assist, +2 physical assistance, +2 safety/equipment, From elevated surface           General transfer comment: cues for sequence and use of R LE to self assist     Ambulation/Gait Ambulation/Gait assistance: Min assist, +2 safety/equipment Gait Distance (Feet): 27 Feet Assistive device: Rolling walker (2 wheels) Gait Pattern/deviations: Step-to pattern, Decreased step length - right, Decreased step length - left, Shuffle, Trunk flexed Gait velocity: decr     General Gait Details: Cues for sequence, posture and position from RW.  Distance ltd by pain, nausea, fatigue, and c/o dizziness -   Stairs             Wheelchair Mobility    Modified Rankin (Stroke Patients Only)       Balance Overall balance assessment: Mild deficits observed, not formally tested                                          Cognition Arousal/Alertness: Awake/alert Behavior During Therapy: WFL for tasks assessed/performed Overall Cognitive Status: Within Functional Limits for tasks assessed                                          Exercises Total Joint Exercises Ankle Circles/Pumps: AROM, Both, 15 reps, Supine Quad Sets: AROM, Both, 10 reps, Supine Heel Slides: AAROM, Left, Supine, 15 reps Straight Leg Raises: AAROM, Left, Supine, 20 reps Goniometric ROM: AAROM L knee -5- 33 - pain ltd with muscle guarding    General Comments        Pertinent Vitals/Pain Pain Assessment Pain Assessment: 0-10 Pain Score: 7  Pain Location: LEFT knee Pain Descriptors / Indicators: Operative site guarding, Discomfort, Grimacing Pain Intervention(s): Limited activity within  patient's tolerance    Home Living                          Prior Function            PT Goals (current goals can now be found in the care plan section) Acute Rehab PT Goals Patient Stated Goal: To return to hiking PT Goal Formulation: With patient Time For Goal Achievement: 07/09/22 Progress towards PT goals: Progressing toward goals    Frequency    7X/week      PT Plan Current plan remains appropriate    Co-evaluation               AM-PAC PT "6 Clicks" Mobility   Outcome Measure  Help needed turning from your back to your side while in a flat bed without using bedrails?: A Little Help needed moving from lying on your back to sitting on the side of a flat bed without using bedrails?: A Little Help needed moving to and from a bed to a chair (including a wheelchair)?: A Little Help needed standing up from a chair using your arms (e.g., wheelchair or bedside chair)?: A Little Help needed to walk in hospital room?: A Lot Help needed climbing 3-5 steps with a railing? : Total 6 Click Score: 15    End of Session Equipment Utilized During Treatment: Gait belt Activity Tolerance: Patient limited by pain;Patient limited by fatigue Patient left: in chair;with call bell/phone within reach;with chair alarm set Nurse Communication: Mobility status PT Visit Diagnosis: Pain;Difficulty in walking, not elsewhere classified (R26.2) Pain - Right/Left: Left Pain - part of body: Knee     Time: 0812-0848 PT Time Calculation (min) (ACUTE ONLY): 36 min  Charges:  $Gait Training: 8-22 mins $Therapeutic Exercise: 8-22 mins                     [place] Pager [phone] Office [phone]    Kristin Lowe 07/04/2022, 12:14 PM

## 2022-07-05 DIAGNOSIS — I10 Essential (primary) hypertension: Secondary | ICD-10-CM | POA: Diagnosis not present

## 2022-07-05 DIAGNOSIS — Z85828 Personal history of other malignant neoplasm of skin: Secondary | ICD-10-CM | POA: Diagnosis not present

## 2022-07-05 DIAGNOSIS — M1712 Unilateral primary osteoarthritis, left knee: Secondary | ICD-10-CM | POA: Diagnosis not present

## 2022-07-05 DIAGNOSIS — Z87891 Personal history of nicotine dependence: Secondary | ICD-10-CM | POA: Diagnosis not present

## 2022-07-05 DIAGNOSIS — I480 Paroxysmal atrial fibrillation: Secondary | ICD-10-CM | POA: Diagnosis not present

## 2022-07-05 DIAGNOSIS — Z8673 Personal history of transient ischemic attack (TIA), and cerebral infarction without residual deficits: Secondary | ICD-10-CM | POA: Diagnosis not present

## 2022-07-05 NOTE — Progress Notes (Signed)
PT Cancellation Note  Patient Details Name: Kristin Lowe MRN: [id number] DOB: 1941-06-24   Cancelled Treatment:     PT pm session deferred - pt reports pain finally decreasing and does not wish to increase it again.  Will follow in am.   Suraj Ramdass 07/05/2022, 4:45 PM

## 2022-07-05 NOTE — Progress Notes (Signed)
Subjective: 3 Days Post-Op Procedure(s) (LRB): TOTAL KNEE ARTHROPLASTY (Left)  Patient reports pain as mild to moderate.  Notes that she feels much better today.  Denies fever, chills, N/V, CP, SOB.  Tolerating POs well.  Admits to flatus.  Believes that she is ready to go home today.  Objective:   VITALS:  Temp:  [98.4 F (36.9 C)-99 F (37.2 C)] 98.9 F (37.2 C) (08/12 0545) Pulse Rate:  [79-89] 79 (08/12 0545) Resp:  [17-20] 17 (08/12 0545) BP: (132-151)/(52-73) 132/52 (08/12 0545) SpO2:  [93 %-98 %] 93 % (08/12 0545)  General: WDWN patient in NAD. Psych:  Appropriate mood and affect. Neuro:  A&O x 3, Moving all extremities, sensation intact to light touch HEENT:  EOMs intact Chest:  Even non-labored respirations Skin:  Dressing C/D/I, no rashes or lesions Extremities: warm/dry, mild edema to L knee, no erythema or echymosis.  No lymphadenopathy. Pulses: Popliteus 2+ MSK:  ROM: lacks 5 degrees TKE, MMT: able to perform quad set, (-) Homan's    LABS Recent Labs    07/02/22 1333  HGB 12.6  WBC 6.4  PLT 233   Recent Labs    07/02/22 1333  CREATININE 0.79   No results for input(s): "LABPT", "INR" in the last 72 hours.   Assessment/Plan: 3 Days Post-Op Procedure(s) (LRB): TOTAL KNEE ARTHROPLASTY (Left)  Patient seen in rounds for Dr. Karna Dupes L LE DVT ppx:  Resume eliquis D/C home today after working with therapy. D/C scripts sent to patient's pharmacy. Plan for 2 week outpatient post-op visit with Dr. Theda Sers.   Mechele Claude PA-C [facility] Office:  [phone]

## 2022-07-05 NOTE — Plan of Care (Signed)

## 2022-07-05 NOTE — Progress Notes (Signed)
Physical Therapy Treatment Patient Details Name: QUETZALY EBNER MRN: [id number] DOB: 04/22/1941 Today's Date: 07/05/2022   History of Present Illness Pt is a 81yo female presenting s/p L-TKA on 07/02/22. PMH: HTN, PAF, hx of stroke, pre-diabetes, loop recorder.    PT Comments    Pt continues very cooperative but very limited this am 2* elevated pain level.  Pt assisted to/from [facility] with grimacing/guarding and min WB tolerated on L LE.  Pt had had pain and muscle relaxer premed and provided with cold packs before and after session.  RN aware.  Recommendations for follow up therapy are one component of a multi-disciplinary discharge planning process, led by the attending physician.  Recommendations may be updated based on patient status, additional functional criteria and insurance authorization.  Follow Up Recommendations  Follow physician's recommendations for discharge plan and follow up therapies     Assistance Recommended at Discharge Intermittent Supervision/Assistance  Patient can return home with the following A little help with bathing/dressing/bathroom;Assistance with cooking/housework;Assist for transportation;Help with stairs or ramp for entrance;A little help with walking and/or transfers   Equipment Recommendations  Rolling walker (2 wheels)    Recommendations for Other Services       Precautions / Restrictions Precautions Precautions: Fall Restrictions Weight Bearing Restrictions: No Other Position/Activity Restrictions: WBAT     Mobility  Bed Mobility Overal bed mobility: Needs Assistance Bed Mobility: Supine to Sit, Sit to Supine     Supine to sit: Min assist Sit to supine: Min assist   General bed mobility comments: INcreased time with cues for sequence and assist to manage L LE    Transfers Overall transfer level: Needs assistance Equipment used: Rolling walker (2 wheels) Transfers: Sit to/from Stand, Bed to chair/wheelchair/BSC Sit to Stand: Min  assist   Step pivot transfers: Min assist       General transfer comment: cues for LE management and use of UEs to self assist.  Step pvt recliner bed<>BSC    Ambulation/Gait               General Gait Details: Cues for sequence, posture and position from RW.  Pt ltd to step pvt to/from [facility] 2* elevated pain level and min WB tolerance on L LE.   Stairs             Wheelchair Mobility    Modified Rankin (Stroke Patients Only)       Balance Overall balance assessment: Mild deficits observed, not formally tested                                          Cognition Arousal/Alertness: Awake/alert Behavior During Therapy: WFL for tasks assessed/performed Overall Cognitive Status: Within Functional Limits for tasks assessed                                          Exercises      General Comments        Pertinent Vitals/Pain Pain Assessment Pain Assessment: 0-10 Pain Score: 10-Worst pain ever Pain Location: LEFT knee Pain Descriptors / Indicators: Aching, Sore Pain Intervention(s): Limited activity within patient's tolerance, Monitored during session, Premedicated before session, Ice applied    Home Living  Prior Function            PT Goals (current goals can now be found in the care plan section) Acute Rehab PT Goals Patient Stated Goal: To return to hiking PT Goal Formulation: With patient Time For Goal Achievement: 07/09/22 Progress towards PT goals: Not progressing toward goals - comment (increased pain level)    Frequency    7X/week      PT Plan Current plan remains appropriate    Co-evaluation              AM-PAC PT "6 Clicks" Mobility   Outcome Measure  Help needed turning from your back to your side while in a flat bed without using bedrails?: A Little Help needed moving from lying on your back to sitting on the side of a flat bed without using bedrails?:  A Little Help needed moving to and from a bed to a chair (including a wheelchair)?: A Little Help needed standing up from a chair using your arms (e.g., wheelchair or bedside chair)?: A Little Help needed to walk in hospital room?: Total Help needed climbing 3-5 steps with a railing? : Total 6 Click Score: 14    End of Session Equipment Utilized During Treatment: Gait belt;Left knee immobilizer Activity Tolerance: Patient limited by pain Patient left: in bed;with call bell/phone within reach;with bed alarm set Nurse Communication: Mobility status PT Visit Diagnosis: Pain;Difficulty in walking, not elsewhere classified (R26.2) Pain - Right/Left: Left Pain - part of body: Knee     Time: 8329-1916 PT Time Calculation (min) (ACUTE ONLY): 25 min  Charges:  $Therapeutic Activity: 8-22 mins                     [place] Pager [phone] Office [phone]    Jearld Hemp 07/05/2022, 12:46 PM

## 2022-07-05 NOTE — Plan of Care (Signed)
  Problem: Education: Goal: Knowledge of General Education information will improve Description Including pain rating scale, medication(s)/side effects and non-pharmacologic comfort measures Outcome: Progressing   

## 2022-07-05 NOTE — Progress Notes (Signed)
Notified Mechele Claude, [place] that patient is not ready to be discharged from a PT and pain control stand point. Mechele Claude, PA stated he would cancel the discharge order. Will continue to monitor patient.

## 2022-07-06 DIAGNOSIS — Z87891 Personal history of nicotine dependence: Secondary | ICD-10-CM | POA: Diagnosis not present

## 2022-07-06 DIAGNOSIS — I10 Essential (primary) hypertension: Secondary | ICD-10-CM | POA: Diagnosis not present

## 2022-07-06 DIAGNOSIS — Z8673 Personal history of transient ischemic attack (TIA), and cerebral infarction without residual deficits: Secondary | ICD-10-CM | POA: Diagnosis not present

## 2022-07-06 DIAGNOSIS — I480 Paroxysmal atrial fibrillation: Secondary | ICD-10-CM | POA: Diagnosis not present

## 2022-07-06 DIAGNOSIS — M1712 Unilateral primary osteoarthritis, left knee: Secondary | ICD-10-CM | POA: Diagnosis not present

## 2022-07-06 DIAGNOSIS — Z85828 Personal history of other malignant neoplasm of skin: Secondary | ICD-10-CM | POA: Diagnosis not present

## 2022-07-06 MED ORDER — METOCLOPRAMIDE HCL 5 MG PO TABS
10.0000 mg | ORAL_TABLET | Freq: Once | ORAL | Status: AC
  Administered 2022-07-06: 10 mg via ORAL
  Filled 2022-07-06: qty 2

## 2022-07-06 NOTE — Progress Notes (Signed)
BYANCA KASPER  MRN: [id number] DOB/Age: 06-02-1941 81 y.o. [facility] Procedure: Procedure(s) (LRB): TOTAL KNEE ARTHROPLASTY (Left)     Subjective: Up in chair and more comfortable, has not been receiving meds for constipation yet, passing flatus but no BM  Vital Signs Temp:  [98.5 F (36.9 C)-99 F (37.2 C)] 98.5 F (36.9 C) (08/13 0547) Pulse Rate:  [81-87] 81 (08/13 0547) Resp:  [18] 18 (08/13 0547) BP: (138-143)/(61-74) 138/69 (08/13 0547) SpO2:  [95 %-97 %] 95 % (08/13 0547)  Lab Results No results for input(s): "WBC", "HGB", "HCT", "PLT" in the last 72 hours. BMET No results for input(s): "NA", "K", "CL", "CO2", "GLUCOSE", "BUN", "CREATININE", "CALCIUM" in the last 72 hours. INR  Date Value Ref Range Status  05/15/2018 1.02  Final    Comment:    Performed at [facility], [place] [address] [place], [place] [address]     Exam I removed her ace wrap,mild sweeling as expected aquacel dry        Plan Spoke  with Nurse who will work on bowel regimen after PT DC home after PT and BM  Jenetta Loges PA-C  07/06/2022, 8:47 AM Contact # [phone]

## 2022-07-06 NOTE — Plan of Care (Signed)
Plan of care reviewed and discussed with the patient. 

## 2022-07-06 NOTE — Plan of Care (Signed)
  Problem: Education: Goal: Knowledge of General Education information will improve Description Including pain rating scale, medication(s)/side effects and non-pharmacologic comfort measures Outcome: Progressing   Problem: Activity: Goal: Risk for activity intolerance will decrease Outcome: Progressing   Problem: Safety: Goal: Ability to remain free from injury will improve Outcome: Progressing   

## 2022-07-06 NOTE — Progress Notes (Signed)
Physical Therapy Treatment Patient Details Name: Kristin Lowe MRN: [id number] DOB: 1941/01/31 Today's Date: 07/06/2022   History of Present Illness Pt is a 81yo female presenting s/p L-TKA on 07/02/22. PMH: HTN, PAF, hx of stroke, pre-diabetes, loop recorder.    PT Comments    Pt in good spirits and up to bathroom for toileting and hygiene at sink.  Pt assisted back to bed and performed HEP with written instruction provided and reviewed.  Pt continues to progress with mobility requiring decreased level of assist for all tasks but continues to require increased time and multiple rest breaks.  Pt hopeful for dc home this date.   Recommendations for follow up therapy are one component of a multi-disciplinary discharge planning process, led by the attending physician.  Recommendations may be updated based on patient status, additional functional criteria and insurance authorization.  Follow Up Recommendations  Follow physician's recommendations for discharge plan and follow up therapies     Assistance Recommended at Discharge Intermittent Supervision/Assistance  Patient can return home with the following A little help with bathing/dressing/bathroom;Assistance with cooking/housework;Assist for transportation;Help with stairs or ramp for entrance;A little help with walking and/or transfers   Equipment Recommendations  Rolling walker (2 wheels)    Recommendations for Other Services       Precautions / Restrictions Precautions Precautions: Fall Restrictions Weight Bearing Restrictions: No Other Position/Activity Restrictions: WBAT     Mobility  Bed Mobility Overal bed mobility: Needs Assistance Bed Mobility: Supine to Sit     Supine to sit: Min assist     General bed mobility comments: min assist to manage L LE - pt plans to sleep in recliner initially    Transfers Overall transfer level: Needs assistance Equipment used: Rolling walker (2 wheels) Transfers: Sit to/from  Stand Sit to Stand: Min guard, Supervision           General transfer comment: cues for LE management and use of UEs to self assist. From recliner, to/from [facility], and to EOB    Ambulation/Gait Ambulation/Gait assistance: Min guard Gait Distance (Feet): 15 Feet (15' twice to/from bathroom) Assistive device: Rolling walker (2 wheels) Gait Pattern/deviations: Step-to pattern, Decreased step length - right, Decreased step length - left, Shuffle, Trunk flexed Gait velocity: decr     General Gait Details: INcreased time with pt self-cues for sequence, posture and position from RW.   Stairs             Wheelchair Mobility    Modified Rankin (Stroke Patients Only)       Balance Overall balance assessment: Mild deficits observed, not formally tested                                          Cognition Arousal/Alertness: Awake/alert Behavior During Therapy: WFL for tasks assessed/performed Overall Cognitive Status: Within Functional Limits for tasks assessed                                          Exercises Total Joint Exercises Ankle Circles/Pumps: AROM, Both, 15 reps, Supine Quad Sets: AROM, Both, 10 reps, Supine Heel Slides: AAROM, Left, Supine, 15 reps Straight Leg Raises: AAROM, Left, Supine, 20 reps Goniometric ROM: AAROM - 5 - 33    General Comments  Pertinent Vitals/Pain Pain Assessment Pain Assessment: 0-10 Pain Score: 5  Pain Location: LEFT knee Pain Descriptors / Indicators: Aching, Sore Pain Intervention(s): Limited activity within patient's tolerance, Monitored during session, Premedicated before session    Home Living                          Prior Function            PT Goals (current goals can now be found in the care plan section) Acute Rehab PT Goals Patient Stated Goal: To return to hiking PT Goal Formulation: With patient Time For Goal Achievement: 07/09/22 Progress towards PT  goals: Progressing toward goals    Frequency    7X/week      PT Plan Current plan remains appropriate    Co-evaluation              AM-PAC PT "6 Clicks" Mobility   Outcome Measure  Help needed turning from your back to your side while in a flat bed without using bedrails?: A Little Help needed moving from lying on your back to sitting on the side of a flat bed without using bedrails?: A Little Help needed moving to and from a bed to a chair (including a wheelchair)?: A Little Help needed standing up from a chair using your arms (e.g., wheelchair or bedside chair)?: A Little Help needed to walk in hospital room?: A Little Help needed climbing 3-5 steps with a railing? : A Lot 6 Click Score: 17    End of Session Equipment Utilized During Treatment: Gait belt;Left knee immobilizer Activity Tolerance: Patient tolerated treatment well Patient left: in bed;with call bell/phone within reach;with family/visitor present Nurse Communication: Mobility status PT Visit Diagnosis: Pain;Difficulty in walking, not elsewhere classified (R26.2) Pain - Right/Left: Left Pain - part of body: Knee     Time: 6659-9357 PT Time Calculation (min) (ACUTE ONLY): 44 min  Charges:  $Gait Training: 8-22 mins $Therapeutic Exercise: 8-22 mins $Therapeutic Activity: 8-22 mins                     Debe Coder PT Acute Rehabilitation Services Pager [phone] Office [phone]    Fionna Merriott 07/06/2022, 12:46 PM

## 2022-07-06 NOTE — Progress Notes (Signed)
Physical Therapy Treatment Patient Details Name: Kristin Lowe MRN: [id number] DOB: May 31, 1941 Today's Date: 07/06/2022   History of Present Illness Pt is a 81yo female presenting s/p L-TKA on 07/02/22. PMH: HTN, PAF, hx of stroke, pre-diabetes, loop recorder.    PT Comments    Pt continues very cooperative and up to bathroom for toileting and hand hygiene standing at sink before ambulating increased distance in hall.  Pt requiring increased time and multiple brief rest breaks but with marked improvement in pain control and activity tolerance noted. Will follow up with therex program after rest break and next pain meds.  Recommendations for follow up therapy are one component of a multi-disciplinary discharge planning process, led by the attending physician.  Recommendations may be updated based on patient status, additional functional criteria and insurance authorization.  Follow Up Recommendations  Follow physician's recommendations for discharge plan and follow up therapies     Assistance Recommended at Discharge Intermittent Supervision/Assistance  Patient can return home with the following A little help with bathing/dressing/bathroom;Assistance with cooking/housework;Assist for transportation;Help with stairs or ramp for entrance;A little help with walking and/or transfers   Equipment Recommendations  Rolling walker (2 wheels)    Recommendations for Other Services       Precautions / Restrictions Precautions Precautions: Fall Restrictions Weight Bearing Restrictions: No Other Position/Activity Restrictions: WBAT     Mobility  Bed Mobility               General bed mobility comments: Pt up in chair and requests back to same    Transfers Overall transfer level: Needs assistance Equipment used: Rolling walker (2 wheels) Transfers: Sit to/from Stand Sit to Stand: Min guard           General transfer comment: cues for LE management and use of UEs to self  assist.  to/from recliner and BSC    Ambulation/Gait Ambulation/Gait assistance: Min guard Gait Distance (Feet): 90 Feet (and 15' into bathroom) Assistive device: Rolling walker (2 wheels) Gait Pattern/deviations: Step-to pattern, Decreased step length - right, Decreased step length - left, Shuffle, Trunk flexed Gait velocity: decr     General Gait Details: INcreased time with pt self-cues for sequence, posture and position from RW.   Stairs             Wheelchair Mobility    Modified Rankin (Stroke Patients Only)       Balance Overall balance assessment: Mild deficits observed, not formally tested                                          Cognition Arousal/Alertness: Awake/alert Behavior During Therapy: WFL for tasks assessed/performed Overall Cognitive Status: Within Functional Limits for tasks assessed                                          Exercises      General Comments        Pertinent Vitals/Pain Pain Assessment Pain Assessment: 0-10 Pain Score: 3  Pain Location: LEFT knee Pain Descriptors / Indicators: Aching, Sore Pain Intervention(s): Limited activity within patient's tolerance, Monitored during session, Premedicated before session, Ice applied    Home Living  Prior Function            PT Goals (current goals can now be found in the care plan section) Acute Rehab PT Goals Patient Stated Goal: To return to hiking PT Goal Formulation: With patient Time For Goal Achievement: 07/09/22 Progress towards PT goals: Progressing toward goals    Frequency    7X/week      PT Plan Current plan remains appropriate    Co-evaluation              AM-PAC PT "6 Clicks" Mobility   Outcome Measure  Help needed turning from your back to your side while in a flat bed without using bedrails?: A Little Help needed moving from lying on your back to sitting on the side of a  flat bed without using bedrails?: A Little Help needed moving to and from a bed to a chair (including a wheelchair)?: A Little Help needed standing up from a chair using your arms (e.g., wheelchair or bedside chair)?: A Little Help needed to walk in hospital room?: A Little Help needed climbing 3-5 steps with a railing? : A Lot 6 Click Score: 17    End of Session Equipment Utilized During Treatment: Gait belt;Left knee immobilizer Activity Tolerance: Patient tolerated treatment well Patient left: in chair;with call bell/phone within reach;with chair alarm set;with family/visitor present Nurse Communication: Mobility status PT Visit Diagnosis: Pain;Difficulty in walking, not elsewhere classified (R26.2) Pain - Right/Left: Left Pain - part of body: Knee     Time: 0076-2263 PT Time Calculation (min) (ACUTE ONLY): 44 min  Charges:  $Gait Training: 23-37 mins $Therapeutic Activity: 8-22 mins                     [place] Pager [phone] Office [phone]    Lyriq Finerty 07/06/2022, 10:29 AM

## 2022-07-07 DIAGNOSIS — M1712 Unilateral primary osteoarthritis, left knee: Secondary | ICD-10-CM | POA: Diagnosis not present

## 2022-07-07 DIAGNOSIS — I10 Essential (primary) hypertension: Secondary | ICD-10-CM | POA: Diagnosis not present

## 2022-07-07 DIAGNOSIS — Z87891 Personal history of nicotine dependence: Secondary | ICD-10-CM | POA: Diagnosis not present

## 2022-07-07 DIAGNOSIS — I480 Paroxysmal atrial fibrillation: Secondary | ICD-10-CM | POA: Diagnosis not present

## 2022-07-07 DIAGNOSIS — Z8673 Personal history of transient ischemic attack (TIA), and cerebral infarction without residual deficits: Secondary | ICD-10-CM | POA: Diagnosis not present

## 2022-07-07 DIAGNOSIS — Z85828 Personal history of other malignant neoplasm of skin: Secondary | ICD-10-CM | POA: Diagnosis not present

## 2022-07-07 NOTE — Plan of Care (Signed)
  Problem: Education: Goal: Knowledge of General Education information will improve Description: Including pain rating scale, medication(s)/side effects and non-pharmacologic comfort measures Outcome: Progressing   Problem: Health Behavior/Discharge Planning: Goal: Ability to manage health-related needs will improve Outcome: Progressing   Problem: Clinical Measurements: Goal: Ability to maintain clinical measurements within normal limits will improve Outcome: Progressing Goal: Will remain free from infection Outcome: Progressing Goal: Diagnostic test results will improve Outcome: Progressing Goal: Respiratory complications will improve Outcome: Progressing   Problem: Activity: Goal: Risk for activity intolerance will decrease Outcome: Progressing   Problem: Nutrition: Goal: Adequate nutrition will be maintained Outcome: Progressing   Problem: Coping: Goal: Level of anxiety will decrease Outcome: Progressing   Problem: Elimination: Goal: Will not experience complications related to bowel motility Outcome: Progressing   Problem: Safety: Goal: Ability to remain free from injury will improve Outcome: Progressing   Problem: Skin Integrity: Goal: Risk for impaired skin integrity will decrease Outcome: Progressing   Problem: Education: Goal: Knowledge of the prescribed therapeutic regimen will improve Outcome: Progressing   Problem: Activity: Goal: Ability to avoid complications of mobility impairment will improve Outcome: Progressing Goal: Range of joint motion will improve Outcome: Progressing   Problem: Skin Integrity: Goal: Will show signs of wound healing Outcome: Progressing

## 2022-07-07 NOTE — Progress Notes (Signed)
Subjective: 5 Days Post-Op Procedure(s) (LRB): TOTAL KNEE ARTHROPLASTY (Left) Patient reports pain as mild.   Patient was able to have a bowel movement this morning Doing well, no complaints for overnight  Objective: Vital signs in last 24 hours: Temp:  [97.8 F (36.6 C)-98.2 F (36.8 C)] 98.1 F (36.7 C) (08/14 0648) Pulse Rate:  [89-94] 94 (08/14 0648) Resp:  [16-18] 18 (08/14 0648) BP: (145-174)/(68-90) 157/86 (08/14 0648) SpO2:  [97 %-99 %] 97 % (08/14 0648)  Intake/Output from previous day: 08/13 0701 - 08/14 0700 In: 600 [P.O.:600] Out: 0  Intake/Output this shift: No intake/output data recorded.  No results for input(s): "HGB" in the last 72 hours. No results for input(s): "WBC", "RBC", "HCT", "PLT" in the last 72 hours. No results for input(s): "NA", "K", "CL", "CO2", "BUN", "CREATININE", "GLUCOSE", "CALCIUM" in the last 72 hours. No results for input(s): "LABPT", "INR" in the last 72 hours.  Neurologically intact Neurovascular intact Sensation intact distally Intact pulses distally Dorsiflexion/Plantar flexion intact Incision: dressing C/D/I Compartment soft   Assessment/Plan: 5 Days Post-Op Procedure(s) (LRB): TOTAL KNEE ARTHROPLASTY (Left) Advance diet Up with therapy Patient doing well, will be discharged home today She will follow-up in the office in 2 weeks She was preservers physical therapy appointment this morning we will get a change for her Medications of already been sent to the pharmacy  Anticipated LOS equal to or greater than 2 midnights due to - Age 79 and older with one or more of the following:  - Obesity  - Expected need for hospital services (PT, OT, Nursing) required for safe  discharge  - Anticipated need for postoperative skilled nursing care or inpatient rehab  - Active co-morbidities: None OR   - Unanticipated findings during/Post Surgery: None  - Patient is a high risk of re-admission due to: None   Nettie Elm [facility] [phone] 07/07/2022, 7:23 AM

## 2022-07-07 NOTE — Progress Notes (Signed)
Physical Therapy Treatment Patient Details Name: Kristin Lowe MRN: [id number] DOB: Jun 13, 1941 Today's Date: 07/07/2022   History of Present Illness Pt is a 81yo female presenting s/p L-TKA on 07/02/22. PMH: HTN, PAF, hx of stroke, pre-diabetes, loop recorder.    PT Comments    Pt making gradual progress.  She was able to demonstrate stair training to simulate home entry - still plans to sleep downstairs at this time.  Pt with slow limited gait but is steady.  She has support for discharge and necessary DME.  Ambulated 30' with RW and performed stair with min guard.   Pt demonstrates safe gait & transfers in order to return home from PT perspective once discharged by MD.  While in hospital, will continue to benefit from PT for skilled therapy to advance mobility and exercises.      Recommendations for follow up therapy are one component of a multi-disciplinary discharge planning process, led by the attending physician.  Recommendations may be updated based on patient status, additional functional criteria and insurance authorization.  Follow Up Recommendations  Follow physician's recommendations for discharge plan and follow up therapies     Assistance Recommended at Discharge Intermittent Supervision/Assistance  Patient can return home with the following A little help with bathing/dressing/bathroom;Assistance with cooking/housework;Assist for transportation;Help with stairs or ramp for entrance;A little help with walking and/or transfers   Equipment Recommendations  Rolling walker (2 wheels)    Recommendations for Other Services       Precautions / Restrictions Precautions Precautions: Fall Restrictions Weight Bearing Restrictions: Yes LLE Weight Bearing: Weight bearing as tolerated     Mobility  Bed Mobility Overal bed mobility: Needs Assistance Bed Mobility: Supine to Sit     Supine to sit: Supervision, HOB elevated     General bed mobility comments: Pt utilizing gait  belt to assist L LE    Transfers Overall transfer level: Needs assistance Equipment used: Rolling walker (2 wheels) Transfers: Sit to/from Stand Sit to Stand: Supervision           General transfer comment: Performed from bed x 2, toliet x 1; pt with good hand placement and L LE management wihtout cues    Ambulation/Gait Ambulation/Gait assistance: Min guard, Supervision Gait Distance (Feet): 30 Feet (15' then 30') Assistive device: Rolling walker (2 wheels) Gait Pattern/deviations: Decreased stride length, Decreased dorsiflexion - left, Decreased weight shift to left, Step-to pattern Gait velocity: decreased     General Gait Details: Cues for heel strike on L  and posture; pt with slow but steady step to gait   Stairs Stairs: Yes Stairs assistance: Min guard Stair Management: Step to pattern, Backwards, With walker Number of Stairs: 1 General stair comments: Pt has 1 step on side walk and then walks to threshold.  Performed 1 6" step backward with RW with cues.  Later demonstrated technique to son   Wheelchair Mobility    Modified Rankin (Stroke Patients Only)       Balance Overall balance assessment: Needs assistance Sitting-balance support: No upper extremity supported Sitting balance-Leahy Scale: Good     Standing balance support: Bilateral upper extremity supported, Single extremity supported, Reliant on assistive device for balance Standing balance-Leahy Scale: Poor Standing balance comment: RW to ambulate; grab bar with ADLs; steady with UE support                            Cognition Arousal/Alertness: Awake/alert Behavior During Therapy: [facility] for tasks  assessed/performed Overall Cognitive Status: Within Functional Limits for tasks assessed                                          Exercises Total Joint Exercises Ankle Circles/Pumps: AROM, Both, 15 reps, Supine Quad Sets: AROM, Both, Supine, 15 reps Knee Flexion: AAROM,  Left, 5 reps, Seated (To tolerance, using R foot to assist) Goniometric ROM: L knee ROM : 5 to 40 degrees    General Comments General comments (skin integrity, edema, etc.): Educated on HEP, safe ice use, TED hose, stairs, no pivots, resting with leg straight, and encouraged mobility every 1-2 hours during day      Pertinent Vitals/Pain Pain Assessment Pain Assessment: 0-10 Pain Score: 4  Pain Location: LEFT knee Pain Descriptors / Indicators: Aching, Sore Pain Intervention(s): Limited activity within patient's tolerance, Monitored during session, Premedicated before session    Home Living                          Prior Function            PT Goals (current goals can now be found in the care plan section) Progress towards PT goals: Progressing toward goals    Frequency    7X/week      PT Plan Current plan remains appropriate    Co-evaluation              AM-PAC PT "6 Clicks" Mobility   Outcome Measure  Help needed turning from your back to your side while in a flat bed without using bedrails?: A Little Help needed moving from lying on your back to sitting on the side of a flat bed without using bedrails?: A Little Help needed moving to and from a bed to a chair (including a wheelchair)?: A Little Help needed standing up from a chair using your arms (e.g., wheelchair or bedside chair)?: A Little Help needed to walk in hospital room?: A Little Help needed climbing 3-5 steps with a railing? : A Little 6 Click Score: 18    End of Session Equipment Utilized During Treatment: Gait belt Activity Tolerance: Patient tolerated treatment well Patient left: with call bell/phone within reach;with family/visitor present;in chair (NT coming to dress pt for d/c) Nurse Communication: Mobility status PT Visit Diagnosis: Pain;Difficulty in walking, not elsewhere classified (R26.2) Pain - Right/Left: Left Pain - part of body: Knee     Time: 2956-2130 PT Time  Calculation (min) (ACUTE ONLY): 40 min  Charges:  $Gait Training: 8-22 mins $Therapeutic Exercise: 8-22 mins $Therapeutic Activity: 8-22 mins                     Abran Richard, PT Acute Rehab [facility] [phone]    Kristin Lowe 07/07/2022, 10:47 AM

## 2022-07-07 NOTE — Progress Notes (Signed)
Pt is ready for discharge.  PIV removed.  Remains alert and oriented.  Pt's son at bedside.  AVS given.  Pt able to verbalize understanding of all discharge instructions, including wound care.  All questions answered.

## 2022-07-07 NOTE — Plan of Care (Signed)
  Problem: Coping: Goal: Level of anxiety will decrease Outcome: Progressing   Problem: Elimination: Goal: Will not experience complications related to bowel motility Outcome: Progressing   Problem: Pain Managment: Goal: General experience of comfort will improve Outcome: Progressing   

## 2022-07-11 DIAGNOSIS — Z9841 Cataract extraction status, right eye: Secondary | ICD-10-CM | POA: Diagnosis not present

## 2022-07-11 DIAGNOSIS — I480 Paroxysmal atrial fibrillation: Secondary | ICD-10-CM | POA: Diagnosis not present

## 2022-07-11 DIAGNOSIS — G5601 Carpal tunnel syndrome, right upper limb: Secondary | ICD-10-CM | POA: Diagnosis not present

## 2022-07-11 DIAGNOSIS — Z87891 Personal history of nicotine dependence: Secondary | ICD-10-CM | POA: Diagnosis not present

## 2022-07-11 DIAGNOSIS — Z471 Aftercare following joint replacement surgery: Secondary | ICD-10-CM | POA: Diagnosis not present

## 2022-07-11 DIAGNOSIS — R7303 Prediabetes: Secondary | ICD-10-CM | POA: Diagnosis not present

## 2022-07-11 DIAGNOSIS — Z8673 Personal history of transient ischemic attack (TIA), and cerebral infarction without residual deficits: Secondary | ICD-10-CM | POA: Diagnosis not present

## 2022-07-11 DIAGNOSIS — Z7901 Long term (current) use of anticoagulants: Secondary | ICD-10-CM | POA: Diagnosis not present

## 2022-07-11 DIAGNOSIS — K5900 Constipation, unspecified: Secondary | ICD-10-CM | POA: Diagnosis not present

## 2022-07-11 DIAGNOSIS — G4733 Obstructive sleep apnea (adult) (pediatric): Secondary | ICD-10-CM | POA: Diagnosis not present

## 2022-07-11 DIAGNOSIS — Z9842 Cataract extraction status, left eye: Secondary | ICD-10-CM | POA: Diagnosis not present

## 2022-07-11 DIAGNOSIS — Z9181 History of falling: Secondary | ICD-10-CM | POA: Diagnosis not present

## 2022-07-11 DIAGNOSIS — Z85828 Personal history of other malignant neoplasm of skin: Secondary | ICD-10-CM | POA: Diagnosis not present

## 2022-07-11 DIAGNOSIS — I10 Essential (primary) hypertension: Secondary | ICD-10-CM | POA: Diagnosis not present

## 2022-07-11 DIAGNOSIS — Z96652 Presence of left artificial knee joint: Secondary | ICD-10-CM | POA: Diagnosis not present

## 2022-07-11 DIAGNOSIS — E7800 Pure hypercholesterolemia, unspecified: Secondary | ICD-10-CM | POA: Diagnosis not present

## 2022-07-14 DIAGNOSIS — I480 Paroxysmal atrial fibrillation: Secondary | ICD-10-CM | POA: Diagnosis not present

## 2022-07-14 DIAGNOSIS — R7303 Prediabetes: Secondary | ICD-10-CM | POA: Diagnosis not present

## 2022-07-14 DIAGNOSIS — G4733 Obstructive sleep apnea (adult) (pediatric): Secondary | ICD-10-CM | POA: Diagnosis not present

## 2022-07-14 DIAGNOSIS — I10 Essential (primary) hypertension: Secondary | ICD-10-CM | POA: Diagnosis not present

## 2022-07-14 DIAGNOSIS — Z471 Aftercare following joint replacement surgery: Secondary | ICD-10-CM | POA: Diagnosis not present

## 2022-07-14 DIAGNOSIS — K5900 Constipation, unspecified: Secondary | ICD-10-CM | POA: Diagnosis not present

## 2022-07-16 DIAGNOSIS — R7303 Prediabetes: Secondary | ICD-10-CM | POA: Diagnosis not present

## 2022-07-16 DIAGNOSIS — Z4789 Encounter for other orthopedic aftercare: Secondary | ICD-10-CM | POA: Diagnosis not present

## 2022-07-16 DIAGNOSIS — Z471 Aftercare following joint replacement surgery: Secondary | ICD-10-CM | POA: Diagnosis not present

## 2022-07-16 DIAGNOSIS — K5900 Constipation, unspecified: Secondary | ICD-10-CM | POA: Diagnosis not present

## 2022-07-16 DIAGNOSIS — G4733 Obstructive sleep apnea (adult) (pediatric): Secondary | ICD-10-CM | POA: Diagnosis not present

## 2022-07-16 DIAGNOSIS — I10 Essential (primary) hypertension: Secondary | ICD-10-CM | POA: Diagnosis not present

## 2022-07-16 DIAGNOSIS — I480 Paroxysmal atrial fibrillation: Secondary | ICD-10-CM | POA: Diagnosis not present

## 2022-07-18 DIAGNOSIS — K5900 Constipation, unspecified: Secondary | ICD-10-CM | POA: Diagnosis not present

## 2022-07-18 DIAGNOSIS — R7303 Prediabetes: Secondary | ICD-10-CM | POA: Diagnosis not present

## 2022-07-18 DIAGNOSIS — Z471 Aftercare following joint replacement surgery: Secondary | ICD-10-CM | POA: Diagnosis not present

## 2022-07-18 DIAGNOSIS — G4733 Obstructive sleep apnea (adult) (pediatric): Secondary | ICD-10-CM | POA: Diagnosis not present

## 2022-07-18 DIAGNOSIS — I10 Essential (primary) hypertension: Secondary | ICD-10-CM | POA: Diagnosis not present

## 2022-07-18 DIAGNOSIS — I480 Paroxysmal atrial fibrillation: Secondary | ICD-10-CM | POA: Diagnosis not present

## 2022-07-21 DIAGNOSIS — G4733 Obstructive sleep apnea (adult) (pediatric): Secondary | ICD-10-CM | POA: Diagnosis not present

## 2022-07-21 DIAGNOSIS — I480 Paroxysmal atrial fibrillation: Secondary | ICD-10-CM | POA: Diagnosis not present

## 2022-07-21 DIAGNOSIS — K5900 Constipation, unspecified: Secondary | ICD-10-CM | POA: Diagnosis not present

## 2022-07-21 DIAGNOSIS — Z471 Aftercare following joint replacement surgery: Secondary | ICD-10-CM | POA: Diagnosis not present

## 2022-07-21 DIAGNOSIS — R7303 Prediabetes: Secondary | ICD-10-CM | POA: Diagnosis not present

## 2022-07-21 DIAGNOSIS — I10 Essential (primary) hypertension: Secondary | ICD-10-CM | POA: Diagnosis not present

## 2022-07-23 DIAGNOSIS — K5900 Constipation, unspecified: Secondary | ICD-10-CM | POA: Diagnosis not present

## 2022-07-23 DIAGNOSIS — Z471 Aftercare following joint replacement surgery: Secondary | ICD-10-CM | POA: Diagnosis not present

## 2022-07-23 DIAGNOSIS — I10 Essential (primary) hypertension: Secondary | ICD-10-CM | POA: Diagnosis not present

## 2022-07-23 DIAGNOSIS — G4733 Obstructive sleep apnea (adult) (pediatric): Secondary | ICD-10-CM | POA: Diagnosis not present

## 2022-07-23 DIAGNOSIS — R7303 Prediabetes: Secondary | ICD-10-CM | POA: Diagnosis not present

## 2022-07-23 DIAGNOSIS — I480 Paroxysmal atrial fibrillation: Secondary | ICD-10-CM | POA: Diagnosis not present

## 2022-07-25 DIAGNOSIS — R7303 Prediabetes: Secondary | ICD-10-CM | POA: Diagnosis not present

## 2022-07-25 DIAGNOSIS — Z471 Aftercare following joint replacement surgery: Secondary | ICD-10-CM | POA: Diagnosis not present

## 2022-07-25 DIAGNOSIS — K5900 Constipation, unspecified: Secondary | ICD-10-CM | POA: Diagnosis not present

## 2022-07-25 DIAGNOSIS — G4733 Obstructive sleep apnea (adult) (pediatric): Secondary | ICD-10-CM | POA: Diagnosis not present

## 2022-07-25 DIAGNOSIS — I480 Paroxysmal atrial fibrillation: Secondary | ICD-10-CM | POA: Diagnosis not present

## 2022-07-25 DIAGNOSIS — I10 Essential (primary) hypertension: Secondary | ICD-10-CM | POA: Diagnosis not present

## 2022-07-28 DIAGNOSIS — G4733 Obstructive sleep apnea (adult) (pediatric): Secondary | ICD-10-CM | POA: Diagnosis not present

## 2022-07-28 DIAGNOSIS — Z471 Aftercare following joint replacement surgery: Secondary | ICD-10-CM | POA: Diagnosis not present

## 2022-07-28 DIAGNOSIS — I480 Paroxysmal atrial fibrillation: Secondary | ICD-10-CM | POA: Diagnosis not present

## 2022-07-28 DIAGNOSIS — K5900 Constipation, unspecified: Secondary | ICD-10-CM | POA: Diagnosis not present

## 2022-07-28 DIAGNOSIS — I10 Essential (primary) hypertension: Secondary | ICD-10-CM | POA: Diagnosis not present

## 2022-07-28 DIAGNOSIS — R7303 Prediabetes: Secondary | ICD-10-CM | POA: Diagnosis not present

## 2022-07-30 DIAGNOSIS — R7303 Prediabetes: Secondary | ICD-10-CM | POA: Diagnosis not present

## 2022-07-30 DIAGNOSIS — I10 Essential (primary) hypertension: Secondary | ICD-10-CM | POA: Diagnosis not present

## 2022-07-30 DIAGNOSIS — K5900 Constipation, unspecified: Secondary | ICD-10-CM | POA: Diagnosis not present

## 2022-07-30 DIAGNOSIS — G4733 Obstructive sleep apnea (adult) (pediatric): Secondary | ICD-10-CM | POA: Diagnosis not present

## 2022-07-30 DIAGNOSIS — I480 Paroxysmal atrial fibrillation: Secondary | ICD-10-CM | POA: Diagnosis not present

## 2022-07-30 DIAGNOSIS — Z471 Aftercare following joint replacement surgery: Secondary | ICD-10-CM | POA: Diagnosis not present

## 2022-07-31 ENCOUNTER — Telehealth (HOSPITAL_BASED_OUTPATIENT_CLINIC_OR_DEPARTMENT_OTHER): Payer: Self-pay | Admitting: Cardiology

## 2022-07-31 DIAGNOSIS — M25562 Pain in left knee: Secondary | ICD-10-CM | POA: Diagnosis not present

## 2022-07-31 NOTE — Telephone Encounter (Signed)
Recommend trying Tylenol first before considering other options

## 2022-07-31 NOTE — Telephone Encounter (Signed)
Up to '1000mg'$  three times daily.  Not to exceed 3000 mg/day

## 2022-07-31 NOTE — Telephone Encounter (Signed)
Patient calling to say she had knee surgery and she needs some type inflammation medication she can take. Please advise

## 2022-07-31 NOTE — Telephone Encounter (Signed)
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 Patient states she used to take Tumeric and wants to know if she can start taking that again. She states at night she has a real dull ache, the surgeons nurse told her than an antiinflammatory med could take, but patient is unsure what she can take. She has taken tramadol previously and it messed up her stomach.   Please advise

## 2022-08-01 NOTE — Telephone Encounter (Signed)
Returned call to patient and provided the following recommendations,"Up to '1000mg'$  three times daily.  Not to exceed 3000 mg/day" patient says this is not helping her inflammation. Patient states she can not sleep at night without taking an oxycodone to help with the inflammation she can feel at the bone. Advised patient to reach out to her surgical team for advisement. She states that she did and they told her to take ibuprofen but she cannot take this due to being on Eliquis.

## 2022-08-01 NOTE — Telephone Encounter (Signed)
If left arm predominately numb with positional changes (I.e. laying a certain way) likely nerve related due to position.  Recommend elevating the arm when sitting.  If it persist she likely should pursue additional evaluation with her primary care provider.  Given no swelling, redness - low suspicion for blood clot if no swelling, redness.  Loel Dubonnet, NP

## 2022-08-01 NOTE — Telephone Encounter (Signed)
Recommendations called to patient, who is very appreciative of the call.    "If left arm predominately numb with positional changes (I.e. laying a certain way) likely nerve related due to position.  Recommend elevating the arm when sitting.  If it persist she likely should pursue additional evaluation with her primary care provider.   Given no swelling, redness - low suspicion for blood clot if no swelling, redness.   Loel Dubonnet, NP "

## 2022-08-01 NOTE — Telephone Encounter (Signed)
Returned call to patient, provided the following recommendations.    "Can try topical Voltaren gel but agree with reaching out to surgeon's office"   Patient states that she has one more question, she states that she has been laying on her back a lot due to the surgery. Her left arm and hand are going numb. No swelling, heat, or discoloration to the extremity. Patient endorses that this is from lack of use but her daughter wanted her to tell us.   No immediate signs of concern but will have Np in office take a look at the note today!

## 2022-08-01 NOTE — Telephone Encounter (Signed)
Can try topical Voltaren gel but agree with reaching out to surgeon's office

## 2022-08-04 DIAGNOSIS — M25562 Pain in left knee: Secondary | ICD-10-CM | POA: Diagnosis not present

## 2022-08-07 DIAGNOSIS — M25562 Pain in left knee: Secondary | ICD-10-CM | POA: Diagnosis not present

## 2022-08-12 DIAGNOSIS — M25562 Pain in left knee: Secondary | ICD-10-CM | POA: Diagnosis not present

## 2022-08-15 DIAGNOSIS — M25562 Pain in left knee: Secondary | ICD-10-CM | POA: Diagnosis not present

## 2022-08-18 IMAGING — MG MM DIGITAL SCREENING BILAT W/ TOMO AND CAD
6 of 10 series · 6 of 30 positions shown · non-contrast
Comparison: Previous exam(s).

CLINICAL DATA: Screening.

EXAM:
DIGITAL SCREENING BILATERAL MAMMOGRAM WITH TOMOSYNTHESIS AND CAD
TECHNIQUE: Bilateral screening digital craniocaudal and mediolateral oblique
mammograms were obtained. Bilateral screening digital breast
tomosynthesis was performed. The images were evaluated with
computer-aided detection.

[R CC synth-2D]
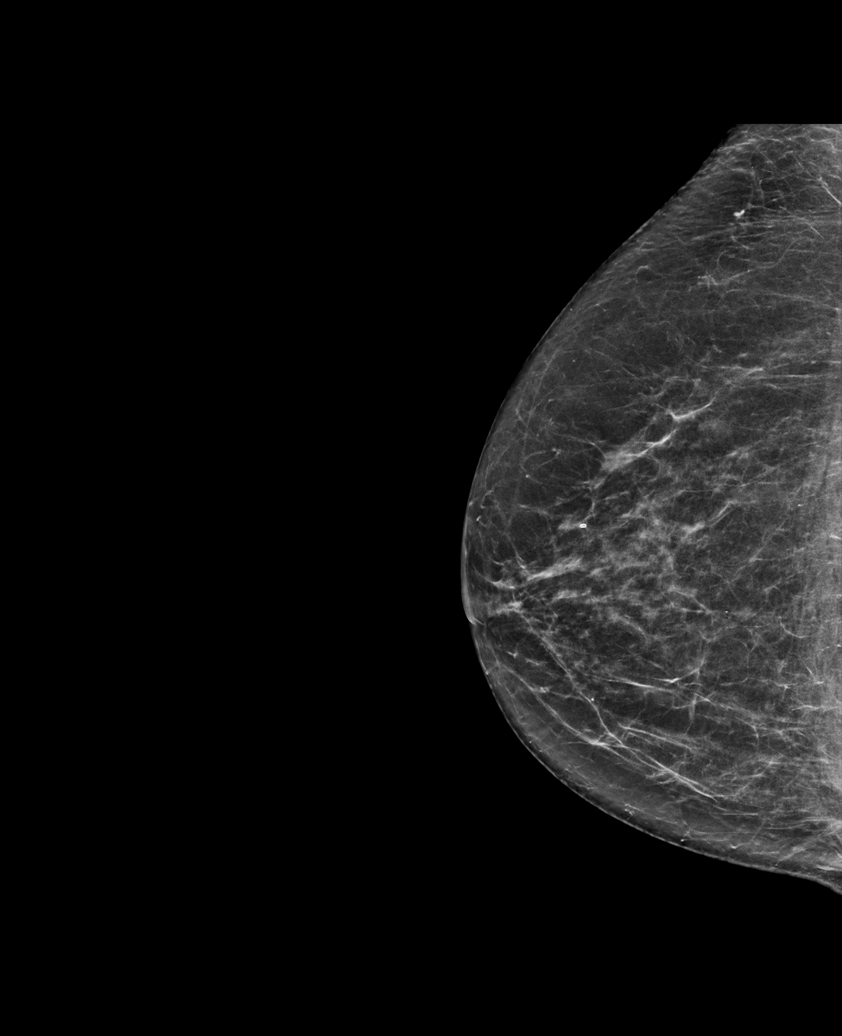

[R MLO synth-2D]
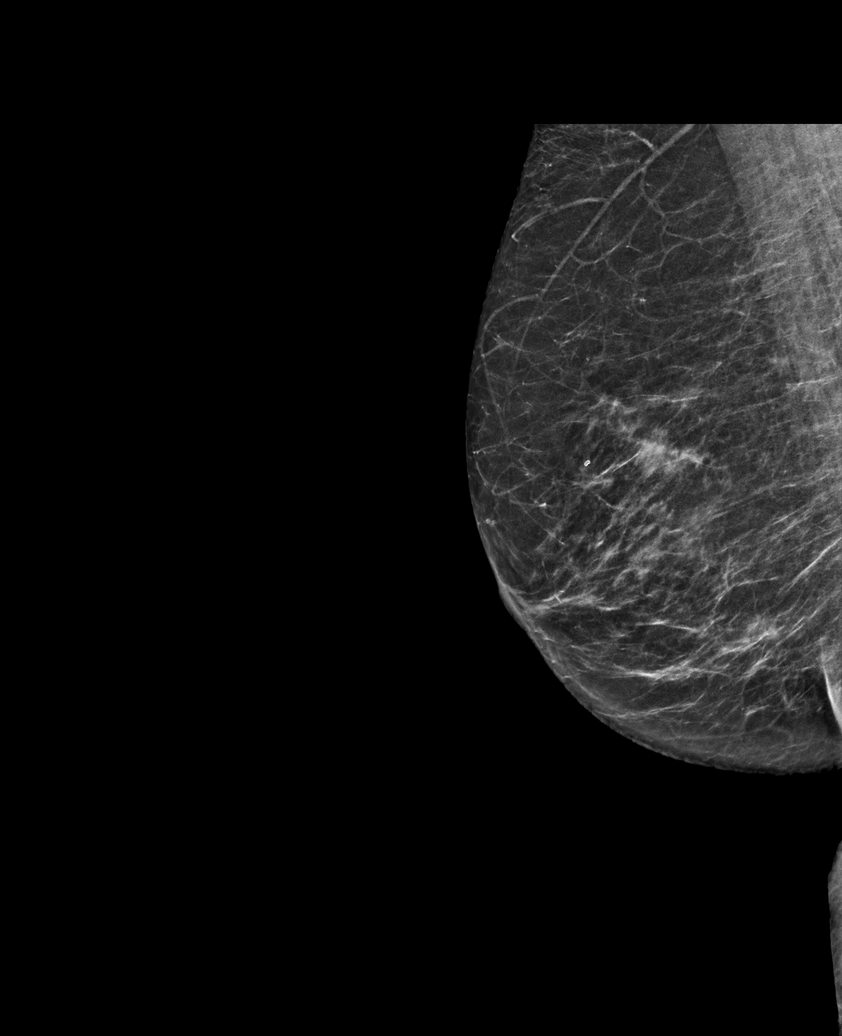

[L CC synth-2D (1 of 2)]
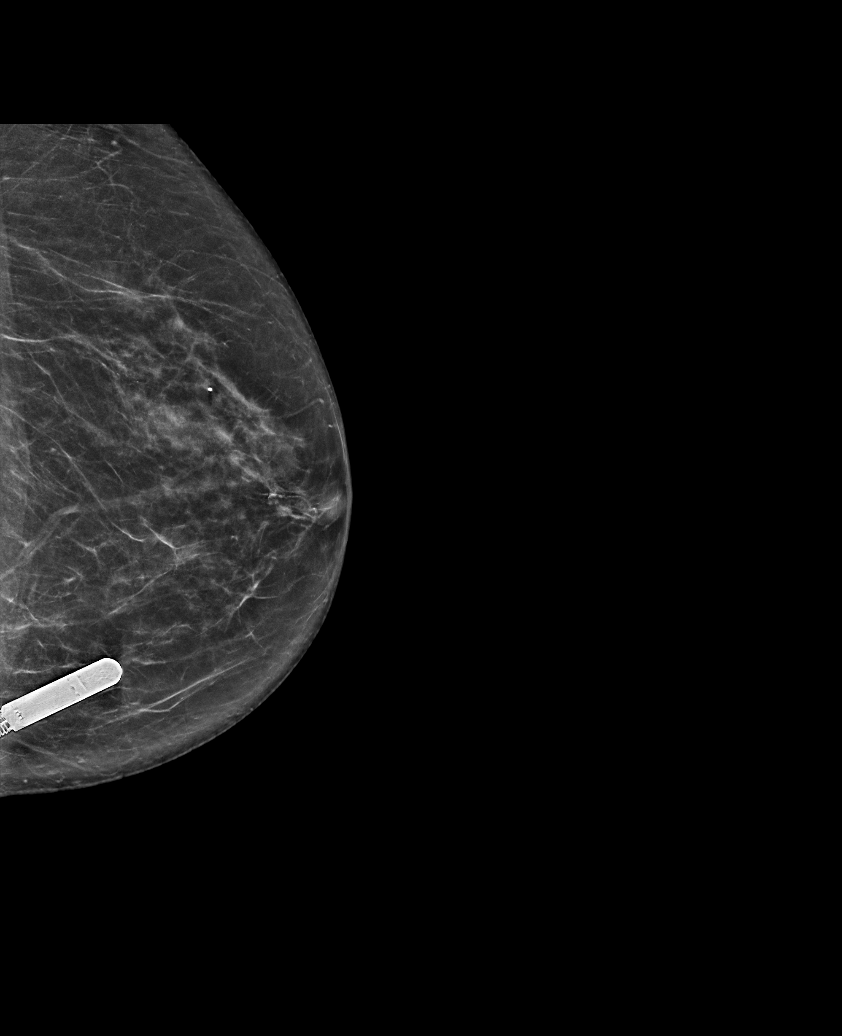

[L CC synth-2D (2 of 2)]
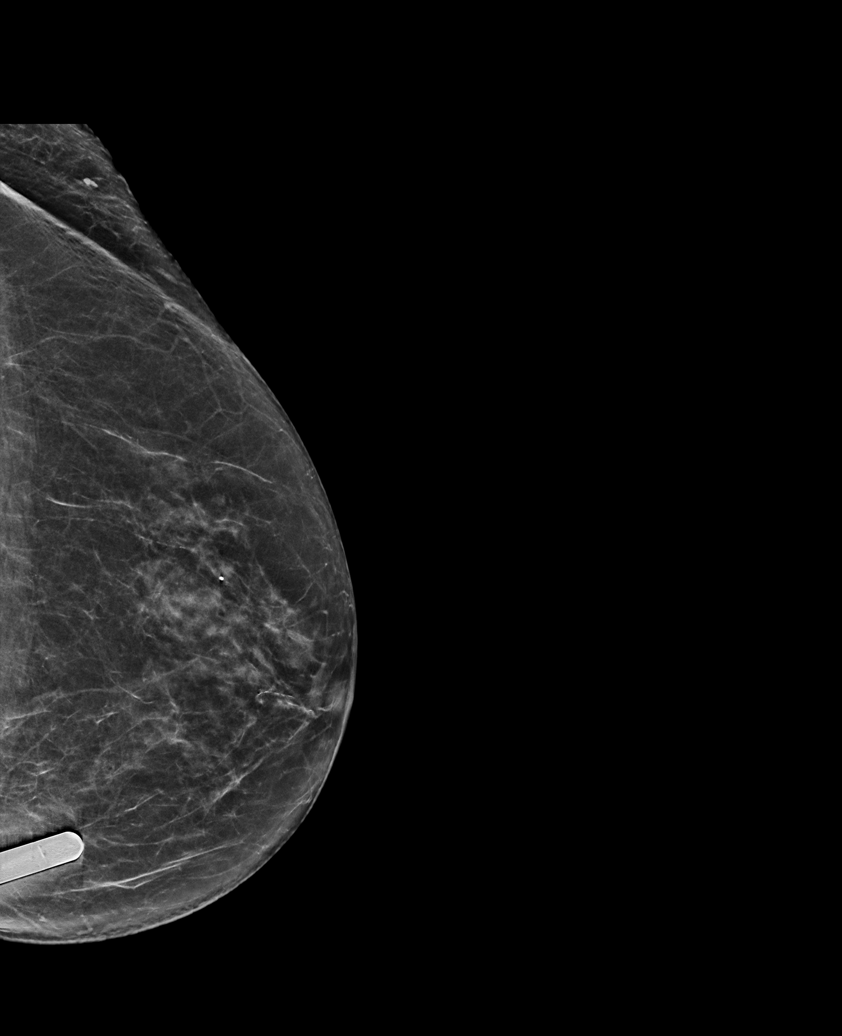

[L MLO synth-2D]
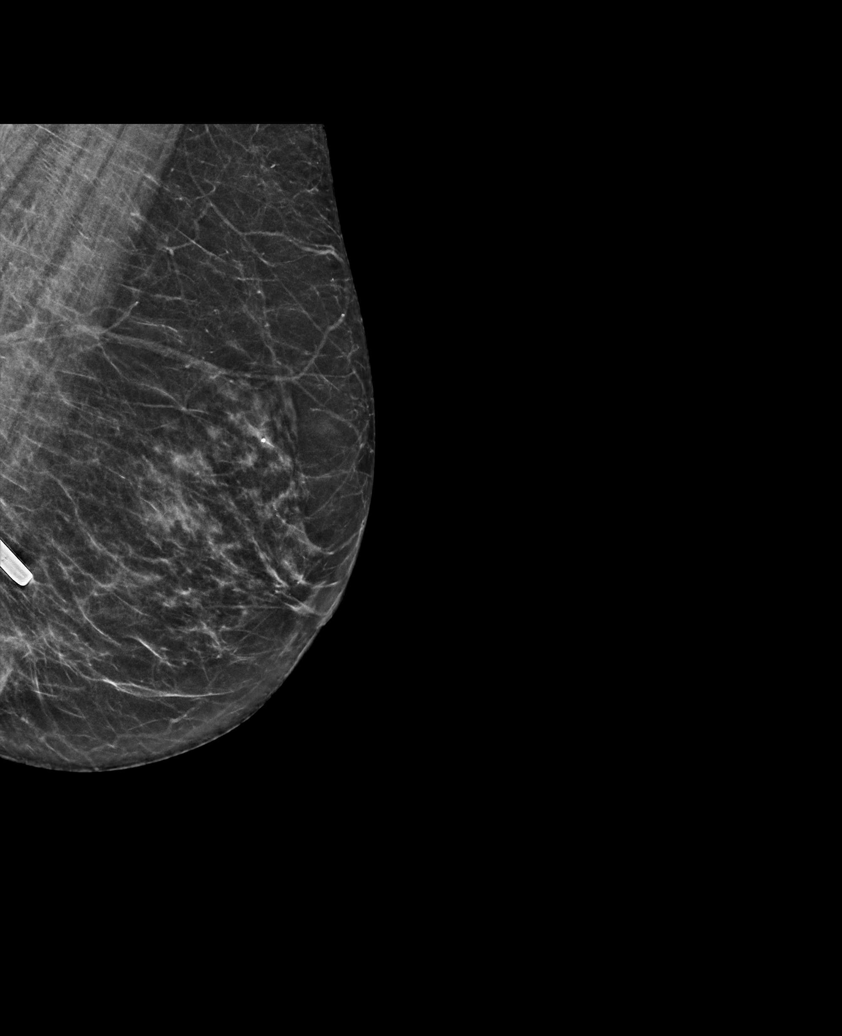

[R CC tomo · tomo slice 31/62.0]
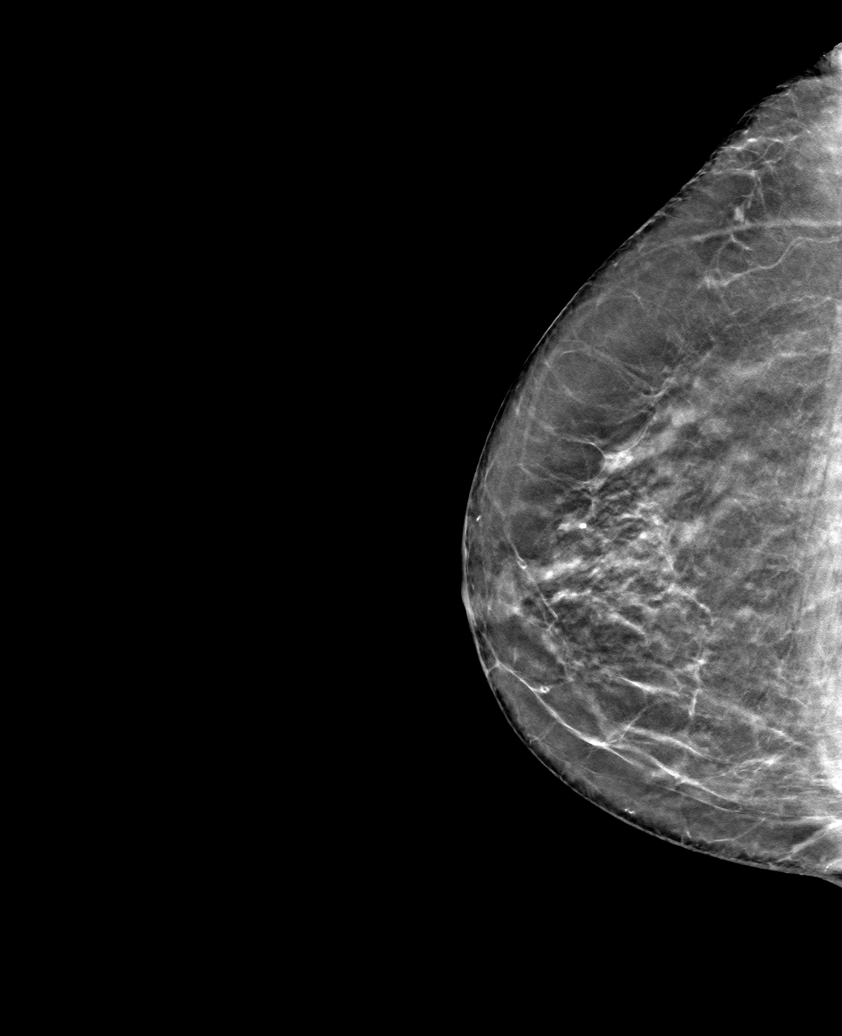

[6 of 30 positions shown; findings below may reference images not displayed]

ACR Breast Density Category b: There are scattered areas of
fibroglandular density.
FINDINGS: There are no findings suspicious for malignancy.
IMPRESSION: No mammographic evidence of malignancy. A result letter of this
screening mammogram will be mailed directly to the patient.

RECOMMENDATION:
Screening mammogram in one year. (Code:51-O-LD2)

BI-RADS CATEGORY  1: Negative.

## 2022-08-19 DIAGNOSIS — M25562 Pain in left knee: Secondary | ICD-10-CM | POA: Diagnosis not present

## 2022-08-21 DIAGNOSIS — M25562 Pain in left knee: Secondary | ICD-10-CM | POA: Diagnosis not present

## 2022-08-26 DIAGNOSIS — M25562 Pain in left knee: Secondary | ICD-10-CM | POA: Diagnosis not present

## 2022-08-28 DIAGNOSIS — M25562 Pain in left knee: Secondary | ICD-10-CM | POA: Diagnosis not present

## 2022-09-02 DIAGNOSIS — M25562 Pain in left knee: Secondary | ICD-10-CM | POA: Diagnosis not present

## 2022-09-04 DIAGNOSIS — M25562 Pain in left knee: Secondary | ICD-10-CM | POA: Diagnosis not present

## 2022-09-06 DIAGNOSIS — Z23 Encounter for immunization: Secondary | ICD-10-CM | POA: Diagnosis not present

## 2022-09-08 DIAGNOSIS — L820 Inflamed seborrheic keratosis: Secondary | ICD-10-CM | POA: Diagnosis not present

## 2022-09-08 DIAGNOSIS — D2261 Melanocytic nevi of right upper limb, including shoulder: Secondary | ICD-10-CM | POA: Diagnosis not present

## 2022-09-08 DIAGNOSIS — D2262 Melanocytic nevi of left upper limb, including shoulder: Secondary | ICD-10-CM | POA: Diagnosis not present

## 2022-09-08 DIAGNOSIS — D1801 Hemangioma of skin and subcutaneous tissue: Secondary | ICD-10-CM | POA: Diagnosis not present

## 2022-09-08 DIAGNOSIS — L821 Other seborrheic keratosis: Secondary | ICD-10-CM | POA: Diagnosis not present

## 2022-09-09 DIAGNOSIS — M25562 Pain in left knee: Secondary | ICD-10-CM | POA: Diagnosis not present

## 2022-09-11 DIAGNOSIS — M25562 Pain in left knee: Secondary | ICD-10-CM | POA: Diagnosis not present

## 2022-09-15 DIAGNOSIS — M25562 Pain in left knee: Secondary | ICD-10-CM | POA: Diagnosis not present

## 2022-09-17 DIAGNOSIS — M25562 Pain in left knee: Secondary | ICD-10-CM | POA: Diagnosis not present

## 2022-09-22 ENCOUNTER — Encounter (HOSPITAL_COMMUNITY): Payer: Medicare Other

## 2022-09-24 DIAGNOSIS — Z4789 Encounter for other orthopedic aftercare: Secondary | ICD-10-CM | POA: Diagnosis not present

## 2022-09-24 DIAGNOSIS — M25522 Pain in left elbow: Secondary | ICD-10-CM | POA: Diagnosis not present

## 2022-09-25 ENCOUNTER — Encounter (HOSPITAL_BASED_OUTPATIENT_CLINIC_OR_DEPARTMENT_OTHER): Payer: Self-pay

## 2022-09-25 ENCOUNTER — Encounter: Payer: Self-pay | Admitting: Cardiology

## 2022-09-25 NOTE — Telephone Encounter (Signed)
Please advise 

## 2022-10-01 DIAGNOSIS — N952 Postmenopausal atrophic vaginitis: Secondary | ICD-10-CM | POA: Diagnosis not present

## 2022-10-01 DIAGNOSIS — L900 Lichen sclerosus et atrophicus: Secondary | ICD-10-CM | POA: Diagnosis not present

## 2022-10-01 DIAGNOSIS — R87610 Atypical squamous cells of undetermined significance on cytologic smear of cervix (ASC-US): Secondary | ICD-10-CM | POA: Diagnosis not present

## 2022-10-01 DIAGNOSIS — Z124 Encounter for screening for malignant neoplasm of cervix: Secondary | ICD-10-CM | POA: Diagnosis not present

## 2022-10-01 DIAGNOSIS — R87615 Unsatisfactory cytologic smear of cervix: Secondary | ICD-10-CM | POA: Diagnosis not present

## 2022-10-01 DIAGNOSIS — Z01411 Encounter for gynecological examination (general) (routine) with abnormal findings: Secondary | ICD-10-CM | POA: Diagnosis not present

## 2022-10-01 DIAGNOSIS — Z01419 Encounter for gynecological examination (general) (routine) without abnormal findings: Secondary | ICD-10-CM | POA: Diagnosis not present

## 2022-10-02 ENCOUNTER — Encounter (HOSPITAL_COMMUNITY): Payer: Medicare Other

## 2022-10-06 DIAGNOSIS — G5622 Lesion of ulnar nerve, left upper limb: Secondary | ICD-10-CM | POA: Diagnosis not present

## 2022-10-13 DIAGNOSIS — R200 Anesthesia of skin: Secondary | ICD-10-CM | POA: Diagnosis not present

## 2022-10-15 ENCOUNTER — Other Ambulatory Visit (HOSPITAL_COMMUNITY): Payer: Self-pay | Admitting: Surgery

## 2022-10-15 DIAGNOSIS — E213 Hyperparathyroidism, unspecified: Secondary | ICD-10-CM

## 2022-10-23 ENCOUNTER — Encounter (HOSPITAL_COMMUNITY): Payer: Medicare Other

## 2022-10-27 DIAGNOSIS — G609 Hereditary and idiopathic neuropathy, unspecified: Secondary | ICD-10-CM | POA: Diagnosis not present

## 2022-10-27 DIAGNOSIS — G5602 Carpal tunnel syndrome, left upper limb: Secondary | ICD-10-CM | POA: Diagnosis not present

## 2022-10-28 DIAGNOSIS — M6281 Muscle weakness (generalized): Secondary | ICD-10-CM | POA: Diagnosis not present

## 2022-10-30 DIAGNOSIS — G609 Hereditary and idiopathic neuropathy, unspecified: Secondary | ICD-10-CM | POA: Diagnosis not present

## 2022-10-30 DIAGNOSIS — G5601 Carpal tunnel syndrome, right upper limb: Secondary | ICD-10-CM | POA: Diagnosis not present

## 2022-10-30 DIAGNOSIS — M6281 Muscle weakness (generalized): Secondary | ICD-10-CM | POA: Diagnosis not present

## 2022-10-31 ENCOUNTER — Encounter (HOSPITAL_COMMUNITY)
Admission: RE | Admit: 2022-10-31 | Discharge: 2022-10-31 | Disposition: A | Discharge status: Home / Self Care | Payer: Medicare Other | Source: Ambulatory Visit | Attending: Surgery | Admitting: Surgery

## 2022-10-31 ENCOUNTER — Ambulatory Visit (HOSPITAL_COMMUNITY)
Admission: RE | Admit: 2022-10-31 | Discharge: 2022-10-31 | Disposition: A | Discharge status: Home / Self Care | Payer: Medicare Other | Source: Ambulatory Visit | Attending: Surgery | Admitting: Surgery

## 2022-10-31 DIAGNOSIS — E213 Hyperparathyroidism, unspecified: Secondary | ICD-10-CM | POA: Insufficient documentation

## 2022-10-31 DIAGNOSIS — E042 Nontoxic multinodular goiter: Secondary | ICD-10-CM | POA: Diagnosis not present

## 2022-10-31 MED ORDER — TECHNETIUM TC 99M SESTAMIBI GENERIC - CARDIOLITE
25.7000 | Freq: Once | INTRAVENOUS | Status: AC | PRN
  Administered 2022-10-31: 25.7 via INTRAVENOUS

## 2022-11-03 NOTE — Progress Notes (Signed)
USN shows a possible candidate for a parathyroid adenoma, but the nuclear scan (sestamibi) is negative.  Claiborne Billings - please arrange for a 4D-CT scan of the neck to look for parathyroid adenoma.  [place], MD [facility] [place] practice Office: [phone]

## 2022-11-04 DIAGNOSIS — M6281 Muscle weakness (generalized): Secondary | ICD-10-CM | POA: Diagnosis not present

## 2022-11-05 ENCOUNTER — Other Ambulatory Visit: Payer: Self-pay | Admitting: Surgery

## 2022-11-05 DIAGNOSIS — E213 Hyperparathyroidism, unspecified: Secondary | ICD-10-CM

## 2022-11-06 DIAGNOSIS — M6281 Muscle weakness (generalized): Secondary | ICD-10-CM | POA: Diagnosis not present

## 2022-11-11 DIAGNOSIS — M6281 Muscle weakness (generalized): Secondary | ICD-10-CM | POA: Diagnosis not present

## 2022-11-13 DIAGNOSIS — M6281 Muscle weakness (generalized): Secondary | ICD-10-CM | POA: Diagnosis not present

## 2022-11-18 DIAGNOSIS — M6281 Muscle weakness (generalized): Secondary | ICD-10-CM | POA: Diagnosis not present

## 2022-11-20 DIAGNOSIS — M6281 Muscle weakness (generalized): Secondary | ICD-10-CM | POA: Diagnosis not present

## 2022-11-25 ENCOUNTER — Other Ambulatory Visit: Payer: Self-pay | Admitting: Obstetrics and Gynecology

## 2022-11-25 DIAGNOSIS — M6281 Muscle weakness (generalized): Secondary | ICD-10-CM | POA: Diagnosis not present

## 2022-11-25 DIAGNOSIS — Z1231 Encounter for screening mammogram for malignant neoplasm of breast: Secondary | ICD-10-CM

## 2022-11-26 ENCOUNTER — Telehealth: Payer: Self-pay | Admitting: Cardiology

## 2022-11-26 NOTE — Telephone Encounter (Signed)
Need to clarify which antibiotic and dose she's referencing, she doesn't have any on her med list. I don't see that she even has an indication to take SBE ppx, who is prescribing her an antibiotic? Usually SBE ppx dose is a higher single dose though, like amoxicillin 2g.  Yes she can get the RSV vaccine.

## 2022-11-26 NOTE — Telephone Encounter (Signed)
Pt would like a callback regarding Antibiotic medication that she has to take before dental appt. Pt is concerned about dosage being too much. Pt also would like to know if she is okay to take RSV vaccine.Please advise

## 2022-11-26 NOTE — Telephone Encounter (Signed)
Returned call to patient,   Patient states that her primary care provided her with an antibiotic, amoxicillin 2g 30 minutes prior to dental procedure. She states that she had a knee replacement and the dental office would not do her cleaning without the abx. She is hesitant to do so because she read that it could send her back to into A. Fib and she states that she is doing so well since her ablation she doesn't want to take any chances.     "Need to clarify which antibiotic and dose she's referencing, she doesn't have any on her med list. I don't see that she even has an indication to take SBE ppx, who is prescribing her an antibiotic? Usually SBE ppx dose is a higher single dose though, like amoxicillin 2g.   Yes she can get the RSV vaccine."

## 2022-11-26 NOTE — Telephone Encounter (Signed)
Knee replacement isn't an indication for SBE ppx for dental procedures. PCP office did send in the correct abx dose but she doesn't require SBE ppx for dental work. Amoxicillin would not send her into afib though.

## 2022-11-26 NOTE — Telephone Encounter (Signed)
Returned call to patient and provided the following recommendations to the patient, who verbalizes understanding.    "Knee replacement isn't an indication for SBE ppx for dental procedures. PCP office did send in the correct abx dose but she doesn't require SBE ppx for dental work. Amoxicillin would not send her into afib though."

## 2022-11-26 NOTE — Telephone Encounter (Signed)
Please advise 

## 2022-11-27 DIAGNOSIS — M6281 Muscle weakness (generalized): Secondary | ICD-10-CM | POA: Diagnosis not present

## 2022-12-01 ENCOUNTER — Ambulatory Visit
Admission: RE | Admit: 2022-12-01 | Discharge: 2022-12-01 | Disposition: A | Discharge status: Home / Self Care | Payer: Medicare Other | Source: Ambulatory Visit | Attending: Surgery | Admitting: Surgery

## 2022-12-01 DIAGNOSIS — M47812 Spondylosis without myelopathy or radiculopathy, cervical region: Secondary | ICD-10-CM | POA: Diagnosis not present

## 2022-12-01 DIAGNOSIS — E041 Nontoxic single thyroid nodule: Secondary | ICD-10-CM | POA: Diagnosis not present

## 2022-12-01 DIAGNOSIS — E213 Hyperparathyroidism, unspecified: Secondary | ICD-10-CM | POA: Diagnosis not present

## 2022-12-01 MED ORDER — IOPAMIDOL (ISOVUE-300) INJECTION 61%
75.0000 mL | Freq: Once | INTRAVENOUS | Status: AC | PRN
  Administered 2022-12-01: 75 mL via INTRAVENOUS

## 2022-12-03 DIAGNOSIS — M6281 Muscle weakness (generalized): Secondary | ICD-10-CM | POA: Diagnosis not present

## 2022-12-04 DIAGNOSIS — M6281 Muscle weakness (generalized): Secondary | ICD-10-CM | POA: Diagnosis not present

## 2022-12-09 DIAGNOSIS — M6281 Muscle weakness (generalized): Secondary | ICD-10-CM | POA: Diagnosis not present

## 2022-12-10 NOTE — Progress Notes (Signed)
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  I would like to see patient back in office (last visit August 2023) to review results of all of her studies and discuss possible parathyroid surgery.  Claiborne Billings - please arrange a follow up visit with me.  tmg  Armandina Gemma, [place] [facility] [place] practice Office: [phone]

## 2022-12-11 DIAGNOSIS — I480 Paroxysmal atrial fibrillation: Secondary | ICD-10-CM | POA: Diagnosis not present

## 2022-12-11 DIAGNOSIS — D6869 Other thrombophilia: Secondary | ICD-10-CM | POA: Diagnosis not present

## 2022-12-11 DIAGNOSIS — R030 Elevated blood-pressure reading, without diagnosis of hypertension: Secondary | ICD-10-CM | POA: Diagnosis not present

## 2022-12-11 DIAGNOSIS — E041 Nontoxic single thyroid nodule: Secondary | ICD-10-CM | POA: Diagnosis not present

## 2022-12-11 DIAGNOSIS — R7989 Other specified abnormal findings of blood chemistry: Secondary | ICD-10-CM | POA: Diagnosis not present

## 2022-12-11 DIAGNOSIS — M6281 Muscle weakness (generalized): Secondary | ICD-10-CM | POA: Diagnosis not present

## 2022-12-15 DIAGNOSIS — M6281 Muscle weakness (generalized): Secondary | ICD-10-CM | POA: Diagnosis not present

## 2022-12-15 DIAGNOSIS — Z1212 Encounter for screening for malignant neoplasm of rectum: Secondary | ICD-10-CM | POA: Diagnosis not present

## 2022-12-18 DIAGNOSIS — M6281 Muscle weakness (generalized): Secondary | ICD-10-CM | POA: Diagnosis not present

## 2022-12-18 DIAGNOSIS — Z0000 Encounter for general adult medical examination without abnormal findings: Secondary | ICD-10-CM | POA: Diagnosis not present

## 2022-12-18 DIAGNOSIS — K5790 Diverticulosis of intestine, part unspecified, without perforation or abscess without bleeding: Secondary | ICD-10-CM | POA: Diagnosis not present

## 2022-12-18 DIAGNOSIS — J45909 Unspecified asthma, uncomplicated: Secondary | ICD-10-CM | POA: Diagnosis not present

## 2022-12-18 DIAGNOSIS — R3121 Asymptomatic microscopic hematuria: Secondary | ICD-10-CM | POA: Diagnosis not present

## 2022-12-18 DIAGNOSIS — I444 Left anterior fascicular block: Secondary | ICD-10-CM | POA: Diagnosis not present

## 2022-12-18 DIAGNOSIS — G4733 Obstructive sleep apnea (adult) (pediatric): Secondary | ICD-10-CM | POA: Diagnosis not present

## 2022-12-18 DIAGNOSIS — Z23 Encounter for immunization: Secondary | ICD-10-CM | POA: Diagnosis not present

## 2022-12-18 DIAGNOSIS — R030 Elevated blood-pressure reading, without diagnosis of hypertension: Secondary | ICD-10-CM | POA: Diagnosis not present

## 2022-12-18 DIAGNOSIS — I493 Ventricular premature depolarization: Secondary | ICD-10-CM | POA: Diagnosis not present

## 2022-12-18 DIAGNOSIS — I480 Paroxysmal atrial fibrillation: Secondary | ICD-10-CM | POA: Diagnosis not present

## 2022-12-18 DIAGNOSIS — M8580 Other specified disorders of bone density and structure, unspecified site: Secondary | ICD-10-CM | POA: Diagnosis not present

## 2022-12-18 DIAGNOSIS — I6389 Other cerebral infarction: Secondary | ICD-10-CM | POA: Diagnosis not present

## 2022-12-18 DIAGNOSIS — R82998 Other abnormal findings in urine: Secondary | ICD-10-CM | POA: Diagnosis not present

## 2022-12-23 DIAGNOSIS — E210 Primary hyperparathyroidism: Secondary | ICD-10-CM | POA: Diagnosis not present

## 2022-12-23 DIAGNOSIS — M6281 Muscle weakness (generalized): Secondary | ICD-10-CM | POA: Diagnosis not present

## 2022-12-24 ENCOUNTER — Telehealth: Payer: Self-pay

## 2022-12-24 NOTE — Telephone Encounter (Signed)
  Patient Consent for Virtual Visit        Kristin Lowe has provided verbal consent on 12/24/2022 for a virtual visit (video or telephone).   CONSENT FOR VIRTUAL VISIT FOR:  Kristin Lowe  By participating in this virtual visit I agree to the following:  I hereby voluntarily request, consent and authorize [facility] and its employed or contracted physicians, physician assistants, nurse practitioners or other licensed health care professionals (the Practitioner), to provide me with telemedicine health care services (the "Services") as deemed necessary by the treating Practitioner. I acknowledge and consent to receive the Services by the Practitioner via telemedicine. I understand that the telemedicine visit will involve communicating with the Practitioner through live audiovisual communication technology and the disclosure of certain medical information by electronic transmission. I acknowledge that I have been given the opportunity to request an in-person assessment or other available alternative prior to the telemedicine visit and am voluntarily participating in the telemedicine visit.  I understand that I have the right to withhold or withdraw my consent to the use of telemedicine in the course of my care at any time, without affecting my right to future care or treatment, and that the Practitioner or I may terminate the telemedicine visit at any time. I understand that I have the right to inspect all information obtained and/or recorded in the course of the telemedicine visit and may receive copies of available information for a reasonable fee.  I understand that some of the potential risks of receiving the Services via telemedicine include:  Delay or interruption in medical evaluation due to technological equipment failure or disruption; Information transmitted may not be sufficient (e.g. poor resolution of images) to allow for appropriate medical decision making by the  Practitioner; and/or  In rare instances, security protocols could fail, causing a breach of personal health information.  Furthermore, I acknowledge that it is my responsibility to provide information about my medical history, conditions and care that is complete and accurate to the best of my ability. I acknowledge that Practitioner's advice, recommendations, and/or decision may be based on factors not within their control, such as incomplete or inaccurate data provided by me or distortions of diagnostic images or specimens that may result from electronic transmissions. I understand that the practice of medicine is not an exact science and that Practitioner makes no warranties or guarantees regarding treatment outcomes. I acknowledge that a copy of this consent can be made available to me via my patient portal ([name]), or I can request a printed copy by calling the office of [facility].    I understand that my insurance will be billed for this visit.   I have read or had this consent read to me. I understand the contents of this consent, which adequately explains the benefits and risks of the Services being provided via telemedicine.  I have been provided ample opportunity to ask questions regarding this consent and the Services and have had my questions answered to my satisfaction. I give my informed consent for the services to be provided through the use of telemedicine in my medical care

## 2022-12-24 NOTE — Telephone Encounter (Signed)
Patient with diagnosis of afib on Eliquis for anticoagulation.    Procedure: parathyroid surgery Date of procedure: TBD  CHA2DS2-VASc Score = 7  This indicates a 11.2% annual risk of stroke. The patient's score is based upon: CHF History: 0 HTN History: 1 Diabetes History: 0 Stroke History: 2 Vascular Disease History: 1 Age Score: 2 Gender Score: 1  CrCl 76m/min using adjusted body weight Platelet count 233K  Per office protocol, patient can hold Eliquis for 2 days prior to procedure. Resume as soon as safely possible after given elevated CV risk.  **This guidance is not considered finalized until pre-operative APP has relayed final recommendations.**

## 2022-12-24 NOTE — Telephone Encounter (Signed)
Spoke with patient who is agreeable to do a tele visit on 2/9 at 9:40 am. Patient states that if she doesn't answer, leave a voicemail. Consent given and med rec needs to be completed.

## 2022-12-24 NOTE — Telephone Encounter (Signed)
Primary [place], MD   Preoperative team, please contact this patient and set up a phone call appointment for further preoperative risk assessment. Please obtain consent and complete medication review. Thank you for your help.   I confirm that guidance regarding antiplatelet and oral anticoagulation therapy has been completed and, if necessary, noted below.  Emmaline Life, NP-C  12/24/2022, 2:52 PM [address] Office [phone] Fax [phone]

## 2022-12-24 NOTE — Telephone Encounter (Signed)
   Pre-operative Risk Assessment    Patient Name: Kristin Lowe  DOB: 1941-01-04 MRN: [id number]      Request for Surgical Clearance    Procedure:   PARATHYROID SURGERY  Date of Surgery:  Clearance TBD                                 Surgeon:  DR. TODD Harlow Asa Surgeon's Group or Practice Name:  [facility] Phone number:  [phone] Fax number:  [phone] ATTN: Mammie Lorenzo, LPN   Type of Clearance Requested:   - Pharmacy:  Hold Apixaban (Eliquis) PLEASE INSTRUCTION WHEN TO HOLD   Type of Anesthesia:  General    Additional requests/questions:    SignedJacinta Shoe   12/24/2022, 11:43 AM

## 2022-12-25 DIAGNOSIS — M6281 Muscle weakness (generalized): Secondary | ICD-10-CM | POA: Diagnosis not present

## 2022-12-31 DIAGNOSIS — Z4789 Encounter for other orthopedic aftercare: Secondary | ICD-10-CM | POA: Diagnosis not present

## 2023-01-01 ENCOUNTER — Encounter (HOSPITAL_COMMUNITY): Payer: Self-pay | Admitting: *Deleted

## 2023-01-01 DIAGNOSIS — M6281 Muscle weakness (generalized): Secondary | ICD-10-CM | POA: Diagnosis not present

## 2023-01-01 NOTE — Progress Notes (Addendum)
Virtual Visit via Telephone Note   Because of Kristin Lowe's co-morbid illnesses, she is at least at moderate risk for complications without adequate follow up.  This format is felt to be most appropriate for this patient at this time.  The patient did not have access to video technology/had technical difficulties with video requiring transitioning to audio format only (telephone).  All issues noted in this document were discussed and addressed.  No physical exam could be performed with this format.  Please refer to the patient's chart for her consent to telehealth for Kristin Lowe.  Evaluation Performed:  Preoperative cardiovascular risk assessment _____________   Date:  01/01/2023   Patient ID:  Kristin Lowe, Kristin Lowe 1941/03/03, MRN [id number] Patient Location:  Home Provider location:   Office  Primary Care Provider:  [name], MD Primary Cardiologist:  Buford Dresser, MD  Chief Complaint / Patient Profile   82 y.o. y/o female with a h/o cryptogenic stroke, HTN, sinus bradycardia, paroxysmal AF who is pending parathyroid surgery and presents today for telephonic preoperative cardiovascular risk assessment.  History of Present Illness    Kristin Lowe is a 82 y.o. female who presents via audio/video conferencing for a telehealth visit today.  Pt was last seen in cardiology clinic on 05/06/2022 by Dr. Harrell Gave.  At that time Kristin Lowe was doing well and during visit denied chest pain, shortness of breath, or palpitations..  The patient is now pending procedure as outlined above. Since her last visit, she is doing well with no new cardiac complaints.  She is currently exercising 3 times per week at the [facility] and averaging 8000-10,000 steps per day.  She does have a nagging post knee replacement injury that she is currently taking prednisone for.  She is otherwise doing well at this time.  She denies chest pain, shortness of breath, lower extremity  edema, fatigue, palpitations, melena, hematuria, hemoptysis, diaphoresis, weakness, presyncope, syncope, orthopnea, and PND.    Per office protocol, patient can hold Eliquis for 2 days prior to procedure. Resume as soon as safely possible after given elevated CV risk.   Past Medical History    Past Medical History:  Diagnosis Date   Arthritis    Bradycardia    Cancer ([place])    skin cancer   History of kidney stones    Hypertension    Paroxysmal atrial fibrillation (HCC)    Pre-diabetes    Stroke (cerebrum) [facility])    Past Surgical History:  Procedure Laterality Date   ANAL RECTAL MANOMETRY N/A 04/16/2022   Procedure: ANO RECTAL MANOMETRY;  Surgeon: Thornton Park, MD;  Location: [facility];  Service: Gastroenterology;  Laterality: N/A;   ATRIAL FIBRILLATION ABLATION N/A 09/17/2021   Procedure: ATRIAL FIBRILLATION ABLATION;  Surgeon: Vickie Epley, MD;  Location: [place] CV LAB;  Service: Cardiovascular;  Laterality: N/A;   CATARACT EXTRACTION, BILATERAL     LOOP RECORDER INSERTION N/A 05/17/2018   Procedure: LOOP RECORDER INSERTION;  Surgeon: Deboraha Sprang, MD;  Location: [place] CV LAB;  Service: Cardiovascular;  Laterality: N/A;   LOOP RECORDER INSERTION  05/17/2018   Procedure: LOOP RECORDER INSERTION;  Surgeon: Fay Records, MD;  Location: [place];  Service: Cardiovascular;;   TEE WITHOUT CARDIOVERSION N/A 05/17/2018   Procedure: TRANSESOPHAGEAL ECHOCARDIOGRAM (TEE);  Surgeon: Fay Records, MD;  Location: [place];  Service: Cardiovascular;  Laterality: N/A;   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   Procedure: TOTAL KNEE ARTHROPLASTY;  Surgeon: Theda Sers,  Herbie Baltimore, MD;  Location: [facility];  Service: Orthopedics;  Laterality: Left;  with adductor canal   TRIGGER FINGER RELEASE     TUMOR REMOVAL Right    Right hand ganglion removed    Allergies  No Known Allergies  Home Medications    Prior to Admission medications   Medication Sig Start Date End  Date Taking? Authorizing Provider  apixaban (ELIQUIS) 5 MG TABS tablet Take 1 tablet (5 mg total) by mouth 2 (two) times daily. 02/17/22   Vickie Epley, MD  Ascorbic Acid (VITAMIN C) 1000 MG tablet Take 1,000 mg by mouth 4 (four) times a week.    [provider]  atorvastatin (LIPITOR) 40 MG tablet Take 1 tablet (40 mg total) by mouth daily at 6 PM. 05/17/18   Danford, Suann Larry, MD  bisacodyl (DULCOLAX) 5 MG EC tablet Take 1 tablet (5 mg total) by mouth daily as needed for moderate constipation. 07/03/22   Drue Novel, PA  cholecalciferol (VITAMIN D3) 25 MCG (1000 UNIT) tablet Take 1,000 Units by mouth daily.    [provider]  clobetasol ointment (TEMOVATE) [id number] % Apply 1 application topically daily as needed (skin irritation).    [provider]  Coenzyme Q10 (COQ-10) 100 MG CAPS Take 100 mg by mouth daily.    [provider]  Coral Calcium 500 MG CAPS Take 500 mg by mouth daily.    [provider]  Magnesium 250 MG TABS Take 250 mg by mouth daily.    [provider]  methocarbamol (ROBAXIN) 500 MG tablet Take 1 tablet (500 mg total) by mouth every 6 (six) hours as needed for muscle spasms. 07/03/22   Haus, Cordelia Pen, PA  ondansetron (ZOFRAN) 4 MG tablet Take 1 tablet (4 mg total) by mouth every 6 (six) hours as needed for nausea. 07/03/22   Elizabeth Sauer R, PA  oxyCODONE (OXY IR/ROXICODONE) 5 MG immediate release tablet Take 1-2 tablets (5-10 mg total) by mouth every 4 (four) hours as needed for moderate pain or severe pain (pain score 4-6). 07/03/22   Haus, Cordelia Pen, PA  traMADol (ULTRAM) 50 MG tablet Take 1 tablet (50 mg total) by mouth every 6 (six) hours as needed for moderate pain. 07/03/22   Haus, Cordelia Pen, PA  triamcinolone (NASACORT ALLERGY 24HR) 55 MCG/ACT AERO nasal inhaler Place 2 sprays into the nose daily as needed (allergies).    [provider]  TURMERIC PO Take 1,000 mg by mouth daily.    [provider]    Physical Exam    Vital Signs:  Kristin Lowe does not have vital signs available for review today.  Given telephonic nature of communication, physical exam is limited. AAOx3. NAD. Normal affect.  Speech and respirations are unlabored.  Accessory Clinical Findings    None  Assessment & Plan    1.  Preoperative Cardiovascular Risk Assessment:  The patient affirms she has been doing well without any new cardiac symptoms. They are able to achieve 6 METS without cardiac limitations. Therefore, based on ACC/AHA guidelines, the patient would be at acceptable risk for the planned procedure without further cardiovascular testing. The patient was advised that if she develops new symptoms prior to surgery to contact our office to arrange for a follow-up visit, and she verbalized understanding.   Kristin Lowe perioperative risk of a major cardiac event is 6.6% according to the Revised Cardiac Risk Index (RCRI).  Therefore, she is at high risk for perioperative complications.  Her functional capacity is good at 6.05 METs according to the Duke Activity Status Index (DASI). Recommendations: According to ACC/AHA guidelines, no further cardiovascular testing needed.  The patient may proceed to surgery at acceptable risk.   Antiplatelet and/or Anticoagulation Recommendations:   Eliquis (Apixaban) can be held for 2 days prior to surgery.  Please resume post op when felt to be safe.      The patient was advised that if she develops new symptoms prior to surgery to contact our office to arrange for a follow-up visit, and she verbalized understanding.   Time:   Today, I have spent 10 minutes with the patient with telehealth technology discussing medical history, symptoms, and management plan.     Mable Fill, Marissa Nestle, NP  01/01/2023, 11:34 AM

## 2023-01-02 ENCOUNTER — Ambulatory Visit: Payer: Medicare Other | Attending: Cardiology

## 2023-01-02 DIAGNOSIS — Z01810 Encounter for preprocedural cardiovascular examination: Secondary | ICD-10-CM

## 2023-01-06 DIAGNOSIS — M6281 Muscle weakness (generalized): Secondary | ICD-10-CM | POA: Diagnosis not present

## 2023-01-08 DIAGNOSIS — G5602 Carpal tunnel syndrome, left upper limb: Secondary | ICD-10-CM | POA: Diagnosis not present

## 2023-01-08 DIAGNOSIS — M6281 Muscle weakness (generalized): Secondary | ICD-10-CM | POA: Diagnosis not present

## 2023-01-13 DIAGNOSIS — H5000 Unspecified esotropia: Secondary | ICD-10-CM | POA: Diagnosis not present

## 2023-01-13 DIAGNOSIS — Z961 Presence of intraocular lens: Secondary | ICD-10-CM | POA: Diagnosis not present

## 2023-01-13 DIAGNOSIS — H524 Presbyopia: Secondary | ICD-10-CM | POA: Diagnosis not present

## 2023-01-13 DIAGNOSIS — H04123 Dry eye syndrome of bilateral lacrimal glands: Secondary | ICD-10-CM | POA: Diagnosis not present

## 2023-01-14 ENCOUNTER — Ambulatory Visit
Admission: RE | Admit: 2023-01-14 | Discharge: 2023-01-14 | Disposition: A | Discharge status: Home / Self Care | Payer: Medicare Other | Source: Ambulatory Visit | Attending: Obstetrics and Gynecology | Admitting: Obstetrics and Gynecology

## 2023-01-14 DIAGNOSIS — Z1231 Encounter for screening mammogram for malignant neoplasm of breast: Secondary | ICD-10-CM | POA: Diagnosis not present

## 2023-01-15 DIAGNOSIS — M6281 Muscle weakness (generalized): Secondary | ICD-10-CM | POA: Diagnosis not present

## 2023-01-19 DIAGNOSIS — M6281 Muscle weakness (generalized): Secondary | ICD-10-CM | POA: Diagnosis not present

## 2023-01-20 ENCOUNTER — Telehealth: Payer: Self-pay

## 2023-01-20 NOTE — Telephone Encounter (Signed)
Application has been signed and faxed.

## 2023-01-20 NOTE — Telephone Encounter (Signed)
**Note De-Identified Kristin Lowe Obfuscation** Te pts completed [employer] application for Eliquis assistance was left at the office with documents.  I have completed the providers page of her application and have e-mailed all to Dr Mardene Speak nurse so she can obtain his signature, date it, and to then fax all to [employer] at the fax number written on the cover letter included.

## 2023-01-28 NOTE — Telephone Encounter (Signed)
Spoke with the patient and advised I will mail her a copy of her forms.

## 2023-01-28 NOTE — Telephone Encounter (Signed)
Pt is requesting call back to discuss if she can come by and pick-up the form that was faxed. Please advise.

## 2023-01-29 DIAGNOSIS — M6281 Muscle weakness (generalized): Secondary | ICD-10-CM | POA: Diagnosis not present

## 2023-02-03 ENCOUNTER — Ambulatory Visit: Payer: Self-pay | Admitting: Surgery

## 2023-02-03 DIAGNOSIS — R0981 Nasal congestion: Secondary | ICD-10-CM | POA: Diagnosis not present

## 2023-02-03 DIAGNOSIS — J40 Bronchitis, not specified as acute or chronic: Secondary | ICD-10-CM | POA: Diagnosis not present

## 2023-02-03 DIAGNOSIS — J069 Acute upper respiratory infection, unspecified: Secondary | ICD-10-CM | POA: Diagnosis not present

## 2023-02-03 DIAGNOSIS — R5383 Other fatigue: Secondary | ICD-10-CM | POA: Diagnosis not present

## 2023-02-03 DIAGNOSIS — J101 Influenza due to other identified influenza virus with other respiratory manifestations: Secondary | ICD-10-CM | POA: Diagnosis not present

## 2023-02-03 DIAGNOSIS — R42 Dizziness and giddiness: Secondary | ICD-10-CM | POA: Diagnosis not present

## 2023-02-03 DIAGNOSIS — R051 Acute cough: Secondary | ICD-10-CM | POA: Diagnosis not present

## 2023-02-03 DIAGNOSIS — Z1152 Encounter for screening for COVID-19: Secondary | ICD-10-CM | POA: Diagnosis not present

## 2023-02-03 NOTE — Progress Notes (Signed)
Surgery orders requested via Epic inbox. °

## 2023-02-03 NOTE — H&P (Signed)
PROVIDER: Xadrian Craighead Charlotta Newton, MD   Chief Complaint: Follow-up (Primary hyperparathyroidism)  History of Present Illness:  Patient returns for surgical follow-up and to review the results of testing for evaluation of suspected primary hyperparathyroidism and hypercalcemia. Patient underwent an ultrasound examination in early December 2023. This demonstrated a 7 mm oval hypoechoic nodule along the inferior aspect of the left thyroid lobe. This was felt to represent a left inferior parathyroid gland. However, nuclear medicine sestamibi scan failed to localize a parathyroid adenoma. Therefore the patient underwent a 4D CT scan of the neck with parathyroid protocol on December 01, 2022. 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 Laboratory studies showed a PTH level of 46 which is unsuppressed given her hypercalcemia. Patient does have osteoporosis and has been discussing treatment options with her primary care physician, Dr. [name]. Patient has a history of atrial fibrillation and is currently on Eliquis. She will require cardiac clearance prior to scheduling any surgical intervention.  Review of Systems: A complete review of systems was obtained from the patient. I have reviewed this information and discussed as appropriate with the patient. See HPI as well for other ROS.  Review of Systems Constitutional: Negative. HENT: Negative. Eyes: Negative. Respiratory: Negative. Cardiovascular: Positive for palpitations. Gastrointestinal: Negative. Genitourinary: Negative. Musculoskeletal: Negative. Skin: Negative. Neurological: Negative. Endo/Heme/Allergies: Negative. Psychiatric/Behavioral: Negative.   Medical History: Past Medical History: Diagnosis Date Arthritis DVT (deep venous thrombosis) (CMS-HCC) History of stroke Sleep apnea  Patient Active Problem List Diagnosis Hypercalcemia Primary  hyperparathyroidism (CMS-HCC)  Past Surgical History: Procedure Laterality Date JOINT REPLACEMENT   No Known Allergies  Current Outpatient Medications on File Prior to Visit Medication Sig Dispense Refill apixaban (ELIQUIS) 5 mg tablet as directed atorvastatin (LIPITOR) 40 MG tablet Take 1 tablet by mouth once daily cholecalciferol (VITAMIN D3) 1000 unit tablet Take by mouth clobetasoL (TEMOVATE) [id number] % cream 1 application Externally Twice a day co-enzyme Q-10, ubiquinone, 50 mg capsule Take by mouth DULCOLAX, BISACODYL, 5 mg EC tablet 1 tablet as needed Orally Once a day magnesium 250 mg Tab Take by mouth oxyCODONE (ROXICODONE) 5 MG immediate release tablet TAKE 1 TABLET BY MOUTH EVERY 4 HOURS traMADoL (ULTRAM) 50 mg tablet triamcinolone (NASACORT AQ) 55 mcg nasal spray Place into one nostril ZOFRAN 4 mg tablet Take 1 tablet twice a day by oral route.  No current facility-administered medications on file prior to visit.  Family History Problem Relation Age of Onset Obesity Mother Diabetes Mother Breast cancer Mother Hyperlipidemia (Elevated cholesterol) Father High blood pressure (Hypertension) Sister Hyperlipidemia (Elevated cholesterol) Sister Diabetes Brother Hyperlipidemia (Elevated cholesterol) Brother   Social History  Tobacco Use Smoking Status Former Types: Cigarettes Smokeless Tobacco Never   Social History  Socioeconomic History Marital status: Single Tobacco Use Smoking status: Former Types: Cigarettes Smokeless tobacco: Never Vaping Use Vaping Use: Never used Substance and Sexual Activity Alcohol use: Yes Drug use: Never  Objective:  Physical Exam  GENERAL APPEARANCE Comfortable, no acute issues Development: normal Gross deformities: none  SKIN Rash, lesions, ulcers: none Induration, erythema: none Nodules: none palpable  EYES Conjunctiva and lids: normal Pupils: equal and reactive  EARS, NOSE, MOUTH, THROAT External ears: no  lesion or deformity External nose: no lesion or deformity Hearing: grossly normal  NECK Symmetric: yes Trachea: midline Thyroid: no palpable nodules in the thyroid bed  CHEST Respiratory effort: normal Retraction or accessory muscle use: no Breath sounds: normal bilaterally Rales, rhonchi,  wheeze: none  CARDIOVASCULAR Auscultation: irregular rhythm, normal rate Murmurs: none Pulses: radial pulse 2+ palpable Lower extremity edema: mild bilateral at ankles  ABDOMEN Not assessed  GENITOURINARY/RECTAL Not assessed  MUSCULOSKELETAL Station and gait: normal Digits and nails: no clubbing or cyanosis Muscle strength: grossly normal all extremities Range of motion: grossly normal all extremities Deformity: none Mild bilateral tremor in hands  LYMPHATIC Cervical: none palpable Supraclavicular: none palpable  PSYCHIATRIC Oriented to person, place, and time: yes Mood and affect: normal for situation Judgment and insight: appropriate for situation   Assessment and Plan:  Hypercalcemia Primary hyperparathyroidism (CMS-HCC)  Patient returns for surgical follow-up of suspected primary hyperparathyroidism. Today we reviewed the results of her ultrasound, nuclear medicine parathyroid scan, and 4D CT scan of the neck. Two of these studies indicate the presence of an abnormal parathyroid gland in the left inferior position. Laboratory studies demonstrated an unsuppressed PTH level supporting the diagnosis of primary hyperparathyroidism. Patient does have complications including significant osteoporosis.  I have recommended proceeding to surgery. My strategy would be for a minimally invasive approach to the left inferior parathyroid gland. If this proved to be an adenoma, then I believe she could have the minimally invasive procedure as an outpatient surgery. If the left inferior gland was not consistent with parathyroid adenoma, then we would need to perform a more thorough neck  exploration which might require an overnight hospital stay. Patient understands. She would like to have an outpatient procedure. She agrees to proceed with surgery as I have outlined.  We will request clearance from her cardiologist as well as guidance on perioperative management of her anticoagulation. Once this is obtained, we will contact the patient to schedule a date for her procedure.  Armandina Gemma, MD [facility] [place] [facility] Office: [phone]

## 2023-02-05 NOTE — Patient Instructions (Addendum)
SURGICAL WAITING ROOM VISITATION  Patients having surgery or a procedure may have no more than 2 support people in the waiting area - these visitors may rotate.    Children under the age of 73 must have an adult with them who is not the patient.  Due to an increase in RSV and influenza rates and associated hospitalizations, children ages 33 and under may not visit patients in [place].  If the patient needs to stay at the hospital during part of their recovery, the visitor guidelines for inpatient rooms apply. Pre-op nurse will coordinate an appropriate time for 1 support person to accompany patient in pre-op.  This support person may not rotate.    Please refer to the [facility] website for the visitor guidelines for Inpatients (after your surgery is over and you are in a regular room).       Your procedure is scheduled on:  02/19/23    Report to [facility] Main Entrance    Report to admitting at   [phone]   Call this number if you have problems the morning of surgery [phone]   Do not eat food :After Midnight.   After Midnight you may have the following liquids until ___ 0430___ AM DAY OF SURGERY  Water Non-Citrus Juices (without pulp, NO RED-Apple, White grape, White cranberry) Black Coffee (NO MILK/CREAM OR CREAMERS, sugar ok)  Clear Tea (NO MILK/CREAM OR CREAMERS, sugar ok) regular and decaf                             Plain Jell-O (NO RED)                                           Fruit ices (not with fruit pulp, NO RED)                                     Popsicles (NO RED)                                                               Sports drinks like Gatorade (NO RED)                    If you have questions, please contact your surgeon's office.       Oral Hygiene is also important to reduce your risk of infection.                                    Remember - BRUSH YOUR TEETH THE MORNING OF SURGERY WITH YOUR REGULAR  TOOTHPASTE  DENTURES WILL BE REMOVED PRIOR TO SURGERY PLEASE DO NOT APPLY "Poly grip" OR ADHESIVES!!!   Do NOT smoke after Midnight   Take these medicines the morning of surgery with A SIP OF WATER:   none   DO NOT TAKE ANY ORAL DIABETIC MEDICATIONS DAY OF YOUR SURGERY  Bring CPAP mask and tubing day of surgery.  You may not have any metal on your body including hair pins, jewelry, and body piercing             Do not wear make-up, lotions, powders, perfumes/cologne, or deodorant  Do not wear nail polish including gel and S&S, artificial/acrylic nails, or any other type of covering on natural nails including finger and toenails. If you have artificial nails, gel coating, etc. that needs to be removed by a nail salon please have this removed prior to surgery or surgery may need to be canceled/ delayed if the surgeon/ anesthesia feels like they are unable to be safely monitored.   Do not shave  48 hours prior to surgery.               Men may shave face and neck.   Do not bring valuables to the hospital. [place].   Contacts, glasses, dentures or bridgework may not be worn into surgery.   Bring small overnight bag day of surgery.   DO NOT [place]. PHARMACY WILL DISPENSE MEDICATIONS LISTED ON YOUR MEDICATION LIST TO YOU DURING YOUR ADMISSION [place]!    Patients discharged on the day of surgery will not be allowed to drive home.  Someone NEEDS to stay with you for the first 24 hours after anesthesia.   Special Instructions: Bring a copy of your healthcare power of attorney and living will documents the day of surgery if you haven't scanned them before.              Please read over the following fact sheets you were given: IF [place] [phone]   If you received a COVID test during your pre-op visit  it is  requested that you wear a mask when out in public, stay away from anyone that may not be feeling well and notify your surgeon if you develop symptoms. If you test positive for Covid or have been in contact with anyone that has tested positive in the last 10 days please notify you surgeon.    [place] - Preparing for Surgery Before surgery, you can play an important role.  Because skin is not sterile, your skin needs to be as free of germs as possible.  You can reduce the number of germs on your skin by washing with CHG (chlorahexidine gluconate) soap before surgery.  CHG is an antiseptic cleaner which kills germs and bonds with the skin to continue killing germs even after washing. Please DO NOT use if you have an allergy to CHG or antibacterial soaps.  If your skin becomes reddened/irritated stop using the CHG and inform your nurse when you arrive at Short Stay. Do not shave (including legs and underarms) for at least 48 hours prior to the first CHG shower.  You may shave your face/neck. Please follow these instructions carefully:  1.  Shower with CHG Soap the night before surgery and the  morning of Surgery.  2.  If you choose to wash your hair, wash your hair first as usual with your  normal  shampoo.  3.  After you shampoo, rinse your hair and body thoroughly to remove the  shampoo.  4.  Use CHG as you would any other liquid soap.  You can apply chg directly  to the skin and wash                       Gently with a scrungie or clean washcloth.  5.  Apply the CHG Soap to your body ONLY FROM THE NECK DOWN.   Do not use on face/ open                           Wound or open sores. Avoid contact with eyes, ears mouth and genitals (private parts).                       Wash face,  Genitals (private parts) with your normal soap.             6.  Wash thoroughly, paying special attention to the area where your surgery  will be performed.  7.  Thoroughly rinse your body with warm  water from the neck down.  8.  DO NOT shower/wash with your normal soap after using and rinsing off  the CHG Soap.                9.  Pat yourself dry with a clean towel.            10.  Wear clean pajamas.            11.  Place clean sheets on your bed the night of your first shower and do not  sleep with pets. Day of Surgery : Do not apply any lotions/deodorants the morning of surgery.  Please wear clean clothes to the hospital/surgery center.  FAILURE TO FOLLOW THESE INSTRUCTIONS MAY RESULT IN THE CANCELLATION OF YOUR SURGERY PATIENT SIGNATURE_________________________________  NURSE SIGNATURE__________________________________  ________________________________________________________________________

## 2023-02-05 NOTE — Progress Notes (Addendum)
Anesthesia Review:  [id number] Perini  Cardiologist  Jodelle Red 05/06/22 - [facility]  EP- DR Graciela Husbands  Telephone visit by Robin Searing on 01/01/23 for clearance.   Chest x-ray : EKG : 03/26/22  Device - Loop recorder has been removed  PFT- 12023  EP Study- 09/17/21  CT Card- 09/16/21  Afib ablation - 09/15/21  Echo : 08/02/21  Stress test: Cardiac Cath :  Activity level: can do fligjt pf stairs without difficutly  Sleep Study/ CPAP : has Bipap  Fasting Blood Sugar :      / Checks Blood Sugar -- times a day:   Blood Thinner/ Instructions /Last Dose: ASA / Instructions/ Last Dose :    Eliquis- stop 2 days prior per pt Last dose on 02/16/23    Prediabetes - does not check glucose at home on no meds  Hgba1c- 02/10/23- 6.1  Positive for flu per pt on 02/02/23. Was seen at [facility] on 02/03/23 .  CXRdone.On chart.     Symptoms started on 01/30/2023.  PT states never had fever.  Still has some residual cough at preop.  Completed Doxycyclline this am on 02/09/23.  Has Inhaler.  - Trelegy.  Leticia Clas made aware.   Called [facility] and [facility] with [facility] and have requested of note from 02/03/23 and CXR results.  Also called [facility] and spoke with Vernona Rieger in Triage.  Also asked ov note and cxr result be faxed to [facility] at [phone].  Pt was informed at preop that surgery could be delayed by a week .   OV note of 02/03/23 along with CXR results from 02/03/23 and flu ( positive) and covid negative results.   Shanda Bumps [facility] aware.    PT concerned about staying overnite at hospital.  Asked at preop if she had to stay overnite what floor she would be on .  Preop nurse informed pt that she would be on General Surgery floor.  Pt again asked and was informed that I could not tell her exactly what floor or room she would be in.  Pt reports at preop that she did not have good experience when here with knee .  PT also asked what would be cost of her staying overnite and was given [phone] number.   Pt also asked if she would have traveling nurses if stayed overnite.  Pt was informed at preop that preop nurse had no idea what nurses would be staffing floor and pt asked if she could find out. Informed pt no way to find that out.

## 2023-02-10 ENCOUNTER — Encounter (HOSPITAL_COMMUNITY)
Admission: RE | Admit: 2023-02-10 | Discharge: 2023-02-10 | Disposition: A | Discharge status: Home / Self Care | Payer: Medicare Other | Source: Ambulatory Visit | Attending: Surgery | Admitting: Surgery

## 2023-02-10 ENCOUNTER — Other Ambulatory Visit: Payer: Self-pay

## 2023-02-10 ENCOUNTER — Encounter (HOSPITAL_COMMUNITY): Payer: Self-pay

## 2023-02-10 VITALS — BP 146/55 | HR 58 | Temp 98.0°F | Resp 16 | Ht 65.0 in | Wt 169.0 lb

## 2023-02-10 DIAGNOSIS — E213 Hyperparathyroidism, unspecified: Secondary | ICD-10-CM | POA: Insufficient documentation

## 2023-02-10 DIAGNOSIS — Z01812 Encounter for preprocedural laboratory examination: Secondary | ICD-10-CM | POA: Diagnosis not present

## 2023-02-10 DIAGNOSIS — Z87891 Personal history of nicotine dependence: Secondary | ICD-10-CM | POA: Insufficient documentation

## 2023-02-10 DIAGNOSIS — Z8673 Personal history of transient ischemic attack (TIA), and cerebral infarction without residual deficits: Secondary | ICD-10-CM | POA: Insufficient documentation

## 2023-02-10 DIAGNOSIS — E139 Other specified diabetes mellitus without complications: Secondary | ICD-10-CM

## 2023-02-10 DIAGNOSIS — Z87442 Personal history of urinary calculi: Secondary | ICD-10-CM | POA: Insufficient documentation

## 2023-02-10 DIAGNOSIS — I10 Essential (primary) hypertension: Secondary | ICD-10-CM | POA: Insufficient documentation

## 2023-02-10 DIAGNOSIS — M6281 Muscle weakness (generalized): Secondary | ICD-10-CM | POA: Diagnosis not present

## 2023-02-10 DIAGNOSIS — Z01818 Encounter for other preprocedural examination: Secondary | ICD-10-CM

## 2023-02-10 DIAGNOSIS — G4730 Sleep apnea, unspecified: Secondary | ICD-10-CM | POA: Diagnosis not present

## 2023-02-10 DIAGNOSIS — I480 Paroxysmal atrial fibrillation: Secondary | ICD-10-CM | POA: Diagnosis not present

## 2023-02-10 HISTORY — DX: Cardiac murmur, unspecified: R01.1

## 2023-02-10 HISTORY — DX: Unspecified atrial fibrillation: I48.91

## 2023-02-10 HISTORY — DX: Sleep apnea, unspecified: G47.30

## 2023-02-10 LAB — CBC
HCT: 41.2 % (ref 36.0–46.0)
Hemoglobin: 13 g/dL (ref 12.0–15.0)
MCH: 28.6 pg (ref 26.0–34.0)
MCHC: 31.6 g/dL (ref 30.0–36.0)
MCV: 90.7 fL (ref 80.0–100.0)
Platelets: 286 10*3/uL (ref 150–400)
RBC: 4.54 MIL/uL (ref 3.87–5.11)
RDW: 13.2 % (ref 11.5–15.5)
WBC: 5.6 10*3/uL (ref 4.0–10.5)
nRBC: 0 % (ref 0.0–0.2)

## 2023-02-10 LAB — BASIC METABOLIC PANEL
Anion gap: 7 (ref 5–15)
BUN: 16 mg/dL (ref 8–23)
CO2: 26 mmol/L (ref 22–32)
Calcium: 10.1 mg/dL (ref 8.9–10.3)
Chloride: 107 mmol/L (ref 98–111)
Creatinine, Ser: 0.69 mg/dL (ref 0.44–1.00)
GFR, Estimated: >60 mL/min (ref >60)
Glucose, Bld: 114 mg/dL — ABNORMAL HIGH (ref 70–99)
Potassium: 3.7 mmol/L (ref 3.5–5.1)
Sodium: 140 mmol/L (ref 135–145)

## 2023-02-10 LAB — GLUCOSE, CAPILLARY: Glucose-Capillary: 123 mg/dL — ABNORMAL HIGH (ref 70–99)

## 2023-02-11 LAB — HEMOGLOBIN A1C
Hgb A1c MFr Bld: 6.1 % — ABNORMAL HIGH (ref 4.8–5.6)
Mean Plasma Glucose: 128 mg/dL

## 2023-02-11 NOTE — Progress Notes (Signed)
Anesthesia Chart Review   Case: [id number] Date/Time: 02/19/23 0715   Procedure: LEFT INFERIOR PARATHYROIDECTOMY POSSIBLE NECK EXPLORATION (Left) - 2ND SCRUB PERSON FS   Anesthesia type: General   Pre-op diagnosis: HYPERPARATHYROIDISH   [facility] 01 / [facility]   Surgeons: Armandina Gemma, MD       DISCUSSION:82 y.o. former smoker with h/o HTN, sleep apnea, atrial fibrillation, stroke, hyperparathyroidism scheduled for above procedure 02/19/2023 with Dr. Armandina Gemma.   Pt last seen by cardiology 01/02/2023. Per OV note, "The patient affirms she has been doing well without any new cardiac symptoms. They are able to achieve 6 METS without cardiac limitations. Therefore, based on ACC/AHA guidelines, the patient would be at acceptable risk for the planned procedure without further cardiovascular testing. The patient was advised that if she develops new symptoms prior to surgery to contact our office to arrange for a follow-up visit, and she verbalized understanding.    Ms. Croghan perioperative risk of a major cardiac event is 6.6% according to the Revised Cardiac Risk Index (RCRI).  Therefore, she is at high risk for perioperative complications.   Her functional capacity is good at 6.05 METs according to the Duke Activity Status Index (DASI). Recommendations: According to ACC/AHA guidelines, no further cardiovascular testing needed.  The patient may proceed to surgery at acceptable risk.   Antiplatelet and/or Anticoagulation Recommendations:   Eliquis (Apixaban) can be held for 2 days prior to surgery.  Please resume post op when felt to be safe."  Pt dx with Flu 02/03/2023. Case will need to be postponed for 4 weeks from diagnosis with at least 2 weeks from resolution of sx. Discussed with Dr. Gala Lewandowsky office, spoke with triage nurse.  VS: BP (!) 146/55   Pulse (!) 58   Temp 36.7 C (Oral)   Resp 16   Ht 5\' 5"  (1.651 m)   Wt 76.7 kg   SpO2 100%   BMI 28.12 kg/m   PROVIDERS: [name], MD is PCP   Cardiologist: Buford Dresser, MD LABS: Labs reviewed: Acceptable for surgery. (all labs ordered are listed, but only abnormal results are displayed)  Labs Reviewed  BASIC METABOLIC PANEL - Abnormal; Notable for the following components:      Result Value   Glucose, Bld 114 (*)    All other components within normal limits  HEMOGLOBIN A1C - Abnormal; Notable for the following components:   Hgb A1c MFr Bld 6.1 (*)    All other components within normal limits  GLUCOSE, CAPILLARY - Abnormal; Notable for the following components:   Glucose-Capillary 123 (*)    All other components within normal limits  CBC     IMAGES:   EKG:   CV: Echo 08/02/2021 1. Left ventricular ejection fraction, by estimation, is 65 to 70%. Left  ventricular ejection fraction by 3D volume is 68 %. The left ventricle has  normal function. The left ventricle has no regional wall motion  abnormalities. Left ventricular diastolic   parameters are consistent with Grade I diastolic dysfunction (impaired  relaxation). The average left ventricular global longitudinal strain is  -22.5 %. The global longitudinal strain is normal.   2. Normal RV free wall strain. Right ventricular systolic function is  hyperdynamic. The right ventricular size is normal.   3. Normal LA resevoir strain.   4. The mitral valve is grossly normal. Trivial mitral valve  regurgitation.   5. The aortic valve is tricuspid. Aortic valve regurgitation is not  visualized.  Past Medical  History:  Diagnosis Date   Arthritis    Atrial fibrillation (HCC)    Bradycardia    Heart murmur    History of kidney stones    Hypertension    pt denies htn at preop on 02/10/23   Paroxysmal atrial fibrillation (HCC)    Pre-diabetes    Sleep apnea    bipap   Stroke (cerebrum) [facility])     Past Surgical History:  Procedure Laterality Date   ANAL RECTAL MANOMETRY N/A 04/16/2022   Procedure: ANO RECTAL MANOMETRY;  Surgeon:  Thornton Park, MD;  [facility];  Service: Gastroenterology;  Laterality: N/A;   ATRIAL FIBRILLATION ABLATION N/A 09/17/2021   Procedure: ATRIAL FIBRILLATION ABLATION;  Surgeon: Vickie Epley, MD;  Location: [place] CV LAB;  Service: Cardiovascular;  Laterality: N/A;   CATARACT EXTRACTION, BILATERAL     LOOP RECORDER INSERTION N/A 05/17/2018   Procedure: LOOP RECORDER INSERTION;  Surgeon: Deboraha Sprang, MD;  Location: [place] CV LAB;  Service: Cardiovascular;  Laterality: N/A;   LOOP RECORDER INSERTION  05/17/2018   Procedure: LOOP RECORDER INSERTION;  Surgeon: Fay Records, MD;  Location: [place];  Service: Cardiovascular;;   TEE WITHOUT CARDIOVERSION N/A 05/17/2018   Procedure: TRANSESOPHAGEAL ECHOCARDIOGRAM (TEE);  Surgeon: Fay Records, MD;  Location: [place];  Service: Cardiovascular;  Laterality: N/A;   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   Procedure: TOTAL KNEE ARTHROPLASTY;  Surgeon: Sydnee Cabal, MD;  [facility];  Service: Orthopedics;  Laterality: Left;  with adductor canal   TRIGGER FINGER RELEASE     TUMOR REMOVAL Right    Right hand ganglion removed    MEDICATIONS:  apixaban (ELIQUIS) 5 MG TABS tablet   ascorbic acid (VITAMIN C) 500 MG tablet   atorvastatin (LIPITOR) 40 MG tablet   bisacodyl (DULCOLAX) 5 MG EC tablet   cholecalciferol (VITAMIN D3) 25 MCG (1000 UNIT) tablet   clobetasol ointment (TEMOVATE) 0.05 %   Coenzyme Q10 (COQ-10) 100 MG CAPS   doxycycline (VIBRA-TABS) 100 MG tablet   estradiol (ESTRACE) 0.1 MG/GM vaginal cream   Magnesium 250 MG TABS   methocarbamol (ROBAXIN) 500 MG tablet   ondansetron (ZOFRAN) 4 MG tablet   oxyCODONE (OXY IR/ROXICODONE) 5 MG immediate release tablet   traMADol (ULTRAM) 50 MG tablet   triamcinolone (NASACORT ALLERGY 24HR) 55 MCG/ACT AERO nasal inhaler   No current facility-administered medications for this encounter.     Konrad Felix Ward, PA-C [facility] [phone]

## 2023-02-12 ENCOUNTER — Telehealth: Payer: Self-pay | Admitting: Cardiology

## 2023-02-12 NOTE — Telephone Encounter (Signed)
**Note De-Identified Kristin Lowe Obfuscation** I called [employer] to get a update on the pts application for Eliquis but was on hold for more than 10 mins. and per the on hold message there were 9 people still ahead of me. I will call them tomorrow and will update the pt at that time as well.

## 2023-02-12 NOTE — Telephone Encounter (Signed)
Pt calling in to ask about an update on her [employer] eliquis paperwork. She states she spoke to [employer] and they haven't received anything yet. Please advise. She states she will be running out soon.

## 2023-02-13 NOTE — Telephone Encounter (Signed)
**Note De-Identified Kristin Lowe Obfuscation** I have made the pt aware that we are in the process of faxing her application for Eliquis assistance to [employer] at this time. She states that she has 1 month of Eliquis left on hand so she has enough to last until we hear back from [facility]. She thanked me for checking on her application and for calling her back to discuss.

## 2023-02-13 NOTE — Telephone Encounter (Signed)
**Note De-Identified Hanley Rispoli Obfuscation** I have completed a new providers page of a [employer] application for the pts Eliquis and added with the pt application and I have e-mailed it to Dr Mardene Speak nurse so she can obtain our DOD, Dr Camillia Herter signature, date it,  and to fax all to [facility] at the fax number written on the cover letter included.

## 2023-02-13 NOTE — Telephone Encounter (Signed)
Form has been signed and faxed. 

## 2023-02-13 NOTE — Telephone Encounter (Signed)
**Note De-Identified Arturo Sofranko Obfuscation** I s/w Deja at [facility] and she advised me that they have not received an application for the pt this year. Per our records, we faxed the pts application for Eliquis assistance ti them on 2/27.  Forwarding this message to Dr Claudie Revering nurse to see if she still has the pts [employer] application that Dr Quentin Ore signed and dated on 2/27 and if not, I will complete another providers page with our DODs information and e-mail the pts entire application to her so she can obtain DODs signature and to re-fax to [employer].

## 2023-02-17 DIAGNOSIS — M6281 Muscle weakness (generalized): Secondary | ICD-10-CM | POA: Diagnosis not present

## 2023-02-18 DIAGNOSIS — J45909 Unspecified asthma, uncomplicated: Secondary | ICD-10-CM | POA: Diagnosis not present

## 2023-02-18 DIAGNOSIS — J309 Allergic rhinitis, unspecified: Secondary | ICD-10-CM | POA: Diagnosis not present

## 2023-02-18 DIAGNOSIS — J209 Acute bronchitis, unspecified: Secondary | ICD-10-CM | POA: Diagnosis not present

## 2023-02-19 DIAGNOSIS — M6281 Muscle weakness (generalized): Secondary | ICD-10-CM | POA: Diagnosis not present

## 2023-02-24 DIAGNOSIS — M6281 Muscle weakness (generalized): Secondary | ICD-10-CM | POA: Diagnosis not present

## 2023-02-25 NOTE — Telephone Encounter (Addendum)
**Note De-Identified Whitten Andreoni Obfuscation** Letter received from [facility] stating that the pt is not eligible for Eliquis assistance because: She did not provide proof of denial for Medicare Part D "[place]" or "Extra Help". Documentation of 3% out of pocket RX expenses for her household's adjusted gross income. The letter states that they have notified the pt of all of the above as well. [id number]

## 2023-02-26 DIAGNOSIS — M6281 Muscle weakness (generalized): Secondary | ICD-10-CM | POA: Diagnosis not present

## 2023-03-17 DIAGNOSIS — M6281 Muscle weakness (generalized): Secondary | ICD-10-CM | POA: Diagnosis not present

## 2023-03-18 ENCOUNTER — Encounter (HOSPITAL_COMMUNITY): Admission: RE | Admit: 2023-03-18 | Payer: Medicare Other | Source: Ambulatory Visit

## 2023-03-18 NOTE — Progress Notes (Addendum)
Anesthesia Review:  PCP: Rodrigo Ran-  LOV with Jarome Lamas, NP- dated 02/03/23 on chart  Cardiologist : DR Jodelle Red  LOV 05/06/22  01/02/23- Preop CV teleophone visit with cardiology  Chest x-ray : EKG : 03/20/23  Device Check- 10/02/2021 Afib ablation- 09/17/21  Hx of Loop Recorder- has been removed  Echo : 08/02/21  Stress test: Cardiac Cath :  Activity level:  Sleep Study/ CPAP : Fasting Blood Sugar :      / Checks Blood Sugar -- times a day:   Blood Thinner/ Instructions /Last Dose: ASA / Instructions/ Last Dose :    Eliquis - stop 2 days prior to surgery per pt    Prediabetes- does not check glucose at home on no meds  Hgba1c- 02/10/23- 6.1    Flu positive on 02/02/23 OV note on chart from PCP dated 02/03/23.  Surgery originally to be done 01/2023 Postponed due to flu positive after original preop appt.  PT seen by PCP again and received another round of meds.  AT preop on 03/20/23 pt complains of tiredness thinks due to parathyroid.  PT still has residual cough.  Informed Terance Hart, PAC.  Terance Hart assessed pt and listened to lungs.  No CXR ordered.  PT in no acute distress Sat-99.  PT does not complain of any shortness of breath at preop visit.  Have requested most recent ov note from PCP.  They are to fax over. LOV 02/18/23 on chart    PT refuses to be on ortho floor post surgery due to bad experience from knee surgery.

## 2023-03-19 DIAGNOSIS — M6281 Muscle weakness (generalized): Secondary | ICD-10-CM | POA: Diagnosis not present

## 2023-03-19 NOTE — Patient Instructions (Signed)
SURGICAL WAITING ROOM VISITATION  Patients having surgery or a procedure may have no more than 2 support people in the waiting area - these visitors may rotate.    Children under the age of 20 must have an adult with them who is not the patient.  Due to an increase in RSV and influenza rates and associated hospitalizations, children ages 74 and under may not visit patients in [facility] hospitals.  If the patient needs to stay at the hospital during part of their recovery, the visitor guidelines for inpatient rooms apply. Pre-op nurse will coordinate an appropriate time for 1 support person to accompany patient in pre-op.  This support person may not rotate.    Please refer to the [facility] website for the visitor guidelines for Inpatients (after your surgery is over and you are in a regular room).       Your procedure is scheduled on:  03/30/2023    Report to [facility] Main Entrance    Report to admitting at  0515 AM   Call this number if you have problems the morning of surgery [phone]   Do not eat food :After Midnight.   After Midnight you may have the following liquids until __ 0430____ AM  DAY OF SURGERY  Water Non-Citrus Juices (without pulp, NO RED-Apple, White grape, White cranberry) Black Coffee (NO MILK/CREAM OR CREAMERS, sugar ok)  Clear Tea (NO MILK/CREAM OR CREAMERS, sugar ok) regular and decaf                             Plain Jell-O (NO RED)                                           Fruit ices (not with fruit pulp, NO RED)                                     Popsicles (NO RED)                                                               Sports drinks like Gatorade (NO RED)                             If you have questions, please contact your surgeon's office.       Oral Hygiene is also important to reduce your risk of infection.                                    Remember - BRUSH YOUR TEETH THE MORNING OF SURGERY WITH YOUR REGULAR  TOOTHPASTE  DENTURES WILL BE REMOVED PRIOR TO SURGERY PLEASE DO NOT APPLY "Poly grip" OR ADHESIVES!!!   Do NOT smoke after Midnight   Take these medicines the morning of surgery with A SIP OF WATER:  none   DO NOT TAKE ANY ORAL DIABETIC MEDICATIONS DAY OF YOUR SURGERY  Bring CPAP mask and  tubing day of surgery.                              You may not have any metal on your body including hair pins, jewelry, and body piercing             Do not wear make-up, lotions, powders, perfumes/cologne, or deodorant  Do not wear nail polish including gel and S&S, artificial/acrylic nails, or any other type of covering on natural nails including finger and toenails. If you have artificial nails, gel coating, etc. that needs to be removed by a nail salon please have this removed prior to surgery or surgery may need to be canceled/ delayed if the surgeon/ anesthesia feels like they are unable to be safely monitored.   Do not shave  48 hours prior to surgery.               Men may shave face and neck.   Do not bring valuables to the hospital. [place].   Contacts, glasses, dentures or bridgework may not be worn into surgery.   Bring small overnight bag day of surgery.   DO NOT [place]. PHARMACY WILL DISPENSE MEDICATIONS LISTED ON YOUR MEDICATION LIST TO YOU DURING YOUR ADMISSION [place]!    Patients discharged on the day of surgery will not be allowed to drive home.  Someone NEEDS to stay with you for the first 24 hours after anesthesia.   Special Instructions: Bring a copy of your healthcare power of attorney and living will documents the day of surgery if you haven't scanned them before.              Please read over the following fact sheets you were given: IF [place] [phone]   If you received a COVID test during your pre-op visit  it is  requested that you wear a mask when out in public, stay away from anyone that may not be feeling well and notify your surgeon if you develop symptoms. If you test positive for Covid or have been in contact with anyone that has tested positive in the last 10 days please notify you surgeon.    [place] - Preparing for Surgery Before surgery, you can play an important role.  Because skin is not sterile, your skin needs to be as free of germs as possible.  You can reduce the number of germs on your skin by washing with CHG (chlorahexidine gluconate) soap before surgery.  CHG is an antiseptic cleaner which kills germs and bonds with the skin to continue killing germs even after washing. Please DO NOT use if you have an allergy to CHG or antibacterial soaps.  If your skin becomes reddened/irritated stop using the CHG and inform your nurse when you arrive at Short Stay. Do not shave (including legs and underarms) for at least 48 hours prior to the first CHG shower.  You may shave your face/neck. Please follow these instructions carefully:  1.  Shower with CHG Soap the night before surgery and the  morning of Surgery.  2.  If you choose to wash your hair, wash your hair first as usual with your  normal  shampoo.  3.  After you shampoo, rinse your hair and  body thoroughly to remove the  shampoo.                           4.  Use CHG as you would any other liquid soap.  You can apply chg directly  to the skin and wash                       Gently with a scrungie or clean washcloth.  5.  Apply the CHG Soap to your body ONLY FROM THE NECK DOWN.   Do not use on face/ open                           Wound or open sores. Avoid contact with eyes, ears mouth and genitals (private parts).                       Wash face,  Genitals (private parts) with your normal soap.             6.  Wash thoroughly, paying special attention to the area where your surgery  will be performed.  7.  Thoroughly rinse your body with warm  water from the neck down.  8.  DO NOT shower/wash with your normal soap after using and rinsing off  the CHG Soap.                9.  Pat yourself dry with a clean towel.            10.  Wear clean pajamas.            11.  Place clean sheets on your bed the night of your first shower and do not  sleep with pets. Day of Surgery : Do not apply any lotions/deodorants the morning of surgery.  Please wear clean clothes to the hospital/surgery center.  FAILURE TO FOLLOW THESE INSTRUCTIONS MAY RESULT IN THE CANCELLATION OF YOUR SURGERY PATIENT SIGNATURE_________________________________  NURSE SIGNATURE__________________________________  ________________________________________________________________________

## 2023-03-20 ENCOUNTER — Encounter (HOSPITAL_COMMUNITY): Payer: Self-pay

## 2023-03-20 ENCOUNTER — Other Ambulatory Visit: Payer: Self-pay

## 2023-03-20 ENCOUNTER — Encounter (HOSPITAL_COMMUNITY)
Admission: RE | Admit: 2023-03-20 | Discharge: 2023-03-20 | Disposition: A | Discharge status: Home / Self Care | Payer: Medicare Other | Source: Ambulatory Visit | Attending: Surgery | Admitting: Surgery

## 2023-03-20 VITALS — BP 136/68 | HR 60 | Temp 97.9°F | Resp 16 | Ht 66.0 in | Wt 168.9 lb

## 2023-03-20 DIAGNOSIS — Z87891 Personal history of nicotine dependence: Secondary | ICD-10-CM | POA: Diagnosis not present

## 2023-03-20 DIAGNOSIS — I480 Paroxysmal atrial fibrillation: Secondary | ICD-10-CM | POA: Insufficient documentation

## 2023-03-20 DIAGNOSIS — G4730 Sleep apnea, unspecified: Secondary | ICD-10-CM | POA: Diagnosis not present

## 2023-03-20 DIAGNOSIS — E213 Hyperparathyroidism, unspecified: Secondary | ICD-10-CM | POA: Insufficient documentation

## 2023-03-20 DIAGNOSIS — Z8673 Personal history of transient ischemic attack (TIA), and cerebral infarction without residual deficits: Secondary | ICD-10-CM | POA: Insufficient documentation

## 2023-03-20 DIAGNOSIS — I10 Essential (primary) hypertension: Secondary | ICD-10-CM | POA: Diagnosis not present

## 2023-03-20 DIAGNOSIS — Z01818 Encounter for other preprocedural examination: Secondary | ICD-10-CM | POA: Diagnosis not present

## 2023-03-20 LAB — CBC
HCT: 38.4 % (ref 36.0–46.0)
Hemoglobin: 12.2 g/dL (ref 12.0–15.0)
MCH: 29.1 pg (ref 26.0–34.0)
MCHC: 31.8 g/dL (ref 30.0–36.0)
MCV: 91.6 fL (ref 80.0–100.0)
Platelets: 285 10*3/uL (ref 150–400)
RBC: 4.19 MIL/uL (ref 3.87–5.11)
RDW: 13.8 % (ref 11.5–15.5)
WBC: 5.6 10*3/uL (ref 4.0–10.5)
nRBC: 0 % (ref 0.0–0.2)

## 2023-03-20 LAB — BASIC METABOLIC PANEL
Anion gap: 9 (ref 5–15)
BUN: 13 mg/dL (ref 8–23)
CO2: 24 mmol/L (ref 22–32)
Calcium: 10.4 mg/dL — ABNORMAL HIGH (ref 8.9–10.3)
Chloride: 106 mmol/L (ref 98–111)
Creatinine, Ser: 0.7 mg/dL (ref 0.44–1.00)
GFR, Estimated: >60 mL/min (ref >60)
Glucose, Bld: 97 mg/dL (ref 70–99)
Potassium: 4.5 mmol/L (ref 3.5–5.1)
Sodium: 139 mmol/L (ref 135–145)

## 2023-03-20 LAB — GLUCOSE, CAPILLARY: Glucose-Capillary: 82 mg/dL (ref 70–99)

## 2023-03-23 ENCOUNTER — Encounter (HOSPITAL_BASED_OUTPATIENT_CLINIC_OR_DEPARTMENT_OTHER): Payer: Self-pay

## 2023-03-23 DIAGNOSIS — Z4789 Encounter for other orthopedic aftercare: Secondary | ICD-10-CM | POA: Diagnosis not present

## 2023-03-23 NOTE — Progress Notes (Signed)
Anesthesia chart review   Case: [id number] Date/Time: 03/30/23 0715   Procedure: LEFT INFERIOR PARATHYROIDECTOMY POSSIBLE NECK EXPLORATION (Left) - 2ND SCRUB PERSON FS   Anesthesia type: General   Pre-op diagnosis: HYPERPARATHYROIDISM   [facility]   Surgeons: [name], MD       DISCUSSION: 82 year old former smoker with history of HTN, sleep apnea, stroke, PAF, hyperparathyroidism scheduled for above procedure 03/30/2023 with Dr. [name].  Case) in March due to flu diagnosis 02/03/2023.  Symptoms resolved at this time.  Current previous anesthesia chart review note, "Pt last seen by cardiology 01/02/2023. Per OV note, "The patient affirms she has been doing well without any new cardiac symptoms. They are able to achieve 6 METS without cardiac limitations. Therefore, based on ACC/AHA guidelines, the patient would be at acceptable risk for the planned procedure without further cardiovascular testing. The patient was advised that if she develops new symptoms prior to surgery to contact our office to arrange for a follow-up visit, and she verbalized understanding.    Ms. Depaula perioperative risk of a major cardiac event is 6.6% according to the Revised Cardiac Risk Index (RCRI).  Therefore, she is at high risk for perioperative complications.   Her functional capacity is good at 6.05 METs according to the Duke Activity Status Index (DASI). Recommendations: According to ACC/AHA guidelines, no further cardiovascular testing needed.  The patient may proceed to surgery at acceptable risk.   Antiplatelet and/or Anticoagulation Recommendations:   Eliquis (Apixaban) can be held for 2 days prior to surgery.  Please resume post op when felt to be safe."" VS: BP 136/68   Pulse 60   Temp 36.6 C (Oral)   Resp 16   Ht 5\' 6"  (1.676 m)   Wt 76.6 kg   SpO2 99%   BMI 27.26 kg/m   PROVIDERS: Rodrigo Ran, MD is PCP  Cardiologist: Jodelle Red, MD  LABS: Labs  reviewed: Acceptable for surgery. (all labs ordered are listed, but only abnormal results are displayed)  Labs Reviewed  BASIC METABOLIC PANEL - Abnormal; Notable for the following components:      Result Value   Calcium 10.4 (*)    All other components within normal limits  CBC  GLUCOSE, CAPILLARY     IMAGES:   EKG:   CV: Echo 08/02/2021 1. Left ventricular ejection fraction, by estimation, is 65 to 70%. Left  ventricular ejection fraction by 3D volume is 68 %. The left ventricle has  normal function. The left ventricle has no regional wall motion  abnormalities. Left ventricular diastolic   parameters are consistent with Grade I diastolic dysfunction (impaired  relaxation). The average left ventricular global longitudinal strain is  -22.5 %. The global longitudinal strain is normal.   2. Normal RV free wall strain. Right ventricular systolic function is  hyperdynamic. The right ventricular size is normal.   3. Normal LA resevoir strain.   4. The mitral valve is grossly normal. Trivial mitral valve  regurgitation.   5. The aortic valve is tricuspid. Aortic valve regurgitation is not  visualized.   Past Medical History:  Diagnosis Date   Arthritis    Atrial fibrillation (HCC)    Bradycardia    Heart murmur    History of kidney stones    Hypertension    pt denies htn at preop on 02/10/23   Paroxysmal atrial fibrillation (HCC)    Pre-diabetes    Sleep apnea    bipap   Stroke (cerebrum) (  HCC)     Past Surgical History:  Procedure Laterality Date   ANAL RECTAL MANOMETRY N/A 04/16/2022   Procedure: ANO RECTAL MANOMETRY;  Surgeon: Tressia Danas, MD;  [facility];  Service: Gastroenterology;  Laterality: N/A;   ATRIAL FIBRILLATION ABLATION N/A 09/17/2021   Procedure: ATRIAL FIBRILLATION ABLATION;  Surgeon: Lanier Prude, MD;  [facility]: [facility] LAB;  Service: Cardiovascular;  Laterality: N/A;   CATARACT EXTRACTION, BILATERAL     LOOP RECORDER  INSERTION N/A 05/17/2018   Procedure: LOOP RECORDER INSERTION;  Surgeon: Duke Salvia, MD;  [facility] INVASIVE CV LAB;  Service: Cardiovascular;  Laterality: N/A;   LOOP RECORDER INSERTION  05/17/2018   Procedure: LOOP RECORDER INSERTION;  Surgeon: Pricilla Riffle, MD;  Location: [facility] ENDOSCOPY;  Service: Cardiovascular;;   TEE WITHOUT CARDIOVERSION N/A 05/17/2018   Procedure: TRANSESOPHAGEAL ECHOCARDIOGRAM (TEE);  Surgeon: Pricilla Riffle, MD;  Location: [facility] ENDOSCOPY;  Service: Cardiovascular;  Laterality: N/A;   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   Procedure: TOTAL KNEE ARTHROPLASTY;  Surgeon: Eugenia Mcalpine, MD;  [facility];  Service: Orthopedics;  Laterality: Left;  with adductor canal   TRIGGER FINGER RELEASE     TUMOR REMOVAL Right    Right hand ganglion removed    MEDICATIONS:  acetaminophen (TYLENOL) 500 MG tablet   azelastine (ASTELIN) 0.1 % nasal spray   apixaban (ELIQUIS) 5 MG TABS tablet   ascorbic acid (VITAMIN C) 500 MG tablet   atorvastatin (LIPITOR) 40 MG tablet   bisacodyl (DULCOLAX) 5 MG EC tablet   cholecalciferol (VITAMIN D3) 25 MCG (1000 UNIT) tablet   clobetasol ointment (TEMOVATE) 0.05 %   Coenzyme Q10 (COQ-10) 100 MG CAPS   doxycycline (VIBRA-TABS) 100 MG tablet   estradiol (ESTRACE) 0.1 MG/GM vaginal cream   Magnesium 250 MG TABS   methocarbamol (ROBAXIN) 500 MG tablet   ondansetron (ZOFRAN) 4 MG tablet   oxyCODONE (OXY IR/ROXICODONE) 5 MG immediate release tablet   traMADol (ULTRAM) 50 MG tablet   triamcinolone (NASACORT ALLERGY 24HR) 55 MCG/ACT AERO nasal inhaler   No current facility-administered medications for this encounter.    Jodell Cipro Ward, PA-C [facility] [phone]

## 2023-03-24 DIAGNOSIS — M6281 Muscle weakness (generalized): Secondary | ICD-10-CM | POA: Diagnosis not present

## 2023-03-26 ENCOUNTER — Encounter (HOSPITAL_COMMUNITY): Payer: Self-pay | Admitting: Surgery

## 2023-03-26 DIAGNOSIS — E210 Primary hyperparathyroidism: Secondary | ICD-10-CM | POA: Diagnosis present

## 2023-03-26 NOTE — H&P (Signed)
Chief Complaint: Follow-up (Primary hyperparathyroidism)  History of Present Illness:  Patient returns for surgical follow-up and to review the results of testing for evaluation of suspected primary hyperparathyroidism and hypercalcemia. Patient underwent an ultrasound examination in early December 2023. This demonstrated a 7 mm oval hypoechoic nodule along the inferior aspect of the left thyroid lobe. This was felt to represent a left inferior parathyroid gland. However, nuclear medicine sestamibi scan failed to localize a parathyroid adenoma. Therefore the patient underwent a 4D CT scan of the neck with parathyroid protocol on December 01, 2022. 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 Laboratory studies showed a PTH level of 46 which is unsuppressed given her hypercalcemia. Patient does have osteoporosis and has been discussing treatment options with her primary care physician, Dr. Rodrigo Ran. Patient has a history of atrial fibrillation and is currently on Eliquis. She will require cardiac clearance prior to scheduling any surgical intervention.  Review of Systems: A complete review of systems was obtained from the patient. I have reviewed this information and discussed as appropriate with the patient. See HPI as well for other ROS.  Review of Systems Constitutional: Negative. HENT: Negative. Eyes: Negative. Respiratory: Negative. Cardiovascular: Positive for palpitations. Gastrointestinal: Negative. Genitourinary: Negative. Musculoskeletal: Negative. Skin: Negative. Neurological: Negative. Endo/Heme/Allergies: Negative. Psychiatric/Behavioral: Negative.   Medical History: Past Medical History: Diagnosis Date Arthritis DVT (deep venous thrombosis) (CMS-HCC) History of stroke Sleep apnea  Patient Active Problem List Diagnosis Hypercalcemia Primary hyperparathyroidism (CMS-HCC)  Past Surgical  History: Procedure Laterality Date JOINT REPLACEMENT   No Known Allergies  Current Outpatient Medications on File Prior to Visit Medication Sig Dispense Refill apixaban (ELIQUIS) 5 mg tablet as directed atorvastatin (LIPITOR) 40 MG tablet Take 1 tablet by mouth once daily cholecalciferol (VITAMIN D3) 1000 unit tablet Take by mouth clobetasoL (TEMOVATE) 0.05 % cream 1 application Externally Twice a day co-enzyme Q-10, ubiquinone, 50 mg capsule Take by mouth DULCOLAX, BISACODYL, 5 mg EC tablet 1 tablet as needed Orally Once a day magnesium 250 mg Tab Take by mouth oxyCODONE (ROXICODONE) 5 MG immediate release tablet TAKE 1 TABLET BY MOUTH EVERY 4 HOURS traMADoL (ULTRAM) 50 mg tablet triamcinolone (NASACORT AQ) 55 mcg nasal spray Place into one nostril ZOFRAN 4 mg tablet Take 1 tablet twice a day by oral route.  No current facility-administered medications on file prior to visit.  Family History Problem Relation Age of Onset Obesity Mother Diabetes Mother Breast cancer Mother Hyperlipidemia (Elevated cholesterol) Father High blood pressure (Hypertension) Sister Hyperlipidemia (Elevated cholesterol) Sister Diabetes Brother Hyperlipidemia (Elevated cholesterol) Brother   Social History  Tobacco Use Smoking Status Former Types: Cigarettes Smokeless Tobacco Never   Social History  Socioeconomic History Marital status: Single Tobacco Use Smoking status: Former Types: Cigarettes Smokeless tobacco: Never Vaping Use Vaping Use: Never used Substance and Sexual Activity Alcohol use: Yes Drug use: Never  Objective:  Physical Exam  GENERAL APPEARANCE Comfortable, no acute issues Development: normal Gross deformities: none  SKIN Rash, lesions, ulcers: none Induration, erythema: none Nodules: none palpable  EYES Conjunctiva and lids: normal Pupils: equal and reactive  EARS, NOSE, MOUTH, THROAT External ears: no lesion or deformity External nose: no lesion  or deformity Hearing: grossly normal  NECK Symmetric: yes Trachea: midline Thyroid: no palpable nodules in the thyroid bed  CHEST Respiratory effort: normal Retraction or accessory muscle use: no Breath sounds: normal bilaterally Rales, rhonchi, wheeze: none  CARDIOVASCULAR Auscultation: irregular rhythm, normal  rate Murmurs: none Pulses: radial pulse 2+ palpable Lower extremity edema: mild bilateral at ankles  ABDOMEN Not assessed  GENITOURINARY/RECTAL Not assessed  MUSCULOSKELETAL Station and gait: normal Digits and nails: no clubbing or cyanosis Muscle strength: grossly normal all extremities Range of motion: grossly normal all extremities Deformity: none Mild bilateral tremor in hands  LYMPHATIC Cervical: none palpable Supraclavicular: none palpable  PSYCHIATRIC Oriented to person, place, and time: yes Mood and affect: normal for situation Judgment and insight: appropriate for situation   Assessment and Plan:  Hypercalcemia Primary hyperparathyroidism (CMS-HCC)  Patient returns for surgical follow-up of suspected primary hyperparathyroidism. Today we reviewed the results of her ultrasound, nuclear medicine parathyroid scan, and 4D CT scan of the neck. Two of these studies indicate the presence of an abnormal parathyroid gland in the left inferior position. Laboratory studies demonstrated an unsuppressed PTH level supporting the diagnosis of primary hyperparathyroidism. Patient does have complications including significant osteoporosis.  I have recommended proceeding to surgery. My strategy would be for a minimally invasive approach to the left inferior parathyroid gland. If this proved to be an adenoma, then I believe she could have the minimally invasive procedure as an outpatient surgery. If the left inferior gland was not consistent with parathyroid adenoma, then we would need to perform a more thorough neck exploration which might require an overnight  hospital stay. Patient understands. She would like to have an outpatient procedure. She agrees to proceed with surgery as I have outlined.  We will request clearance from her cardiologist as well as guidance on perioperative management of her anticoagulation. Once this is obtained, we will contact the patient to schedule a date for her procedure.  [name], MD [facility] practice Office: [phone]

## 2023-03-28 NOTE — Anesthesia Preprocedure Evaluation (Signed)
Anesthesia Evaluation  Patient identified by MRN, date of birth, ID band Patient awake    Reviewed: Allergy & Precautions, NPO status , Patient's Chart, lab work & pertinent test results  History of Anesthesia Complications Negative for: history of anesthetic complications  Airway Mallampati: II  TM Distance: >3 FB Neck ROM: Full    Dental no notable dental hx.    Pulmonary sleep apnea (BiPAP) , former smoker   Pulmonary exam normal        Cardiovascular hypertension, + CAD  Normal cardiovascular exam+ dysrhythmias (on Eliquis) Atrial Fibrillation      Neuro/Psych CVA  negative psych ROS   GI/Hepatic negative GI ROS, Neg liver ROS,,,  Endo/Other  hyperparathyroidism  Renal/GU negative Renal ROS  negative genitourinary   Musculoskeletal  (+) Arthritis ,    Abdominal   Peds  Hematology negative hematology ROS (+)   Anesthesia Other Findings Day of surgery medications reviewed with patient.  Reproductive/Obstetrics negative OB ROS                              Anesthesia Physical Anesthesia Plan  ASA: 2  Anesthesia Plan: General   Post-op Pain Management: Tylenol PO (pre-op)*   Induction: Intravenous  PONV Risk Score and Plan: 3 and Treatment may vary due to age or medical condition, Dexamethasone and Ondansetron  Airway Management Planned: Oral ETT  Additional Equipment: None  Intra-op Plan:   Post-operative Plan: Extubation in OR  Informed Consent: I have reviewed the patients History and Physical, chart, labs and discussed the procedure including the risks, benefits and alternatives for the proposed anesthesia with the patient or authorized representative who has indicated his/her understanding and acceptance.     Dental advisory given  Plan Discussed with: CRNA  Anesthesia Plan Comments:         Anesthesia Quick Evaluation

## 2023-03-30 ENCOUNTER — Ambulatory Visit (HOSPITAL_BASED_OUTPATIENT_CLINIC_OR_DEPARTMENT_OTHER): Payer: Medicare Other | Admitting: Anesthesiology

## 2023-03-30 ENCOUNTER — Ambulatory Visit (HOSPITAL_COMMUNITY): Payer: Medicare Other | Admitting: Physician Assistant

## 2023-03-30 ENCOUNTER — Ambulatory Visit (HOSPITAL_COMMUNITY)
Admission: RE | Admit: 2023-03-30 | Discharge: 2023-03-30 | Disposition: A | Discharge status: Home / Self Care | Payer: Medicare Other | Source: Ambulatory Visit | Attending: Surgery | Admitting: Surgery

## 2023-03-30 ENCOUNTER — Encounter (HOSPITAL_COMMUNITY)
Admission: RE | Disposition: A | Discharge status: Home / Self Care | Payer: Self-pay | Source: Ambulatory Visit | Attending: Surgery

## 2023-03-30 ENCOUNTER — Other Ambulatory Visit: Payer: Self-pay

## 2023-03-30 ENCOUNTER — Encounter (HOSPITAL_COMMUNITY): Payer: Self-pay | Admitting: Surgery

## 2023-03-30 DIAGNOSIS — I10 Essential (primary) hypertension: Secondary | ICD-10-CM | POA: Diagnosis not present

## 2023-03-30 DIAGNOSIS — E210 Primary hyperparathyroidism: Secondary | ICD-10-CM | POA: Diagnosis present

## 2023-03-30 DIAGNOSIS — M810 Age-related osteoporosis without current pathological fracture: Secondary | ICD-10-CM | POA: Diagnosis not present

## 2023-03-30 DIAGNOSIS — E213 Hyperparathyroidism, unspecified: Secondary | ICD-10-CM | POA: Diagnosis not present

## 2023-03-30 DIAGNOSIS — Z87891 Personal history of nicotine dependence: Secondary | ICD-10-CM

## 2023-03-30 DIAGNOSIS — I2510 Atherosclerotic heart disease of native coronary artery without angina pectoris: Secondary | ICD-10-CM

## 2023-03-30 DIAGNOSIS — Z7901 Long term (current) use of anticoagulants: Secondary | ICD-10-CM | POA: Diagnosis not present

## 2023-03-30 DIAGNOSIS — D351 Benign neoplasm of parathyroid gland: Secondary | ICD-10-CM | POA: Diagnosis not present

## 2023-03-30 DIAGNOSIS — Z01818 Encounter for other preprocedural examination: Secondary | ICD-10-CM

## 2023-03-30 DIAGNOSIS — I4891 Unspecified atrial fibrillation: Secondary | ICD-10-CM | POA: Diagnosis not present

## 2023-03-30 HISTORY — PX: PARATHYROIDECTOMY: SHX19

## 2023-03-30 SURGERY — PARATHYROIDECTOMY
Anesthesia: General | Laterality: Left

## 2023-03-30 MED ORDER — CHLORHEXIDINE GLUCONATE CLOTH 2 % EX PADS: 6.0000 | MEDICATED_PAD | Freq: Once | CUTANEOUS | Status: DC

## 2023-03-30 MED ORDER — ORAL CARE MOUTH RINSE: 15.0000 mL | Freq: Once | OROMUCOSAL | Status: AC

## 2023-03-30 MED ORDER — ONDANSETRON HCL 4 MG/2ML IJ SOLN
INTRAMUSCULAR | Status: AC
  Filled 2023-03-30: qty 2

## 2023-03-30 MED ORDER — DEXAMETHASONE SODIUM PHOSPHATE 10 MG/ML IJ SOLN
INTRAMUSCULAR | Status: DC | PRN
  Administered 2023-03-30: 8 mg via INTRAVENOUS

## 2023-03-30 MED ORDER — OXYCODONE HCL 5 MG/5ML PO SOLN: 5.0000 mg | Freq: Once | ORAL | Status: AC | PRN

## 2023-03-30 MED ORDER — LACTATED RINGERS IV SOLN: INTRAVENOUS | Status: DC

## 2023-03-30 MED ORDER — ROCURONIUM BROMIDE 10 MG/ML (PF) SYRINGE
PREFILLED_SYRINGE | INTRAVENOUS | Status: AC
  Filled 2023-03-30: qty 10

## 2023-03-30 MED ORDER — CEFAZOLIN SODIUM-DEXTROSE 2-4 GM/100ML-% IV SOLN
2.0000 g | INTRAVENOUS | Status: AC
  Administered 2023-03-30: 2 g via INTRAVENOUS
  Filled 2023-03-30: qty 100

## 2023-03-30 MED ORDER — HEMOSTATIC AGENTS (NO CHARGE) OPTIME
TOPICAL | Status: DC | PRN
  Administered 2023-03-30: 1

## 2023-03-30 MED ORDER — FENTANYL CITRATE (PF) 100 MCG/2ML IJ SOLN
INTRAMUSCULAR | Status: DC | PRN
  Administered 2023-03-30 (×2): 50 ug via INTRAVENOUS

## 2023-03-30 MED ORDER — CHLORHEXIDINE GLUCONATE 0.12 % MT SOLN
15.0000 mL | Freq: Once | OROMUCOSAL | Status: AC
  Administered 2023-03-30: 15 mL via OROMUCOSAL

## 2023-03-30 MED ORDER — PHENYLEPHRINE 80 MCG/ML (10ML) SYRINGE FOR IV PUSH (FOR BLOOD PRESSURE SUPPORT)
PREFILLED_SYRINGE | INTRAVENOUS | Status: AC
  Filled 2023-03-30: qty 10

## 2023-03-30 MED ORDER — ACETAMINOPHEN 500 MG PO TABS
1000.0000 mg | ORAL_TABLET | Freq: Once | ORAL | Status: AC
  Administered 2023-03-30: 1000 mg via ORAL
  Filled 2023-03-30: qty 2

## 2023-03-30 MED ORDER — LIDOCAINE HCL (PF) 2 % IJ SOLN
INTRAMUSCULAR | Status: AC
  Filled 2023-03-30: qty 5

## 2023-03-30 MED ORDER — FENTANYL CITRATE (PF) 100 MCG/2ML IJ SOLN
INTRAMUSCULAR | Status: AC
  Filled 2023-03-30: qty 2

## 2023-03-30 MED ORDER — FENTANYL CITRATE PF 50 MCG/ML IJ SOSY
25.0000 ug | PREFILLED_SYRINGE | INTRAMUSCULAR | Status: DC | PRN
  Administered 2023-03-30: 50 ug via INTRAVENOUS

## 2023-03-30 MED ORDER — TRAMADOL HCL 50 MG PO TABS: 50.0000 mg | ORAL_TABLET | Freq: Four times a day (QID) | ORAL | 0 refills | Status: DC | PRN

## 2023-03-30 MED ORDER — ROCURONIUM BROMIDE 10 MG/ML (PF) SYRINGE
PREFILLED_SYRINGE | INTRAVENOUS | Status: DC | PRN
  Administered 2023-03-30: 60 mg via INTRAVENOUS

## 2023-03-30 MED ORDER — DEXAMETHASONE SODIUM PHOSPHATE 10 MG/ML IJ SOLN
INTRAMUSCULAR | Status: AC
  Filled 2023-03-30: qty 1

## 2023-03-30 MED ORDER — 0.9 % SODIUM CHLORIDE (POUR BTL) OPTIME
TOPICAL | Status: DC | PRN
  Administered 2023-03-30: 1000 mL

## 2023-03-30 MED ORDER — OXYCODONE HCL 5 MG PO TABS
5.0000 mg | ORAL_TABLET | Freq: Once | ORAL | Status: AC | PRN
  Administered 2023-03-30: 5 mg via ORAL

## 2023-03-30 MED ORDER — PROPOFOL 10 MG/ML IV BOLUS
INTRAVENOUS | Status: AC
  Filled 2023-03-30: qty 20

## 2023-03-30 MED ORDER — BUPIVACAINE HCL (PF) 0.25 % IJ SOLN
INTRAMUSCULAR | Status: AC
  Filled 2023-03-30: qty 30

## 2023-03-30 MED ORDER — BUPIVACAINE HCL 0.25 % IJ SOLN
INTRAMUSCULAR | Status: DC | PRN
  Administered 2023-03-30: 10 mL

## 2023-03-30 MED ORDER — FENTANYL CITRATE PF 50 MCG/ML IJ SOSY
PREFILLED_SYRINGE | INTRAMUSCULAR | Status: AC
  Filled 2023-03-30: qty 1

## 2023-03-30 MED ORDER — SUGAMMADEX SODIUM 200 MG/2ML IV SOLN
INTRAVENOUS | Status: DC | PRN
  Administered 2023-03-30: 200 mg via INTRAVENOUS

## 2023-03-30 MED ORDER — OXYCODONE HCL 5 MG PO TABS
ORAL_TABLET | ORAL | Status: AC
  Filled 2023-03-30: qty 1

## 2023-03-30 MED ORDER — PROPOFOL 10 MG/ML IV BOLUS
INTRAVENOUS | Status: DC | PRN
  Administered 2023-03-30: 170 mg via INTRAVENOUS

## 2023-03-30 MED ORDER — LIDOCAINE 2% (20 MG/ML) 5 ML SYRINGE
INTRAMUSCULAR | Status: DC | PRN
  Administered 2023-03-30: 60 mg via INTRAVENOUS

## 2023-03-30 MED ORDER — PHENYLEPHRINE 80 MCG/ML (10ML) SYRINGE FOR IV PUSH (FOR BLOOD PRESSURE SUPPORT)
PREFILLED_SYRINGE | INTRAVENOUS | Status: DC | PRN
  Administered 2023-03-30: 160 ug via INTRAVENOUS

## 2023-03-30 MED ORDER — ONDANSETRON HCL 4 MG/2ML IJ SOLN
INTRAMUSCULAR | Status: DC | PRN
  Administered 2023-03-30: 4 mg via INTRAVENOUS

## 2023-03-30 SURGICAL SUPPLY — 35 items

## 2023-03-30 NOTE — Interval H&P Note (Signed)
History and Physical Interval Note:  03/30/2023 7:04 AM  Kristin Lowe  has presented today for surgery, with the diagnosis of HYPERPARATHYROIDISM.  The various methods of treatment have been discussed with the patient and family. After consideration of risks, benefits and other options for treatment, the patient has consented to    Procedure(s) with comments: LEFT INFERIOR PARATHYROIDECTOMY POSSIBLE NECK EXPLORATION (Left) - 2ND SCRUB PERSON FS as a surgical intervention.    The patient's history has been reviewed, patient examined, no change in status, stable for surgery.  I have reviewed the patient's chart and labs.  Questions were answered to the patient's satisfaction.    [name], MD [facility] practice Office: [phone]   [name]

## 2023-03-30 NOTE — Anesthesia Procedure Notes (Signed)
Procedure Name: Intubation Date/Time: 03/30/2023 7:25 AM  Performed by: Florene Route, CRNAPre-anesthesia Checklist: Patient identified, Emergency Drugs available, Suction available and Patient being monitored Patient Re-evaluated:Patient Re-evaluated prior to induction Oxygen Delivery Method: Circle system utilized Preoxygenation: Pre-oxygenation with 100% oxygen Induction Type: IV induction Ventilation: Mask ventilation without difficulty Laryngoscope Size: Miller and 2 Grade View: Grade I Tube type: Reinforced Tube size: 7.0 mm Number of attempts: 1 Airway Equipment and Method: Stylet Placement Confirmation: ETT inserted through vocal cords under direct vision, positive ETCO2 and breath sounds checked- equal and bilateral Secured at: 20 cm Tube secured with: Tape Dental Injury: Teeth and Oropharynx as per pre-operative assessment

## 2023-03-30 NOTE — Transfer of Care (Signed)
Immediate Anesthesia Transfer of Care Note  Patient: Kristin Lowe  Procedure(s) Performed: LEFT INFERIOR PARATHYROIDECTOMY (Left)  Patient Location: PACU  Anesthesia Type:General  Level of Consciousness: drowsy  Airway & Oxygen Therapy: Patient Spontanous Breathing and Patient connected to face mask oxygen  Post-op Assessment: Report given to RN and Post -op Vital signs reviewed and stable  Post vital signs: Reviewed and stable  Last Vitals:  Vitals Value Taken Time  BP 171/83 03/30/23 0827  Temp    Pulse 60 03/30/23 0829  Resp 21 03/30/23 0829  SpO2 100 % 03/30/23 0829  Vitals shown include unvalidated device data.  Last Pain:  Vitals:   03/30/23 0629  TempSrc:   PainSc: 2          Complications: No notable events documented.

## 2023-03-30 NOTE — Op Note (Signed)
OPERATIVE REPORT - PARATHYROIDECTOMY  Preoperative diagnosis: Primary hyperparathyroidism  Postop diagnosis: Same  Procedure: Left inferior minimally invasive parathyroidectomy  Surgeon:  [name], MD  Assistant:  Saunders Glance, PA-C  Anesthesia: General endotracheal  Estimated blood loss: Minimal  Preparation: ChloraPrep  Indications: Patient returns for surgical follow-up and to review the results of testing for evaluation of suspected primary hyperparathyroidism and hypercalcemia. Patient underwent an ultrasound examination in early December 2023. This demonstrated a 7 mm oval hypoechoic nodule along the inferior aspect of the left thyroid lobe. This was felt to represent a left inferior parathyroid gland. However, nuclear medicine sestamibi scan failed to localize a parathyroid adenoma. Therefore the patient underwent a 4D CT scan of the neck with parathyroid protocol on December 01, 2022. 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 Laboratory studies showed a PTH level of 46 which is unsuppressed given her hypercalcemia. Patient does have osteoporosis and has been discussing treatment options with her primary care physician, Dr. Rodrigo Ran. Patient has a history of atrial fibrillation and is currently on Eliquis. She will require cardiac clearance prior to scheduling any surgical intervention.   Procedure: The patient was prepared in the pre-operative holding area. The patient was brought to the operating room and placed in a supine position on the operating room table. Following administration of general anesthesia, the patient was positioned and then prepped and draped in the usual strict aseptic fashion. After ascertaining that an adequate level of anesthesia been achieved, a neck incision was made with a #15 blade. Dissection was carried through subcutaneous tissues and platysma. Hemostasis was obtained with the  electrocautery. Skin flaps were developed circumferentially and a Weitlander retractor was placed for exposure.  Strap muscles were incised in the midline. Strap muscles were reflected laterally exposing the thyroid lobe. With gentle blunt dissection the thyroid lobe was mobilized.  Dissection was carried posteriorly and inferiorly and an enlarged parathyroid gland was identified. It was gently mobilized. Vascular structures were divided between small ligaclips. Care was taken to avoid the recurrent laryngeal nerve. The parathyroid gland was completely excised. It was submitted to pathology where frozen section confirmed hypercellular parathyroid tissue consistent with adenoma.  Further dissection on the left revealed the inferior thyroid artery.  No abnormally enlarge parathyroid tissue was identified superiorly on the left.  Neck was irrigated with warm saline and good hemostasis was noted. Fibrillar was placed in the operative field. Strap muscles were approximated in the midline with interrupted 3-0 Vicryl sutures. Platysma was closed with interrupted 3-0 Vicryl sutures. Marcaine was infiltrated circumferentially. Skin was closed with a running 4-0 Monocryl subcuticular suture. Wound was washed and dried and Dermabond was applied. Patient was awakened from anesthesia and brought to the recovery room. The patient tolerated the procedure well.   [name], MD [facility] Office: [phone]

## 2023-03-30 NOTE — Discharge Instructions (Addendum)
[facility] [place] [facility] - Dr. Todd Gerkin  THYROID & PARATHYROID SURGERY:  POST-OP INSTRUCTIONS  Always review the instruction sheet provided by the hospital nurse at discharge.  A prescription for pain medication may be sent to your pharmacy at the time of discharge.  Take your pain medication as prescribed.  If narcotic pain medicine is not needed, then you may take acetaminophen (Tylenol) or ibuprofen (Advil) as needed for pain or soreness.  Take your normal home medications as prescribed unless otherwise directed.  If you need a refill on your pain medication, please contact the office during regular business hours.  Prescriptions will not be processed by the office after 5:00PM or on weekends.  Start with a light diet upon arrival home, such as soup and crackers or toast.  Be sure to drink plenty of fluids.  Resume your normal diet the day after surgery.  Most patients will experience some swelling and bruising on the chest and neck area.  Ice packs will help for the first 48 hours after arriving home.  Swelling and bruising will take several days to resolve.   It is common to experience some constipation after surgery.  Increasing fluid intake and taking a stool softener (Colace) will usually help to prevent this problem.  A mild laxative (Milk of Magnesia or Miralax) should be taken according to package directions if there has been no bowel movement after 48 hours.  Dermabond glue covers your incision. This seals the wound and you may shower at any time. The Dermabond will remain in place for about a week.  You may gradually remove the glue when it loosens around the edges.  If you need to loosen the Dermabond for removal, apply a layer of Vaseline to the wound for 15 minutes and then remove with a Kleenex. Your sutures are under the skin and will not show - they will dissolve on their own.  You may resume light daily activities beginning the day after discharge (such as self-care,  walking, climbing stairs), gradually increasing activities as tolerated. You may have sexual intercourse when it is comfortable. Refrain from any heavy lifting or straining until approved by your doctor. You may drive when you no longer are taking prescription pain medication, you can comfortably wear a seatbelt, and you can safely maneuver your car and apply the brakes.  You will see your doctor in the office for a follow-up appointment approximately three weeks after your surgery.  Make sure that you call for this appointment within a day or two after you arrive home to insure a convenient appointment time. Please have any requested laboratory tests performed a few days prior to your office visit so that the results will be available at your follow up appointment.  WHEN TO CALL THE [facility] OFFICE: -- Fever greater than 101.5 -- Inability to urinate -- Nausea and/or vomiting - persistent -- Extreme swelling or bruising -- Continued bleeding from incision -- Increased pain, redness, or drainage from the incision -- Difficulty swallowing or breathing -- Muscle cramping or spasms -- Numbness or tingling in hands or around lips  The clinic staff is available to answer your questions during regular business hours.  Please don't hesitate to call and ask to speak to one of the nurses if you have concerns.  [facility] OFFICE: [phone] (24 hours)  Please sign up for MyChart accounts. This will allow you to communicate directly with my nurse or myself without having to call the office. It will also allow you   to view your test results. You will need to enroll in MyChart for my office ([facility]) and for the hospital ([place]).  Todd Gerkin, MD [facility] A [facility] practice 

## 2023-03-30 NOTE — Anesthesia Postprocedure Evaluation (Signed)
Anesthesia Post Note  Patient: Kristin Lowe  Procedure(s) Performed: LEFT INFERIOR PARATHYROIDECTOMY (Left)     Patient location during evaluation: PACU Anesthesia Type: General Level of consciousness: awake and alert Pain management: pain level controlled Vital Signs Assessment: post-procedure vital signs reviewed and stable Respiratory status: spontaneous breathing, nonlabored ventilation and respiratory function stable Cardiovascular status: blood pressure returned to baseline Postop Assessment: no apparent nausea or vomiting Anesthetic complications: no   No notable events documented.  Last Vitals:  Vitals:   03/30/23 0853 03/30/23 0900  BP:  (!) 170/77  Pulse: (!) 58 (!) 53  Resp: 14 17  Temp:    SpO2: 97% 96%    Last Pain:  Vitals:   03/30/23 0850  TempSrc:   PainSc: Asleep                 Shanda Howells

## 2023-03-31 ENCOUNTER — Encounter (HOSPITAL_COMMUNITY): Payer: Self-pay | Admitting: Surgery

## 2023-03-31 LAB — SURGICAL PATHOLOGY

## 2023-04-02 DIAGNOSIS — M6281 Muscle weakness (generalized): Secondary | ICD-10-CM | POA: Diagnosis not present

## 2023-04-03 DIAGNOSIS — Z9889 Other specified postprocedural states: Secondary | ICD-10-CM | POA: Diagnosis not present

## 2023-04-03 DIAGNOSIS — Z9089 Acquired absence of other organs: Secondary | ICD-10-CM | POA: Diagnosis not present

## 2023-04-08 DIAGNOSIS — M6281 Muscle weakness (generalized): Secondary | ICD-10-CM | POA: Diagnosis not present

## 2023-04-09 DIAGNOSIS — L820 Inflamed seborrheic keratosis: Secondary | ICD-10-CM | POA: Diagnosis not present

## 2023-04-09 DIAGNOSIS — L570 Actinic keratosis: Secondary | ICD-10-CM | POA: Diagnosis not present

## 2023-04-09 DIAGNOSIS — L821 Other seborrheic keratosis: Secondary | ICD-10-CM | POA: Diagnosis not present

## 2023-04-09 DIAGNOSIS — D225 Melanocytic nevi of trunk: Secondary | ICD-10-CM | POA: Diagnosis not present

## 2023-04-09 DIAGNOSIS — D2262 Melanocytic nevi of left upper limb, including shoulder: Secondary | ICD-10-CM | POA: Diagnosis not present

## 2023-04-09 DIAGNOSIS — D2261 Melanocytic nevi of right upper limb, including shoulder: Secondary | ICD-10-CM | POA: Diagnosis not present

## 2023-04-10 ENCOUNTER — Ambulatory Visit: Payer: Medicare Other | Attending: Cardiovascular Disease | Admitting: Cardiovascular Disease

## 2023-04-10 VITALS — BP 172/80 | HR 63 | Ht 66.0 in | Wt 173.8 lb

## 2023-04-10 DIAGNOSIS — I10 Essential (primary) hypertension: Secondary | ICD-10-CM | POA: Diagnosis not present

## 2023-04-10 DIAGNOSIS — I480 Paroxysmal atrial fibrillation: Secondary | ICD-10-CM | POA: Diagnosis not present

## 2023-04-10 DIAGNOSIS — Z8673 Personal history of transient ischemic attack (TIA), and cerebral infarction without residual deficits: Secondary | ICD-10-CM | POA: Diagnosis not present

## 2023-04-10 DIAGNOSIS — D6869 Other thrombophilia: Secondary | ICD-10-CM | POA: Diagnosis not present

## 2023-04-10 DIAGNOSIS — G4733 Obstructive sleep apnea (adult) (pediatric): Secondary | ICD-10-CM | POA: Diagnosis not present

## 2023-04-10 DIAGNOSIS — E210 Primary hyperparathyroidism: Secondary | ICD-10-CM

## 2023-04-10 DIAGNOSIS — M6281 Muscle weakness (generalized): Secondary | ICD-10-CM | POA: Diagnosis not present

## 2023-04-10 NOTE — Progress Notes (Unsigned)
Cardiology Office Note    Date:  04/10/2023   ID:  TAGAN MCKINNEY, DOB [birth year], MRN [id number]  PCP:  Rodrigo Ran, MD  Cardiologist:  Nicki Guadalajara, MD (sleep); Dr. Steffanie Dunn, EP; Dr. Cristal Deer  One year F/U sleep evaluation sleep evaluation  History of Present Illness:  Kristin Lowe is a 82 y.o. female who was seen by me for her initail sleep evaluation with me on May 3/12023. She presents for a one year evaluation.  Kristin Brumbley remotely was seen by Dr. Graciela Husbands and most recently has establish PCP care with Dr. Steffanie Dunn.  At his visit he has a history of paroxysmal atrial fibrillation, remote CVA, hypertension, and is status post atrial fibrillation ablation in October 2022.  Patient also had loop recorder which recently was removed by Dr. Lalla Brothers.  Due to concerns for obstructive sleep apnea with symptoms of snoring, daytime sleepiness, fatigue and PAF, she had undergone an initial home sleep study which was done on July 09, 2021.  At that time, her Epworth Sleepiness Scale score was 16.  She was found to have moderate overall sleep apnea with an AHI of 20.3/h with a significant positional component with supine sleep AHI at 35.7/h.  She subsequently underwent a CPAP titration study on September 09, 2021 where CPAP was initiated and titrated to 17 cm but due to continued events was transition to BiPAP up to 22 over 18 cm of water.  Was initially recommended that she have a trial of BiPAP auto therapy with an EPAP min of 15, pressure support of 4, with maximum IPAP of 25 with humidification.  Due to supply chain issues, she did not receive her BiPAP unit until February 04, 2022.  [facility] as her DME company.  She has been using an AirFit F20 medium size mask.  She has had significant difficulty with the mask particularly causing irritation to the bridge of her nose.  She admits to getting dry with therapy and actually had self adjusted humidification to maximum which was  in excess.  A download was obtained from February 25, 2022 through Mar 26, 2022.  She is meeting compliance standards with usage every day averaging 8 hours per night.  Her 95th percentile pressure is 19.4/15.4 with a maximum average pressure at 20.0/16.0.  AHI is 0.8/h.  She does feel improved since initiating CPAP therapy.  A new Epworth Sleepiness Scale score was calculated in the office today and although improved it was still consistent with mild residual daytime sleepiness with a score of 11.  She denies any restless legs, bruxism, sleep paralysis, or cataplectic events.  She presents for her initial evaluation.   Past Medical History:  Diagnosis Date   Arthritis    Atrial fibrillation (HCC)    Bradycardia    Heart murmur    History of kidney stones    Hypertension    pt denies htn at preop on 02/10/23   Paroxysmal atrial fibrillation (HCC)    Pre-diabetes    Sleep apnea    bipap   Stroke (cerebrum) [facility])     Past Surgical History:  Procedure Laterality Date   ANAL RECTAL MANOMETRY N/A 04/16/2022   Procedure: ANO RECTAL MANOMETRY;  Surgeon: Tressia Danas, MD;  Location: [facility] ENDOSCOPY;  Service: Gastroenterology;  Laterality: N/A;   ATRIAL FIBRILLATION ABLATION N/A 09/17/2021   Procedure: ATRIAL FIBRILLATION ABLATION;  Surgeon: Lanier Prude, MD;  Location: [facility] INVASIVE CV LAB;  Service: Cardiovascular;  Laterality: N/A;  CATARACT EXTRACTION, BILATERAL     LOOP RECORDER INSERTION N/A 05/17/2018   Procedure: LOOP RECORDER INSERTION;  Surgeon: Duke Salvia, MD;  Location: [facility] INVASIVE CV LAB;  Service: Cardiovascular;  Laterality: N/A;   LOOP RECORDER INSERTION  05/17/2018   Procedure: LOOP RECORDER INSERTION;  Surgeon: Pricilla Riffle, MD;  Location: [facility] ENDOSCOPY;  Service: Cardiovascular;;   PARATHYROIDECTOMY Left 03/30/2023   Procedure: LEFT INFERIOR PARATHYROIDECTOMY;  Surgeon: [name], MD;  Location: [facility] ORS;  Service: General;  Laterality: Left;  2ND SCRUB PERSON FS    TEE WITHOUT CARDIOVERSION N/A 05/17/2018   Procedure: TRANSESOPHAGEAL ECHOCARDIOGRAM (TEE);  Surgeon: Pricilla Riffle, MD;  Location: [facility] ENDOSCOPY;  Service: Cardiovascular;  Laterality: N/A;   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   Procedure: TOTAL KNEE ARTHROPLASTY;  Surgeon: Eugenia Mcalpine, MD;  Location: [facility] ORS;  Service: Orthopedics;  Laterality: Left;  with adductor canal   TRIGGER FINGER RELEASE     TUMOR REMOVAL Right    Right hand ganglion removed    Current Medications: Outpatient Medications Prior to Visit  Medication Sig Dispense Refill   acetaminophen (TYLENOL) 500 MG tablet Take 500 mg by mouth every 6 (six) hours as needed. Prn     apixaban (ELIQUIS) 5 MG TABS tablet Take 1 tablet (5 mg total) by mouth 2 (two) times daily. 180 tablet 1   ascorbic acid (VITAMIN C) 500 MG tablet Take 500 mg by mouth daily.     atorvastatin (LIPITOR) 40 MG tablet Take 1 tablet (40 mg total) by mouth daily at 6 PM. 30 tablet 3   Coenzyme Q10 (COQ-10) 100 MG CAPS Take 100 mg by mouth daily.     estradiol (ESTRACE) 0.1 MG/GM vaginal cream Place 1 Applicatorful vaginally 2 (two) times a week. Pt stopped 01/19/23     azelastine (ASTELIN) 0.1 % nasal spray Place into both nostrils 2 (two) times daily. Use in each nostril as directed     bisacodyl (DULCOLAX) 5 MG EC tablet Take 1 tablet (5 mg total) by mouth daily as needed for moderate constipation. (Patient not taking: Reported on 02/04/2023) 30 tablet 0   cholecalciferol (VITAMIN D3) 25 MCG (1000 UNIT) tablet Take 1,000 Units by mouth daily.     clobetasol ointment (TEMOVATE) 0.05 % Apply 1 application topically daily as needed (skin irritation).     doxycycline (VIBRA-TABS) 100 MG tablet Take 100 mg by mouth 2 (two) times daily.     Magnesium 250 MG TABS Take 250 mg by mouth daily.     methocarbamol (ROBAXIN) 500 MG tablet Take 1 tablet (500 mg total) by mouth every 6 (six) hours as needed for muscle spasms. (Patient not taking: Reported on 02/04/2023)  60 tablet 0   ondansetron (ZOFRAN) 4 MG tablet Take 1 tablet (4 mg total) by mouth every 6 (six) hours as needed for nausea. (Patient not taking: Reported on 02/04/2023) 20 tablet 0   oxyCODONE (OXY IR/ROXICODONE) 5 MG immediate release tablet Take 1-2 tablets (5-10 mg total) by mouth every 4 (four) hours as needed for moderate pain or severe pain (pain score 4-6). (Patient not taking: Reported on 02/04/2023) 50 tablet 0   traMADol (ULTRAM) 50 MG tablet Take 1 tablet (50 mg total) by mouth every 6 (six) hours as needed for moderate pain. (Patient not taking: Reported on 02/04/2023) 50 tablet 0   traMADol (ULTRAM) 50 MG tablet Take 1 tablet (50 mg total) by mouth every 6 (six) hours as needed for moderate pain. 15 tablet  0   triamcinolone (NASACORT ALLERGY 24HR) 55 MCG/ACT AERO nasal inhaler Place 2 sprays into the nose daily as needed (allergies).     No facility-administered medications prior to visit.     Allergies:   Patient has no known allergies.   Social History   Socioeconomic History   Marital status: Single    Spouse name: Not on file   Number of children: Not on file   Years of education: Not on file   Highest education level: Not on file  Occupational History   Not on file  Tobacco Use   Smoking status: Former    Packs/day: 2.00    Years: 30.00    Additional pack years: 0.00    Total pack years: 60.00    Types: Cigarettes    Quit date: 11/24/1990    Years since quitting: 32.3   Smokeless tobacco: Never  Vaping Use   Vaping Use: Never used  Substance and Sexual Activity   Alcohol use: Yes    Comment: occ wine or beer   Drug use: Never   Sexual activity: Not Currently  Other Topics Concern   Not on file  Social History Narrative   Not on file   Social Determinants of Health   Financial Resource Strain: Not on file  Food Insecurity: Not on file  Transportation Needs: Not on file  Physical Activity: Not on file  Stress: Not on file  Social Connections: Not on file     Socially, she was born in [place].  She was raised in [place] and follows the [employer].  There is remote tobacco history.  Family History:  The patient's family history includes Breast cancer in her mother; Diabetes in her brother and mother; Heart disease in her father; Hypertension in her brother and sister; Thyroid disease in her mother and sister.   ROS General: Negative; No fevers, chills, or night sweats;  HEENT: Negative; No changes in vision or hearing, sinus congestion, difficulty swallowing Pulmonary: Negative; No cough, wheezing, shortness of breath, hemoptysis Cardiovascular: History of A-fib, status post ablation, GI: Negative; No nausea, vomiting, diarrhea, or abdominal pain GU: Negative; No dysuria, hematuria, or difficulty voiding Musculoskeletal: Negative; no myalgias, joint pain, or weakness Hematologic/Oncology: Negative; no easy bruising, bleeding Endocrine: Negative; no heat/cold intolerance; no diabetes Neuro: Remote TIA, Skin: Negative; No rashes or skin lesions Psychiatric: Negative; No behavioral problems, depression Sleep: OSA, see HPI Other comprehensive 14 point system review is negative.   PHYSICAL EXAM:   VS:  BP (!) 172/80 (BP Location: Left Arm, Patient Position: Sitting, Cuff Size: Normal)   Pulse 63   Ht 5\' 6"  (1.676 m)   Wt 173 lb 12.8 oz (78.8 kg)   SpO2 97%   BMI 28.05 kg/m     Repeat blood pressure by me was 130/74  Wt Readings from Last 3 Encounters:  04/10/23 173 lb 12.8 oz (78.8 kg)  03/30/23 172 lb 12.8 oz (78.4 kg)  03/20/23 168 lb 14 oz (76.6 kg)    General: Alert, oriented, no distress.  Skin: normal turgor, no rashes, warm and dry HEENT: Normocephalic, atraumatic. Pupils equal round and reactive to light; sclera anicteric; extraocular muscles intact;  Nose without nasal septal hypertrophy Mouth/Parynx benign; Mallinpatti scale 3 Neck: No JVD, no carotid bruits; normal carotid upstroke Lungs: clear to  ausculatation and percussion; no wheezing or rales Chest wall: without tenderness to palpitation Heart: PMI not displaced, RRR, s1 s2 normal, 1/6 systolic murmur, no diastolic murmur, no  rubs, gallops, thrills, or heaves Abdomen: soft, nontender; no hepatosplenomehaly, BS+; abdominal aorta nontender and not dilated by palpation. Back: no CVA tenderness Pulses 2+ Musculoskeletal: full range of motion, normal strength, no joint deformities Extremities: no clubbing cyanosis or edema, Homan's sign negative  Neurologic: grossly nonfocal; Cranial nerves grossly wnl Psychologic: Normal mood and affect   Studies/Labs Reviewed:   Apr 10, 2023 ECG (independently read by me): NST at 63, left axis deviation, nonspecific ST changes  Mar 26, 2022 ECG (independently read by me): Sinus bradycardia at 54 with mild sinus arrythmia, baseline wander  Recent Labs:    Latest Ref Rng & Units 03/20/2023   10:26 AM 02/10/2023    8:55 AM 07/02/2022    1:33 PM  BMP  Glucose 70 - 99 mg/dL 97  295    BUN 8 - 23 mg/dL 13  16    Creatinine 6.21 - 1.00 mg/dL 3.08  6.57  8.46   Sodium 135 - 145 mmol/L 139  140    Potassium 3.5 - 5.1 mmol/L 4.5  3.7    Chloride 98 - 111 mmol/L 106  107    CO2 22 - 32 mmol/L 24  26    Calcium 8.9 - 10.3 mg/dL 96.2  95.2          Latest Ref Rng & Units 07/12/2021   12:14 PM 05/30/2021    2:55 PM 05/15/2018    4:49 PM  Hepatic Function  Total Protein 6.0 - 8.5 g/dL 6.7  7.2  7.2   Albumin 3.7 - 4.7 g/dL 4.5  4.7  4.2   AST 0 - 40 IU/L 19  21  28    ALT 0 - 32 IU/L 19  22  25    Alk Phosphatase 44 - 121 IU/L 76  81  65   Total Bilirubin 0.0 - 1.2 mg/dL 0.5  0.3  0.4        Latest Ref Rng & Units 03/20/2023   10:26 AM 02/10/2023    8:55 AM 07/02/2022    1:33 PM  CBC  WBC 4.0 - 10.5 K/uL 5.6  5.6  6.4   Hemoglobin 12.0 - 15.0 g/dL 84.1  32.4  40.1   Hematocrit 36.0 - 46.0 % 38.4  41.2  40.4   Platelets 150 - 400 K/uL 285  286  233    Lab Results  Component Value Date   MCV  91.6 03/20/2023   MCV 90.7 02/10/2023   MCV 94.4 07/02/2022   Lab Results  Component Value Date   TSH 0.564 07/12/2021   Lab Results  Component Value Date   HGBA1C 6.1 (H) 02/10/2023     BNP No results found for: "BNP"  ProBNP No results found for: "PROBNP"   Lipid Panel     Component Value Date/Time   CHOL 182 05/15/2018 2119   TRIG 115 05/15/2018 2119   HDL 53 05/15/2018 2119   CHOLHDL 3.4 05/15/2018 2119   VLDL 23 05/15/2018 2119   LDLCALC 106 (H) 05/15/2018 2119     RADIOLOGY: No results found.   Additional studies/ records that were reviewed today include:    09/09/2021 CLINICAL INFORMATION The patient is referred for a BiPAP titration to treat sleep apnea.   Date of HST:  07/09/2021:  AHI 20.3/h; supine AHI 35.7/h; O2 nadir 90%.   SLEEP STUDY TECHNIQUE As per the AASM Manual for the Scoring of Sleep and Associated Events v2.3 (April 2016) with a hypopnea requiring 4% desaturations.  The channels recorded and monitored were frontal, central and occipital EEG, electrooculogram (EOG), submentalis EMG (chin), nasal and oral airflow, thoracic and abdominal wall motion, anterior tibialis EMG, snore microphone, electrocardiogram, and pulse oximetry. Bilevel positive airway pressure (BPAP) was initiated at the beginning of the study and titrated to treat sleep-disordered breathing.   MEDICATIONS acetaminophen (TYLENOL) 325 MG tablet apixaban (ELIQUIS) 5 MG TABS tablet atorvastatin (LIPITOR) 40 MG tablet cholecalciferol (VITAMIN D3) 25 MCG (1000 UNIT) tablet clobetasol ointment (TEMOVATE) 0.05 % co-enzyme Q-10 50 MG capsule DHA-EPA-Vitamin E (OMEGA-3 COMPLEX PO) famotidine (PEPCID) 20 MG tablet Multiple Vitamins-Minerals (IMMUNE SUPPORT PO) OVER THE COUNTER MEDICATION pantoprazole (PROTONIX) 40 MG tablet Triamcinolone Acetonide (NASACORT AQ NA) Turmeric 500 MG CAPS verapamil (CALAN) 40 MG tablet  Medications self-administered by patient taken the night  of the study : N/A   RESPIRATORY PARAMETERS Optimal IPAP Pressure (cm): 22        AHI at Optimal Pressure (/hr) 2.5 Optimal EPAP Pressure (cm):            18           Overall Minimal O2 (%):         92.0     Minimal O2 at Optimal Pressure (%): 95.0   SLEEP ARCHITECTURE Start Time:      10:26:53 PM    Stop Time:       5:03:10 AM      Total Time (min):         396.3   Total Sleep Time (min):      315 Sleep Latency (min):   9.6       Sleep Efficiency (%):   79.5%  REM Latency (min):    75.0     WASO (min):  71.7 Stage N1 (%): 13.2%  Stage N2 (%): 65.4%  Stage N3 (%): 0.0%    Stage R (%):   21.4 Supine (%):     71.75   Arousal Index (/hr):     38.3        CARDIAC DATA The 2 lead EKG demonstrated sinus rhythm. The mean heart rate was 46.5 beats per minute. Other EKG findings include: PVCs.   LEG MOVEMENT DATA The total Periodic Limb Movements of Sleep (PLMS) were 0. The PLMS index was 0.0. A PLMS index of <15 is considered normal in adults.   IMPRESSIONS - CPAP was initiated at 5 cm, titrated to 17 cm and transitioned to BiPAP up to 22/18 cm of water (AHI 2.5/ RDI 3.3, O2 nadir of 95.0%.   - Central sleep apnea was not noted during this titration (CAI = 0.8/h). - Significant oxygen desaturations were not observed during this titration (min O2 9 2.0%). - The patient snored with moderate snoring volume. - 2-lead EKG demonstrated: PVCs - Clinically significant periodic limb movements were not noted during this study. Arousals associated with PLMs were rare.   DIAGNOSIS - Obstructive Sleep Apnea (G47.33)   RECOMMENDATIONS - Recommend an initial trial of BiPAP Auto with EPAP min of 15, pressure support of 4, and IPAP max of 25 with heated humidification.  A Medium size Resmed Full Face Mask AirFit F20 mask was used for the titration.  - Effort should be made to optimize nasal and oropharyngeal patency. - Avoid alcohol, sedatives and other CNS depressants that may worsen sleep apnea and  disrupt normal sleep architecture. - Sleep hygiene should be reviewed to assess factors that may improve sleep quality. - Weight management and regular exercise should be  initiated or continued. - Recommend a download in 30 days and sleep clinic evaluation after 4 weeks of therapy.    ASSESSMENT:    No diagnosis found.   PLAN:  Kristin. Hilinai Lowe is a very pleasant 82 year old female who has a history of paroxysmal atrial fibrillation, remote TIA/CVA, hypertension, and underwent atrial fibrillation ablation in October 2022 by Dr. Lalla Brothers.  Remotely she had worn a loop monitor following her TIA/CVA which was felt to be due to atrial fibrillation.  She has been diagnosed with moderate overall sleep apnea and had an AHI of 20.3 on her initial home study with a very severe in the supine position with AHI at 35.70/h.  On CPAP titration study she was titrated up to 17 cm and continued to have events and as result was transition to BiPAP therapy.  She has now been on BiPAP with her ResMed air curve 10 via auto unit currently set at a minimum EPAP of 15, pressure support of 4 and maximum IPAP of 25 cm of water.  She has been on therapy since February 04, 2022.  Compliance is excellent with 100% use and average use at 8 hours per night.  AHI is 0.8 and her 95th percentile pressure is 19.4/15.4 with maximum average pressure at 20/16.  I had a long discussion with her today in the office regarding sleep apnea.  I discussed this with locations which undoubtedly played a role with her development of atrial fibrillation.  In addition we discussed its effect on blood pressure, increased potential for nocturnal arrhythmias, increased inflammation, insulin resistance, effects on GERD, pathophysiology associated with frequent nocturia, and also potential for nocturnal hypoxemia contributing to ischemia.  I commended her on her excellent use.  However she has been having some difficulty with her mask particular involving  tenderness over the bridge of her nose.  She has been using an AirFit F20 medium size mask.  I provided her a sample of a new ResMed AirFit F30i mask which I believe she will like much better and particularly with the tubing coming to the crown of the head this should allow for more flexibility with her sleep without to be getting into the way.  I am changing her ramp start EPAP pressure to 6.  I answered all her questions.  Her blood pressure today is stable at 130/74.  She is on atorvastatin 40 mg for hyperlipidemia and she continues to be anticoagulated with Eliquis 5 mg twice a day.  She is on pantoprazole for GERD.  Time spent: 40 minutes Medication Adjustments/Labs and Tests Ordered: Current medicines are reviewed at length with the patient today.  Concerns regarding medicines are outlined above.  Medication changes, Labs and Tests ordered today are listed in the Patient Instructions below. Patient Instructions  Medication Instructions:  *** *If you need a refill on your cardiac medications before your next appointment, please call your pharmacy*   Lab Work: *** If you have labs (blood work) drawn today and your tests are completely normal, you will receive your results only by: MyChart Message (if you have MyChart) OR A paper copy in the mail If you have any lab test that is abnormal or we need to change your treatment, we will call you to review the results.   Testing/Procedures: ***   Follow-Up: At [facility], you and your health needs are our priority.  As part of our continuing mission to provide you with exceptional heart care, we have created designated Provider Care  Teams.  These Care Teams include your primary Cardiologist (physician) and Advanced Practice Providers (APPs -  Physician Assistants and Nurse Practitioners) who all work together to provide you with the care you need, when you need it.  We recommend signing up for the patient portal called "MyChart".   Sign up information is provided on this After Visit Summary.  MyChart is used to connect with patients for Virtual Visits (Telemedicine).  Patients are able to view lab/test results, encounter notes, upcoming appointments, etc.  Non-urgent messages can be sent to your provider as well.   To learn more about what you can do with MyChart, go to [email or web address].    Your next appointment:   {numbers 1-12:[id number]} {Time; day/wk/mo/yr(s):9076}  Provider:   {Providers/Teams        :[id number]} {If Card or EP not listed click to update   DO NOT delete brackets or number around this link :1}   Other Instructions ***    Signed, Nicki Guadalajara, MD, [facility], ABSM Diplomate, American Board of Sleep Medicine  04/10/2023 10:49 AM    [facility] [address], [address], [place], [place]  [address] Phone: [phone]

## 2023-04-10 NOTE — Patient Instructions (Signed)
Medication Instructions:  Your physician recommends that you continue on your current medications as directed. Please refer to the Current Medication list given to you today.  *If you need a refill on your cardiac medications before your next appointment, please call your pharmacy*   Follow-Up: At [facility], you and your health needs are our priority.  As part of our continuing mission to provide you with exceptional heart care, we have created designated Provider Care Teams.  These Care Teams include your primary Cardiologist (physician) and Advanced Practice Providers (APPs -  Physician Assistants and Nurse Practitioners) who all work together to provide you with the care you need, when you need it.  We recommend signing up for the patient portal called "MyChart".  Sign up information is provided on this After Visit Summary.  MyChart is used to connect with patients for Virtual Visits (Telemedicine).  Patients are able to view lab/test results, encounter notes, upcoming appointments, etc.  Non-urgent messages can be sent to your provider as well.   To learn more about what you can do with MyChart, go to [email or web address].    Your next appointment:   12 month(s)  Provider:   Thomas Kelly, MD  Other Instructions Sleep Clinic  

## 2023-04-14 DIAGNOSIS — M6281 Muscle weakness (generalized): Secondary | ICD-10-CM | POA: Diagnosis not present

## 2023-04-16 DIAGNOSIS — M6281 Muscle weakness (generalized): Secondary | ICD-10-CM | POA: Diagnosis not present

## 2023-04-16 NOTE — Telephone Encounter (Signed)
**Note De-Identified Kristin Lowe Obfuscation** Letter received from [facility] [name] fax stating that they have approved the pt for Eliquis assistance until 11/24/2023. [id number]  The letter states that they have notified the pt of this approval as well.

## 2023-04-21 DIAGNOSIS — Z9889 Other specified postprocedural states: Secondary | ICD-10-CM | POA: Diagnosis not present

## 2023-04-21 DIAGNOSIS — E210 Primary hyperparathyroidism: Secondary | ICD-10-CM | POA: Diagnosis not present

## 2023-04-21 DIAGNOSIS — Z9089 Acquired absence of other organs: Secondary | ICD-10-CM | POA: Diagnosis not present

## 2023-04-21 DIAGNOSIS — M6281 Muscle weakness (generalized): Secondary | ICD-10-CM | POA: Diagnosis not present

## 2023-04-28 DIAGNOSIS — M6281 Muscle weakness (generalized): Secondary | ICD-10-CM | POA: Diagnosis not present

## 2023-04-30 DIAGNOSIS — M6281 Muscle weakness (generalized): Secondary | ICD-10-CM | POA: Diagnosis not present

## 2023-05-05 DIAGNOSIS — M6281 Muscle weakness (generalized): Secondary | ICD-10-CM | POA: Diagnosis not present

## 2023-05-07 DIAGNOSIS — M6281 Muscle weakness (generalized): Secondary | ICD-10-CM | POA: Diagnosis not present

## 2023-05-12 DIAGNOSIS — M6281 Muscle weakness (generalized): Secondary | ICD-10-CM | POA: Diagnosis not present

## 2023-05-13 ENCOUNTER — Ambulatory Visit (INDEPENDENT_AMBULATORY_CARE_PROVIDER_SITE_OTHER): Payer: Medicare Other | Admitting: Cardiology

## 2023-05-13 ENCOUNTER — Encounter (HOSPITAL_BASED_OUTPATIENT_CLINIC_OR_DEPARTMENT_OTHER): Payer: Self-pay | Admitting: Cardiology

## 2023-05-13 VITALS — BP 125/57 | HR 57 | Ht 66.0 in | Wt 175.3 lb

## 2023-05-13 DIAGNOSIS — I10 Essential (primary) hypertension: Secondary | ICD-10-CM

## 2023-05-13 DIAGNOSIS — G4733 Obstructive sleep apnea (adult) (pediatric): Secondary | ICD-10-CM

## 2023-05-13 DIAGNOSIS — Z8673 Personal history of transient ischemic attack (TIA), and cerebral infarction without residual deficits: Secondary | ICD-10-CM | POA: Diagnosis not present

## 2023-05-13 DIAGNOSIS — D6869 Other thrombophilia: Secondary | ICD-10-CM

## 2023-05-13 DIAGNOSIS — I480 Paroxysmal atrial fibrillation: Secondary | ICD-10-CM | POA: Diagnosis not present

## 2023-05-13 DIAGNOSIS — Z7901 Long term (current) use of anticoagulants: Secondary | ICD-10-CM | POA: Diagnosis not present

## 2023-05-13 NOTE — Progress Notes (Signed)
  Cardiology Office Note:  .   Date:  05/13/2023  ID:  Kristin Lowe, DOB [birth year], MRN [id number] PCP: Rodrigo Ran, MD  [facility] Providers Cardiologist:  Jodelle Red, MD Electrophysiologist:  Lanier Prude, MD  Sleep Medicine:  Nicki Guadalajara, MD {  History of Present Illness: [name] Kitchen   Kristin Lowe is a 82 y.o. female with a hx of cryptogenic stroke, hypertension, sinus bradycardia, paroxysmal atrial fibrillation on anticoagulation s/p PVI 2022, venous insufficiency who is seen for follow-up. I initially met her as a new consult at the request of Rodrigo Ran, MD for the evaluation and management of PVCs. She has an implantable loop recorder for evaluation of cryptogenic stroke.   Today: Doing well overall. Still has some residual left knee pain 10 mos post op. Wears bipap nightly. Walks 6000 steps/day, working her way back up. 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 Does water aerobics.  ROS: Denies chest pain, shortness of breath at rest or with normal exertion. No PND, orthopnea, LE edema or unexpected weight gain. No syncope or palpitations. ROS otherwise negative except as noted.   Studies Reviewed: [name] Kitchen    EKG:  not ordered today  Echo 05/16/2018: EF 55-60%. Mild MR.  Physical Exam:   VS:  BP (!) 125/57 (BP Location: Left Arm, Patient Position: Sitting, Cuff Size: Normal)   Pulse (!) 57   Ht 5\' 6"  (1.676 m)   Wt 175 lb 4.8 oz (79.5 kg)   SpO2 98%   BMI 28.29 kg/m    Wt Readings from Last 3 Encounters:  05/13/23 175 lb 4.8 oz (79.5 kg)  04/10/23 173 lb 12.8 oz (78.8 kg)  03/30/23 172 lb 12.8 oz (78.4 kg)    GEN: Well nourished, well developed in no acute distress HEENT: Normal, moist mucous membranes NECK: No JVD CARDIAC: regular rhythm, normal S1 and S2, no rubs or gallops. No murmur. VASCULAR: Radial and DP pulses 2+ bilaterally. No carotid bruits RESPIRATORY:  Clear to auscultation without rales, wheezing or rhonchi  ABDOMEN:  Soft, non-tender, non-distended MUSCULOSKELETAL:  Ambulates independently SKIN: Warm and dry, no edema. LLE in compression stocking. NEUROLOGIC:  Alert and oriented x 3. No focal neuro deficits noted. PSYCHIATRIC:  Normal affect    ASSESSMENT AND PLAN: .   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 Tresa Endo   Intermittent LE edema: none today on exam, but pattern fits with chronic venous insufficiency -counseled on compression stockings, elevation, doing well with this   Cardiac risk counseling and prevention recommendations: -recommend heart healthy/Mediterranean diet, with whole grains, fruits, vegetable, fish, lean meats, nuts, and olive oil. Limit salt. -recommend moderate walking, 3-5 times/week for 30-50 minutes each session. Aim for at least 150 minutes.week. Goal should be pace of 3 miles/hours, or walking 1.5 miles in 30 minutes -recommend avoidance of tobacco products. Avoid excess alcohol.  Dispo: 1 year or sooner as needed  Signed, Jodelle Red, MD   Jodelle Red, MD, PhD, [facility]  [facility]  [place]  Heart & Vascular at [facility] at [facility] [address] [place], [place] [address] [phone]

## 2023-05-13 NOTE — Patient Instructions (Signed)
Medication Instructions:  Your physician recommends that you continue on your current medications as directed. Please refer to the Current Medication list given to you today.   *If you need a refill on your cardiac medications before your next appointment, please call your pharmacy*  Lab Work: NONE.  Testing/Procedures: NONE  Follow-Up: At [facility], you and your health needs are our priority.  As part of our continuing mission to provide you with exceptional heart care, we have created designated Provider Care Teams.  These Care Teams include your primary Cardiologist (physician) and Advanced Practice Providers (APPs -  Physician Assistants and Nurse Practitioners) who all work together to provide you with the care you need, when you need it.  We recommend signing up for the patient portal called "MyChart".  Sign up information is provided on this After Visit Summary.  MyChart is used to connect with patients for Virtual Visits (Telemedicine).  Patients are able to view lab/test results, encounter notes, upcoming appointments, etc.  Non-urgent messages can be sent to your provider as well.   To learn more about what you can do with MyChart, go to [email or web address].    Your next appointment:   12 month(s)  Provider:   DR Cristal Deer

## 2023-06-09 DIAGNOSIS — M6281 Muscle weakness (generalized): Secondary | ICD-10-CM | POA: Diagnosis not present

## 2023-06-12 DIAGNOSIS — M6281 Muscle weakness (generalized): Secondary | ICD-10-CM | POA: Diagnosis not present

## 2023-06-16 DIAGNOSIS — M6281 Muscle weakness (generalized): Secondary | ICD-10-CM | POA: Diagnosis not present

## 2023-06-19 ENCOUNTER — Telehealth: Payer: Self-pay | Admitting: Cardiovascular Disease

## 2023-06-19 NOTE — Telephone Encounter (Signed)
Patient ask for local company rather than one from [place].  Advised I could send message to sleep coordinator to see if they can send to another or advise her of another company. She does want this company any longer. Please call or message her thank you

## 2023-06-19 NOTE — Telephone Encounter (Signed)
Patient calling in to see what company she can go with to get, her cpap supplies. Please advise

## 2023-06-22 NOTE — Telephone Encounter (Signed)
Spoke with patient about local DME, previous DME no longer in service. Patient has received some supplies from DME in [place]. She stated if there are any more issues with that DME, she will reach out to Sleep Coordinator to have prescription sent to local DME Christ[facility] Allegra).

## 2023-06-23 DIAGNOSIS — Z4789 Encounter for other orthopedic aftercare: Secondary | ICD-10-CM | POA: Diagnosis not present

## 2023-06-23 DIAGNOSIS — M6281 Muscle weakness (generalized): Secondary | ICD-10-CM | POA: Diagnosis not present

## 2023-06-23 DIAGNOSIS — M25562 Pain in left knee: Secondary | ICD-10-CM | POA: Diagnosis not present

## 2023-06-25 DIAGNOSIS — M6281 Muscle weakness (generalized): Secondary | ICD-10-CM | POA: Diagnosis not present

## 2023-07-03 DIAGNOSIS — R2681 Unsteadiness on feet: Secondary | ICD-10-CM | POA: Diagnosis not present

## 2023-07-03 DIAGNOSIS — D6869 Other thrombophilia: Secondary | ICD-10-CM | POA: Diagnosis not present

## 2023-07-03 DIAGNOSIS — I480 Paroxysmal atrial fibrillation: Secondary | ICD-10-CM | POA: Diagnosis not present

## 2023-07-03 DIAGNOSIS — I6389 Other cerebral infarction: Secondary | ICD-10-CM | POA: Diagnosis not present

## 2023-07-03 DIAGNOSIS — M1712 Unilateral primary osteoarthritis, left knee: Secondary | ICD-10-CM | POA: Diagnosis not present

## 2023-07-03 DIAGNOSIS — J309 Allergic rhinitis, unspecified: Secondary | ICD-10-CM | POA: Diagnosis not present

## 2023-07-03 DIAGNOSIS — R030 Elevated blood-pressure reading, without diagnosis of hypertension: Secondary | ICD-10-CM | POA: Diagnosis not present

## 2023-07-03 DIAGNOSIS — R200 Anesthesia of skin: Secondary | ICD-10-CM | POA: Diagnosis not present

## 2023-07-03 DIAGNOSIS — R29898 Other symptoms and signs involving the musculoskeletal system: Secondary | ICD-10-CM | POA: Diagnosis not present

## 2023-07-03 DIAGNOSIS — R7989 Other specified abnormal findings of blood chemistry: Secondary | ICD-10-CM | POA: Diagnosis not present

## 2023-07-03 DIAGNOSIS — R001 Bradycardia, unspecified: Secondary | ICD-10-CM | POA: Diagnosis not present

## 2023-07-09 DIAGNOSIS — M25562 Pain in left knee: Secondary | ICD-10-CM | POA: Diagnosis not present

## 2023-07-09 DIAGNOSIS — M1712 Unilateral primary osteoarthritis, left knee: Secondary | ICD-10-CM | POA: Diagnosis not present

## 2023-07-23 DIAGNOSIS — Z96652 Presence of left artificial knee joint: Secondary | ICD-10-CM | POA: Diagnosis not present

## 2023-07-23 DIAGNOSIS — M1711 Unilateral primary osteoarthritis, right knee: Secondary | ICD-10-CM | POA: Diagnosis not present

## 2023-08-01 DIAGNOSIS — Z20822 Contact with and (suspected) exposure to covid-19: Secondary | ICD-10-CM | POA: Diagnosis not present

## 2023-08-01 DIAGNOSIS — R051 Acute cough: Secondary | ICD-10-CM | POA: Diagnosis not present

## 2023-08-06 DIAGNOSIS — G4733 Obstructive sleep apnea (adult) (pediatric): Secondary | ICD-10-CM | POA: Diagnosis not present

## 2023-08-06 DIAGNOSIS — J069 Acute upper respiratory infection, unspecified: Secondary | ICD-10-CM | POA: Diagnosis not present

## 2023-08-06 DIAGNOSIS — R051 Acute cough: Secondary | ICD-10-CM | POA: Diagnosis not present

## 2023-08-06 DIAGNOSIS — D6869 Other thrombophilia: Secondary | ICD-10-CM | POA: Diagnosis not present

## 2023-08-06 DIAGNOSIS — R030 Elevated blood-pressure reading, without diagnosis of hypertension: Secondary | ICD-10-CM | POA: Diagnosis not present

## 2023-08-06 DIAGNOSIS — I480 Paroxysmal atrial fibrillation: Secondary | ICD-10-CM | POA: Diagnosis not present

## 2023-08-31 DIAGNOSIS — E663 Overweight: Secondary | ICD-10-CM | POA: Diagnosis not present

## 2023-08-31 DIAGNOSIS — R700 Elevated erythrocyte sedimentation rate: Secondary | ICD-10-CM | POA: Diagnosis not present

## 2023-08-31 DIAGNOSIS — M2559 Pain in other specified joint: Secondary | ICD-10-CM | POA: Diagnosis not present

## 2023-08-31 DIAGNOSIS — Z6828 Body mass index (BMI) 28.0-28.9, adult: Secondary | ICD-10-CM | POA: Diagnosis not present

## 2023-09-07 DIAGNOSIS — E785 Hyperlipidemia, unspecified: Secondary | ICD-10-CM | POA: Diagnosis not present

## 2023-09-07 DIAGNOSIS — G4733 Obstructive sleep apnea (adult) (pediatric): Secondary | ICD-10-CM | POA: Diagnosis not present

## 2023-09-07 DIAGNOSIS — J309 Allergic rhinitis, unspecified: Secondary | ICD-10-CM | POA: Diagnosis not present

## 2023-09-07 DIAGNOSIS — R200 Anesthesia of skin: Secondary | ICD-10-CM | POA: Diagnosis not present

## 2023-09-07 DIAGNOSIS — G720 Drug-induced myopathy: Secondary | ICD-10-CM | POA: Diagnosis not present

## 2023-09-07 DIAGNOSIS — D6869 Other thrombophilia: Secondary | ICD-10-CM | POA: Diagnosis not present

## 2023-09-07 DIAGNOSIS — Z23 Encounter for immunization: Secondary | ICD-10-CM | POA: Diagnosis not present

## 2023-09-07 DIAGNOSIS — R2681 Unsteadiness on feet: Secondary | ICD-10-CM | POA: Diagnosis not present

## 2023-09-07 DIAGNOSIS — M1712 Unilateral primary osteoarthritis, left knee: Secondary | ICD-10-CM | POA: Diagnosis not present

## 2023-09-07 DIAGNOSIS — I480 Paroxysmal atrial fibrillation: Secondary | ICD-10-CM | POA: Diagnosis not present

## 2023-09-07 DIAGNOSIS — R29898 Other symptoms and signs involving the musculoskeletal system: Secondary | ICD-10-CM | POA: Diagnosis not present

## 2023-09-07 DIAGNOSIS — Z8673 Personal history of transient ischemic attack (TIA), and cerebral infarction without residual deficits: Secondary | ICD-10-CM | POA: Diagnosis not present

## 2023-09-14 DIAGNOSIS — Z6828 Body mass index (BMI) 28.0-28.9, adult: Secondary | ICD-10-CM | POA: Diagnosis not present

## 2023-09-14 DIAGNOSIS — R768 Other specified abnormal immunological findings in serum: Secondary | ICD-10-CM | POA: Diagnosis not present

## 2023-09-14 DIAGNOSIS — M25561 Pain in right knee: Secondary | ICD-10-CM | POA: Diagnosis not present

## 2023-09-14 DIAGNOSIS — E663 Overweight: Secondary | ICD-10-CM | POA: Diagnosis not present

## 2023-09-14 DIAGNOSIS — M1991 Primary osteoarthritis, unspecified site: Secondary | ICD-10-CM | POA: Diagnosis not present

## 2023-10-05 DIAGNOSIS — N952 Postmenopausal atrophic vaginitis: Secondary | ICD-10-CM | POA: Diagnosis not present

## 2023-10-05 DIAGNOSIS — Z01419 Encounter for gynecological examination (general) (routine) without abnormal findings: Secondary | ICD-10-CM | POA: Diagnosis not present

## 2023-10-05 DIAGNOSIS — L900 Lichen sclerosus et atrophicus: Secondary | ICD-10-CM | POA: Diagnosis not present

## 2023-10-05 DIAGNOSIS — Z01411 Encounter for gynecological examination (general) (routine) with abnormal findings: Secondary | ICD-10-CM | POA: Diagnosis not present

## 2023-10-05 DIAGNOSIS — Z124 Encounter for screening for malignant neoplasm of cervix: Secondary | ICD-10-CM | POA: Diagnosis not present

## 2023-10-13 DIAGNOSIS — E663 Overweight: Secondary | ICD-10-CM | POA: Diagnosis not present

## 2023-10-13 DIAGNOSIS — Z6828 Body mass index (BMI) 28.0-28.9, adult: Secondary | ICD-10-CM | POA: Diagnosis not present

## 2023-10-13 DIAGNOSIS — R768 Other specified abnormal immunological findings in serum: Secondary | ICD-10-CM | POA: Diagnosis not present

## 2023-10-13 DIAGNOSIS — M1991 Primary osteoarthritis, unspecified site: Secondary | ICD-10-CM | POA: Diagnosis not present

## 2023-10-13 DIAGNOSIS — M25561 Pain in right knee: Secondary | ICD-10-CM | POA: Diagnosis not present

## 2023-10-15 ENCOUNTER — Telehealth: Payer: Self-pay | Admitting: Cardiology

## 2023-10-15 NOTE — Telephone Encounter (Signed)
Will forward to Pharm D for review  

## 2023-10-15 NOTE — Telephone Encounter (Signed)
Left message to call back  

## 2023-10-15 NOTE — Telephone Encounter (Signed)
Pt called in stating her doctor prescribed her Hydroxychloroquine 200mg  to take 2x daily for 3 months. She wants to know if Dr. Cristal Deer is okay with her taking it. Please advise.

## 2023-10-15 NOTE — Telephone Encounter (Signed)
Hydroxychloroquine has the potential for cardiac side effects.  What is she treating?   Cardiovascular: Cardiotoxicity including cardiomyopathy with fatal cases, has been reported;   Cardiovascular: QT prolongation, ventricular arrhythmias, and torsades de points have been reported; monitoring recommended and discontinue use if suspected. Correct electrolyte imbalances prior to treatment [6].  Cardiovascular: Avoid use in patients with congenital or documented acquired QT prolongation and known risk factors for QT prolongation including cardiac disease (eg, heart failure, myocardial infarction), proarrhythmic conditions (eg, bradycardia [less than 50 beats per minute]), history of ventricular dysrhythmias, uncorrected hypokalemia and/or hypomagnesemia, and concomitant administration with QT interval prolonging drugs [6]

## 2023-10-16 ENCOUNTER — Telehealth (HOSPITAL_BASED_OUTPATIENT_CLINIC_OR_DEPARTMENT_OTHER): Payer: Self-pay | Admitting: Cardiology

## 2023-10-16 NOTE — Telephone Encounter (Signed)
Returned call to patient. Her rheumatologist prescribed hydroxychloroquine for her knee muscle pain. She went to her pharmacist about medication and they stated it will take 3-4 weeks to take effect and may cause low blood count. Patient is curious if this medication will effect her heart - discussed cardiac effects per PharmD's note. Patient verbalized understanding but still would like an opinion from cardiologist if this medication is safe for her to take.  Will forward to MD/APP for review.

## 2023-10-16 NOTE — Telephone Encounter (Signed)
Patient returned RN's call and noted she will not be available until after 11:30 am.

## 2023-10-19 NOTE — Telephone Encounter (Signed)
Gabriel Rung, LPN at [id number]/6387  4:32 PM  Status: Signed  Returned call to patient. Her rheumatologist prescribed hydroxychloroquine for her knee muscle pain. She went to her pharmacist about medication and they stated it will take 3-4 weeks to take effect and may cause low blood count. Patient is curious if this medication will effect her heart - discussed cardiac effects per PharmD's note. Patient verbalized understanding but still would like an opinion from cardiologist if this medication is safe for her to take.   Will forward to MD/APP for review.

## 2023-10-19 NOTE — Addendum Note (Signed)
Addended by: Regis Bill B on: 10/19/2023 05:51 PM   Modules accepted: Orders, Level of Service

## 2023-10-19 NOTE — Telephone Encounter (Signed)
This encounter was created in error - please disregard.

## 2023-10-28 DIAGNOSIS — E785 Hyperlipidemia, unspecified: Secondary | ICD-10-CM | POA: Diagnosis not present

## 2023-11-05 ENCOUNTER — Telehealth (HOSPITAL_BASED_OUTPATIENT_CLINIC_OR_DEPARTMENT_OTHER): Payer: Self-pay | Admitting: Cardiology

## 2023-11-05 NOTE — Telephone Encounter (Signed)
  Pt calling back about this medication. Wanting to know if MD has had a chance to review  Returned call to patient. Her rheumatologist prescribed hydroxychloroquine for her knee muscle pain. She went to her pharmacist about medication and they stated it will take 3-4 weeks to take effect and may cause low blood count. Patient is curious if this medication will effect her heart - discussed cardiac effects per PharmD's note. Patient verbalized understanding but still would like an opinion from cardiologist if this medication is safe for her to take.   Will forward to MD/APP for review.

## 2023-11-10 NOTE — Telephone Encounter (Signed)
See phone note 11/21

## 2023-11-10 NOTE — Telephone Encounter (Signed)
Called and left voice message for patient that MD approved medication from rheumatologist. (Ok per dpr)

## 2023-11-10 NOTE — Telephone Encounter (Signed)
Follow Up:     Patient is calling back to see what Dr Cristal Deer said about taking the medicine that her Rheumatologist wanted her to take.

## 2023-11-16 NOTE — Telephone Encounter (Signed)
November 16, 2023 Jodelle Red, MD to Me     11/16/23  9:12 AM Note I believe I replied to this in another thread, but ok from heart perspective     Message left for patient, see 12/11 phone note

## 2023-11-16 NOTE — Telephone Encounter (Signed)
I believe I replied to this in another thread, but ok from heart perspective

## 2023-12-08 DIAGNOSIS — E785 Hyperlipidemia, unspecified: Secondary | ICD-10-CM | POA: Diagnosis not present

## 2023-12-08 DIAGNOSIS — I480 Paroxysmal atrial fibrillation: Secondary | ICD-10-CM | POA: Diagnosis not present

## 2023-12-08 DIAGNOSIS — G720 Drug-induced myopathy: Secondary | ICD-10-CM | POA: Diagnosis not present

## 2023-12-10 DIAGNOSIS — I480 Paroxysmal atrial fibrillation: Secondary | ICD-10-CM | POA: Diagnosis not present

## 2023-12-10 DIAGNOSIS — G720 Drug-induced myopathy: Secondary | ICD-10-CM | POA: Diagnosis not present

## 2023-12-10 DIAGNOSIS — E785 Hyperlipidemia, unspecified: Secondary | ICD-10-CM | POA: Diagnosis not present

## 2023-12-10 DIAGNOSIS — T783XXA Angioneurotic edema, initial encounter: Secondary | ICD-10-CM | POA: Diagnosis not present

## 2024-01-12 DIAGNOSIS — L570 Actinic keratosis: Secondary | ICD-10-CM | POA: Diagnosis not present

## 2024-01-12 DIAGNOSIS — L821 Other seborrheic keratosis: Secondary | ICD-10-CM | POA: Diagnosis not present

## 2024-01-12 DIAGNOSIS — L820 Inflamed seborrheic keratosis: Secondary | ICD-10-CM | POA: Diagnosis not present

## 2024-01-13 DIAGNOSIS — M1991 Primary osteoarthritis, unspecified site: Secondary | ICD-10-CM | POA: Diagnosis not present

## 2024-01-13 DIAGNOSIS — E663 Overweight: Secondary | ICD-10-CM | POA: Diagnosis not present

## 2024-01-13 DIAGNOSIS — M25561 Pain in right knee: Secondary | ICD-10-CM | POA: Diagnosis not present

## 2024-01-13 DIAGNOSIS — Z6828 Body mass index (BMI) 28.0-28.9, adult: Secondary | ICD-10-CM | POA: Diagnosis not present

## 2024-01-13 DIAGNOSIS — R768 Other specified abnormal immunological findings in serum: Secondary | ICD-10-CM | POA: Diagnosis not present

## 2024-01-18 DIAGNOSIS — H26493 Other secondary cataract, bilateral: Secondary | ICD-10-CM | POA: Diagnosis not present

## 2024-01-18 DIAGNOSIS — H524 Presbyopia: Secondary | ICD-10-CM | POA: Diagnosis not present

## 2024-01-18 DIAGNOSIS — Z79899 Other long term (current) drug therapy: Secondary | ICD-10-CM | POA: Diagnosis not present

## 2024-02-03 ENCOUNTER — Ambulatory Visit: Payer: Medicare Other | Attending: Cardiovascular Disease | Admitting: Cardiovascular Disease

## 2024-02-03 ENCOUNTER — Encounter: Payer: Self-pay | Admitting: Cardiovascular Disease

## 2024-02-03 VITALS — BP 130/58 | HR 63 | Ht 65.0 in | Wt 167.0 lb

## 2024-02-03 DIAGNOSIS — I10 Essential (primary) hypertension: Secondary | ICD-10-CM

## 2024-02-03 NOTE — Patient Instructions (Signed)
 Medication Instructions:  No medication changes were made during today's visit.  *If you need a refill on your cardiac medications before your next appointment, please call your pharmacy*   Lab Work: No labs were ordered during today's visit.  If you have labs (blood work) drawn today and your tests are completely normal, you will receive your results only by: MyChart Message (if you have MyChart) OR A paper copy in the mail If you have any lab test that is abnormal or we need to change your treatment, we will call you to review the results.   Testing/Procedures: No procedures were ordered during today's visit.    Follow-Up: At [facility], you and your health needs are our priority.  As part of our continuing mission to provide you with exceptional heart care, we have created designated Provider Care Teams.  These Care Teams include your primary Cardiologist (physician) and Advanced Practice Providers (APPs -  Physician Assistants and Nurse Practitioners) who all work together to provide you with the care you need, when you need it.  We recommend signing up for the patient portal called "MyChart".  Sign up information is provided on this After Visit Summary.  MyChart is used to connect with patients for Virtual Visits (Telemedicine).  Patients are able to view lab/test results, encounter notes, upcoming appointments, etc.  Non-urgent messages can be sent to your provider as well.   To learn more about what you can do with MyChart, go to [email or web address].    Your next appointment:    As needed for sleep care concerns  Provider:   Dr Armanda Magic     Other Instructions        1st Floor: - Lobby - Registration  - Pharmacy  - Lab - Cafe   2nd Floor: - PV Lab - Diagnostic Testing (echo, CT, nuclear med)   3rd Floor: - Vacant   4th Floor: - TCTS (cardiothoracic surgery) - AFib Clinic - Structural Heart Clinic - Vascular Surgery  - Vascular Ultrasound    5th Floor: - [facility] Cardiology (general and EP) - Clinical Pharmacy for coumadin, hypertension, lipid, weight-loss medications, and med management appointments      Valet parking services will be available as well.       Thank you for choosing  HeartCare!

## 2024-02-03 NOTE — Progress Notes (Signed)
 Cardiology Office Note    Date:  02/03/2024   ID:  Kristin Lowe, DOB June 29, 1941, MRN [id number]  PCP:  Rodrigo Ran, MD  Cardiologist:  Nicki Guadalajara, MD (sleep); Dr. Steffanie Dunn (EP); Dr. Cristal Deer  10 month F/U sleep evaluation   History of Present Illness:  Kristin Lowe is a 83 y.o. female who was seen by me for her initail sleep evaluation with me on May 3/12023. She presents for a 10 month evaluation.  Kristin Lowe remotely was seen by Dr. Graciela Husbands and most recently has established EP care with Dr. Steffanie Dunn.  She has a history of paroxysmal atrial fibrillation, remote CVA, hypertension, and is status post atrial fibrillation ablation in October 2022.  Patient also had loop recorder which recently was removed by Dr. Lalla Brothers.  Due to concerns for obstructive sleep apnea with symptoms of snoring, daytime sleepiness, fatigue and PAF, she had undergone an initial home sleep study which was done on July 09, 2021.  At that time, her Epworth Sleepiness Scale score was 16.  She was found to have moderate overall sleep apnea with an AHI of 20.3/h with a significant positional component with supine sleep AHI at 35.7/h.  She subsequently underwent a CPAP titration study on September 09, 2021 where CPAP was initiated and titrated to 17 cm but due to continued events was transition to BiPAP up to 22 over 18 cm of water.  It was initially recommended that she have a trial of BiPAP auto therapy with an EPAP min of 15, pressure support of 4, with maximum IPAP of 25 with humidification.  Due to supply chain issues, she did not receive her BiPAP unit until February 04, 2022.  [facility] as her DME company.  She has been using an AirFit F20 medium size mask.  She has had significant difficulty with the mask particularly causing irritation to the bridge of her nose.  She admits to getting dry with therapy and actually had self adjusted humidification to maximum which was in excess.    On  Mar 26, 2022, I saw her for my initial sleep evaluation.  A download was obtained from February 25, 2022 through Mar 26, 2022.  She is meeting compliance standards with usage every day averaging 8 hours per night.  Her 95th percentile pressure is 19.4/15.4 with a maximum average pressure at 20.0/16.0.  AHI is 0.8/h.  She does feel improved since initiating CPAP therapy.  A new Epworth Sleepiness Scale score was calculated in the office today and although improved it was still consistent with mild residual daytime sleepiness with a score of 11.  She denies any restless legs, bruxism, sleep paralysis, or cataplectic events.  During that evaluation, I had a very lengthy discussion with her regarding sleep apnea and potential adverse cardiovascular consequences.  I discussed its effect on blood pressure, increased potential for nocturnal arrhythmias, increased inflammation, insulin resistance, effects on GERD, potential for nocturnal hypoxemia contributing to ischemia and also reviewed the pathophysiology associated with frequent nocturia.  I felt her untreated sleep apnea undoubtedly played a role in her development of atrial fibrillation.  During that evaluation since she was having difficulty with her prior AirFit F20 medium size mask, I provided her sample of a new ResMed AirFit F30i which I felt may be beneficial in provide added comfort.  I changed her ramp start EPAP pressure to 6.  Her blood pressure was stable.  She continued to be on atorvastatin  for hyperlipidemia, pantoprazole for GERD, and was anticoagulated on Eliquis.  She underwent left knee surgery in August 2023.  Recently, she underwent parathyroidectomy by Dr. Gerrit Friends.  I saw her for my last evaluation on Apr 16, 2023.  She has had issues with left knee pain waking her up from sleep.  She is no longer with [facility] as DME company was transferred to [facility].  She typically goes to bed between 10 and 1030 and finds himself waking up often 3  hours later.  She is now using a small size fullface mask rather than the previous medium which was too large.  I obtained a download from April 16 through Apr 08, 2023.  She is meeting compliance standards and only missed 1 day of use over the past month.  Average usage is 7 hours and 48 minutes.  Her BiPAP is set at a minimum EPAP pressure of 15 with maximum IPAP pressure of 25.  Her 95th percentile pressure is 19.8/15.8 with maximum average pressure 20.5/16.5.  AHI 0.7.  She states her blood pressure at home typically runs around 1 29-135 systolically.  She is unaware of any recurrent atrial fibrillation.  She is anticoagulated on Eliquis 5 mg twice a day.  She is on atorvastatin 40 mg daily for hyperlipidemia.  She sees Dr. Rodrigo Ran for primary care.    Since I last saw her, she was seen by Dr. Jodelle Red 01/12/2023 and remained cardiac stable.  She has continued to use CPAP therapy.  She states her machine has been getting a message that says preparing SD card.  She is status post left knee replacement by Dr. Dierdre Forth.  She typically goes to bed around 10 to 10:30 PM and typically wakes up between 3:30 and 5 AM as a result of knee pain.  She often gets up for good sometime between 5 AM and 8 AM.  A download was obtained today from February 10 through February 02, 2024.  Usage is 100% with average use at 6 hours and 51 minutes.  [facility] is her DME company.  Her ResMed air curve 10 via auto unit is set at a minimum EPAP of 15 maximum IPAP of 25 and pressure support of 4.  Her 95th percentile pressure is 20.1/16.1 with maximum average pressure 21.3/17.3.  AHI is excellent at 1.0.  There is no mask leak.  Epworth sleepiness scale score was calculated in the office today and this was elevated at 15.  She presents for evaluation.   Past Medical History:  Diagnosis Date   Arthritis    Atrial fibrillation (HCC)    Bradycardia    Heart murmur    History of kidney stones    Hypertension    pt  denies htn at preop on 02/10/23   Paroxysmal atrial fibrillation (HCC)    Pre-diabetes    Sleep apnea    bipap   Stroke (cerebrum) [facility])     Past Surgical History:  Procedure Laterality Date   ANAL RECTAL MANOMETRY N/A 04/16/2022   Procedure: ANO RECTAL MANOMETRY;  Surgeon: Tressia Danas, MD;  Location: [facility] ENDOSCOPY;  Service: Gastroenterology;  Laterality: N/A;   ATRIAL FIBRILLATION ABLATION N/A 09/17/2021   Procedure: ATRIAL FIBRILLATION ABLATION;  Surgeon: Lanier Prude, MD;  Location: [facility] INVASIVE CV LAB;  Service: Cardiovascular;  Laterality: N/A;   CATARACT EXTRACTION, BILATERAL     LOOP RECORDER INSERTION N/A 05/17/2018   Procedure: LOOP RECORDER INSERTION;  Surgeon: Duke Salvia, MD;  Location:  [facility] INVASIVE CV LAB;  Service: Cardiovascular;  Laterality: N/A;   LOOP RECORDER INSERTION  05/17/2018   Procedure: LOOP RECORDER INSERTION;  Surgeon: Pricilla Riffle, MD;  Location: [facility] ENDOSCOPY;  Service: Cardiovascular;;   PARATHYROIDECTOMY Left 03/30/2023   Procedure: LEFT INFERIOR PARATHYROIDECTOMY;  Surgeon: [name], MD;  Location: [facility] ORS;  Service: General;  Laterality: Left;  2ND SCRUB PERSON FS   TEE WITHOUT CARDIOVERSION N/A 05/17/2018   Procedure: TRANSESOPHAGEAL ECHOCARDIOGRAM (TEE);  Surgeon: Pricilla Riffle, MD;  Location[facility] ENDOSCOPY;  Service: Cardiovascular;  Laterality: N/A;   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   Procedure: TOTAL KNEE ARTHROPLASTY;  Surgeon: Eugenia Mcalpine, MD;  Location: [facility] ORS;  Service: Orthopedics;  Laterality: Left;  with adductor canal   TRIGGER FINGER RELEASE     TUMOR REMOVAL Right    Right hand ganglion removed    Current Medications: Outpatient Medications Prior to Visit  Medication Sig Dispense Refill   acetaminophen (TYLENOL) 500 MG tablet Take 500 mg by mouth every 6 (six) hours as needed. Prn     apixaban (ELIQUIS) 5 MG TABS tablet Take 1 tablet (5 mg total) by mouth 2 (two) times daily. 180 tablet 1   ascorbic acid  (VITAMIN C) 500 MG tablet Take 500 mg by mouth daily.     azelastine (ASTELIN) 0.1 % nasal spray Place 2 sprays into both nostrils daily. Use in each nostril as directed     Cholecalciferol (VITAMIN D3) 125 MCG (5000 UT) CAPS Take 1 capsule by mouth daily.     Clobetasol Prop Emollient Base (CLOBETASOL PROPIONATE E) 0.05 % emollient cream Apply topically as needed.     Coenzyme Q10 (COQ-10) 100 MG CAPS Take 100 mg by mouth daily.     estradiol (ESTRACE) 0.1 MG/GM vaginal cream Place 1 Applicatorful vaginally 2 (two) times a week. Pt stopped 01/19/23     hydroxychloroquine (PLAQUENIL) 200 MG tablet Take 200 mg by mouth 2 (two) times daily.     Magnesium 250 MG TABS Take 1 tablet by mouth daily.     Triamcinolone Acetonide (NASACORT ALLERGY 24HR NA) Place 2 sprays into the nose daily.     Zinc 30 MG CAPS Take 1 capsule by mouth daily.     atorvastatin (LIPITOR) 40 MG tablet Take 1 tablet (40 mg total) by mouth daily at 6 PM. 30 tablet 3   No facility-administered medications prior to visit.     Allergies:   Atorvastatin, Ezetimibe, and Repatha [evolocumab]   Social History   Socioeconomic History   Marital status: Single    Spouse name: Not on file   Number of children: Not on file   Years of education: Not on file   Highest education level: Not on file  Occupational History   Not on file  Tobacco Use   Smoking status: Former    Current packs/day: 0.00    Average packs/day: 2.0 packs/day for 30.0 years (60.0 ttl pk-yrs)    Types: Cigarettes    Start date: 11/24/1960    Quit date: 11/24/1990    Years since quitting: 33.2   Smokeless tobacco: Never  Vaping Use   Vaping status: Never Used  Substance and Sexual Activity   Alcohol use: Yes    Comment: occ wine or beer   Drug use: Never   Sexual activity: Not Currently  Other Topics Concern   Not on file  Social History Narrative   Not on file   Social Drivers of Health  Financial Resource Strain: Not on file  Food Insecurity:  Not on file  Transportation Needs: Not on file  Physical Activity: Not on file  Stress: Not on file  Social Connections: Not on file    Socially, she was born in [place].  She was raised in [place] and follows the [employer].  There is remote tobacco history.  Family History:  The patient's family history includes Breast cancer in her mother; Diabetes in her brother and mother; Heart disease in her father; Hypertension in her brother and sister; Thyroid disease in her mother and sister.   ROS General: Negative; No fevers, chills, or night sweats;  HEENT: Negative; No changes in vision or hearing, sinus congestion, difficulty swallowing Pulmonary: Negative; No cough, wheezing, shortness of breath, hemoptysis Cardiovascular: History of A-fib, status post ablation, GI: Negative; No nausea, vomiting, diarrhea, or abdominal pain GU: Negative; No dysuria, hematuria, or difficulty voiding Musculoskeletal: Negative; no myalgias, joint pain, or weakness Hematologic/Oncology: Negative; no easy bruising, bleeding Endocrine: Negative; no heat/cold intolerance; no diabetes Neuro: Remote TIA, Skin: Negative; No rashes or skin lesions Psychiatric: Negative; No behavioral problems, depression Sleep: OSA, see HPI  A new Epworth Sleepiness Scale score was calculated in the office today, Apr 10, 2023 which showed a score of 3, arguing against residual daytime sleepiness.  Other comprehensive 14 point system review is negative.   PHYSICAL EXAM:   VS:  BP (!) 130/58   Pulse 63   Ht 5\' 5"  (1.651 m)   Wt 167 lb (75.8 kg)   SpO2 98%   BMI 27.79 kg/m     Repeat blood pressure by me was reviewed by me was 120/62  Wt Readings from Last 3 Encounters:  02/03/24 167 lb (75.8 kg)  05/13/23 175 lb 4.8 oz (79.5 kg)  04/10/23 173 lb 12.8 oz (78.8 kg)   General: Alert, oriented, no distress.  Skin: normal turgor, no rashes, warm and dry HEENT: Normocephalic, atraumatic. Pupils equal  round and reactive to light; sclera anicteric; extraocular muscles intact;  Nose without nasal septal hypertrophy Mouth/Parynx benign; Mallinpatti scale 3 Neck: No JVD, no carotid bruits; normal carotid upstroke Lungs: clear to ausculatation and percussion; no wheezing or rales Chest wall: without tenderness to palpitation Heart: PMI not displaced, RRR, s1 s2 normal, 1/6 systolic murmur, no diastolic murmur, no rubs, gallops, thrills, or heaves Abdomen: soft, nontender; no hepatosplenomehaly, BS+; abdominal aorta nontender and not dilated by palpation. Back: no CVA tenderness Pulses 2+ Musculoskeletal: full range of motion, normal strength, no joint deformities Extremities: no clubbing cyanosis or edema, Homan's sign negative  Neurologic: grossly nonfocal; Cranial nerves grossly wnl Psychologic: Normal mood and affect   Studies/Labs Reviewed:   EKG Interpretation Date/Time:  Wednesday February 03 2024 10:59:43 EDT Ventricular Rate:  63 PR Interval:  156 QRS Duration:  96 QT Interval:  458 QTC Calculation: 468 R Axis:   -46  Text Interpretation: Sinus rhythm with marked sinus arrhythmia Left axis deviation Incomplete right bundle branch block Septal infarct , age undetermined When compared with ECG of 20-Mar-2023 10:33, Septal infarct is now Present T wave amplitude has decreased in Lateral leads QT has lengthened Confirmed by Nicki Guadalajara ([id number]) on 02/08/2024 12:02:45 PM    Apr 10, 2023 ECG (independently read by me): NST at 63, left axis deviation, nonspecific ST changes  Mar 26, 2022 ECG (independently read by me): Sinus bradycardia at 54 with mild sinus arrythmia, baseline wander  Recent Labs:    Latest Ref Rng &  Units 03/20/2023   10:26 AM 02/10/2023    8:55 AM 07/02/2022    1:33 PM  BMP  Glucose 70 - 99 mg/dL 97  595    BUN 8 - 23 mg/dL 13  16    Creatinine 6.38 - 1.00 mg/dL 7.56  4.33  2.95   Sodium 135 - 145 mmol/L 139  140    Potassium 3.5 - 5.1 mmol/L 4.5  3.7     Chloride 98 - 111 mmol/L 106  107    CO2 22 - 32 mmol/L 24  26    Calcium 8.9 - 10.3 mg/dL 18.8  41.6          Latest Ref Rng & Units 07/12/2021   12:14 PM 05/30/2021    2:55 PM 05/15/2018    4:49 PM  Hepatic Function  Total Protein 6.0 - 8.5 g/dL 6.7  7.2  7.2   Albumin 3.7 - 4.7 g/dL 4.5  4.7  4.2   AST 0 - 40 IU/L 19  21  28    ALT 0 - 32 IU/L 19  22  25    Alk Phosphatase 44 - 121 IU/L 76  81  65   Total Bilirubin 0.0 - 1.2 mg/dL 0.5  0.3  0.4        Latest Ref Rng & Units 03/20/2023   10:26 AM 02/10/2023    8:55 AM 07/02/2022    1:33 PM  CBC  WBC 4.0 - 10.5 K/uL 5.6  5.6  6.4   Hemoglobin 12.0 - 15.0 g/dL 60.6  30.1  60.1   Hematocrit 36.0 - 46.0 % 38.4  41.2  40.4   Platelets 150 - 400 K/uL 285  286  233    Lab Results  Component Value Date   MCV 91.6 03/20/2023   MCV 90.7 02/10/2023   MCV 94.4 07/02/2022   Lab Results  Component Value Date   TSH 0.564 07/12/2021   Lab Results  Component Value Date   HGBA1C 6.1 (H) 02/10/2023     BNP No results found for: "BNP"  ProBNP No results found for: "PROBNP"   Lipid Panel     Component Value Date/Time   CHOL 182 05/15/2018 2119   TRIG 115 05/15/2018 2119   HDL 53 05/15/2018 2119   CHOLHDL 3.4 05/15/2018 2119   VLDL 23 05/15/2018 2119   LDLCALC 106 (H) 05/15/2018 2119     RADIOLOGY: No results found.   Additional studies/ records that were reviewed today include:    09/09/2021 CLINICAL INFORMATION The patient is referred for a BiPAP titration to treat sleep apnea.   Date of HST:  07/09/2021:  AHI 20.3/h; supine AHI 35.7/h; O2 nadir 90%.   SLEEP STUDY TECHNIQUE As per the AASM Manual for the Scoring of Sleep and Associated Events v2.3 (April 2016) with a hypopnea requiring 4% desaturations.   The channels recorded and monitored were frontal, central and occipital EEG, electrooculogram (EOG), submentalis EMG (chin), nasal and oral airflow, thoracic and abdominal wall motion, anterior tibialis EMG,  snore microphone, electrocardiogram, and pulse oximetry. Bilevel positive airway pressure (BPAP) was initiated at the beginning of the study and titrated to treat sleep-disordered breathing.   MEDICATIONS acetaminophen (TYLENOL) 325 MG tablet apixaban (ELIQUIS) 5 MG TABS tablet atorvastatin (LIPITOR) 40 MG tablet cholecalciferol (VITAMIN D3) 25 MCG (1000 UNIT) tablet clobetasol ointment (TEMOVATE) 0.05 % co-enzyme Q-10 50 MG capsule DHA-EPA-Vitamin E (OMEGA-3 COMPLEX PO) famotidine (PEPCID) 20 MG tablet Multiple Vitamins-Minerals (IMMUNE SUPPORT PO) OVER THE COUNTER  MEDICATION pantoprazole (PROTONIX) 40 MG tablet Triamcinolone Acetonide (NASACORT AQ NA) Turmeric 500 MG CAPS verapamil (CALAN) 40 MG tablet  Medications self-administered by patient taken the night of the study : N/A   RESPIRATORY PARAMETERS Optimal IPAP Pressure (cm): 22        AHI at Optimal Pressure (/hr) 2.5 Optimal EPAP Pressure (cm):            18           Overall Minimal O2 (%):         92.0     Minimal O2 at Optimal Pressure (%): 95.0   SLEEP ARCHITECTURE Start Time:      10:26:53 PM    Stop Time:       5:03:10 AM      Total Time (min):         396.3   Total Sleep Time (min):      315 Sleep Latency (min):   9.6       Sleep Efficiency (%):   79.5%  REM Latency (min):    75.0     WASO (min):  71.7 Stage N1 (%): 13.2%  Stage N2 (%): 65.4%  Stage N3 (%): 0.0%    Stage R (%):   21.4 Supine (%):     71.75   Arousal Index (/hr):     38.3        CARDIAC DATA The 2 lead EKG demonstrated sinus rhythm. The mean heart rate was 46.5 beats per minute. Other EKG findings include: PVCs.   LEG MOVEMENT DATA The total Periodic Limb Movements of Sleep (PLMS) were 0. The PLMS index was 0.0. A PLMS index of <15 is considered normal in adults.   IMPRESSIONS - CPAP was initiated at 5 cm, titrated to 17 cm and transitioned to BiPAP up to 22/18 cm of water (AHI 2.5/ RDI 3.3, O2 nadir of 95.0%.   - Central sleep apnea was not noted  during this titration (CAI = 0.8/h). - Significant oxygen desaturations were not observed during this titration (min O2 9 2.0%). - The patient snored with moderate snoring volume. - 2-lead EKG demonstrated: PVCs - Clinically significant periodic limb movements were not noted during this study. Arousals associated with PLMs were rare.   DIAGNOSIS - Obstructive Sleep Apnea (G47.33)   RECOMMENDATIONS - Recommend an initial trial of BiPAP Auto with EPAP min of 15, pressure support of 4, and IPAP max of 25 with heated humidification.  A Medium size Resmed Full Face Mask AirFit F20 mask was used for the titration.  - Effort should be made to optimize nasal and oropharyngeal patency. - Avoid alcohol, sedatives and other CNS depressants that may worsen sleep apnea and disrupt normal sleep architecture. - Sleep hygiene should be reviewed to assess factors that may improve sleep quality. - Weight management and regular exercise should be initiated or continued. - Recommend a download in 30 days and sleep clinic evaluation after 4 weeks of therapy.    ASSESSMENT:    1. Essential hypertension     PLAN:  Kristin. Jeffifer Rabold is a very pleasant 83year-old female who has a history of paroxysmal atrial fibrillation, remote TIA/CVA, hypertension, and underwent atrial fibrillation ablation in October 2022 by Dr. Lalla Brothers.  Remotely she had worn a loop monitor following her TIA/CVA which was felt to be due to atrial fibrillation.  She has been diagnosed with moderate overall sleep apnea and had an AHI of 20.3 on her initial home study with a  very severe in the supine position with AHI at 35.70/h.  On CPAP titration study she was titrated up to 17 cm and continued to have events and as result was transition to BiPAP therapy.  She has been on BiPAP with her ResMed air curve 10 via auto unit set at a minimum EPAP of 15, pressure support of 4 and maximum IPAP of 25 cm of water.  She has been on therapy since February 04, 2022.  At her initial evaluation in May 2023 compliance was excellent with 100% use and average use at 8 hours per night.  AHI is 0.8 and her 95th percentile pressure is 19.4/15.4 with maximum average pressure at 20/16.  During my initial evaluation I had a long discussion with her regarding sleep apnea and felt undoubtedly this played a role in her development of atrial fibrillation.  I reviewed with her normal sleep architecture and a disruptive effect of sleep apnea with normal sleep.  I discussed potential adverse cardiovascular consequences if sleep apnea was left untreated.  She has  continued to do exceptionally well with continued CPAP use.  She had undergone left knee surgery in August 2023 and subsequently has had difficulties with pain waking her from sleep.  She underwent parathyroidectomy by Dr. Gerrit Friends.  Her download from April 16 through Apr 08, 2023 showed excellent compliance with average use of 7 hours and 48 minutes.  Her 95th percent BiPAP pressure is 19.8/15.8 with maximum average pressure 20.8/16.6.  AHI continues to be excellent at 0.7.  Over the past year, she has had difficulty with  knee pain which has been disrupting her sleep and often awakening her at 3:30 - 5 AM.  Her download continues to be excellent with an AHI of 1.0 and her 95th percentile pressure is 20.1/16.1 with maximum average pressure 21.3/17.3.  Her Epworth Sleepiness Scale score today is elevated due to her awakening early in the morning.  She continues to be see Dr. Darcus Austin for primary care.  She tells me she will be moving back to [place] in May and will be living with her sister in [place], [place].  Multiple family members are still in [place].  She continues to be on Plaquenil for her rheumatoid arthritis.  She is anticoagulated on Eliquis.  It is been a pleasure taking care of her.  She will need to reestablish with cardiology and sleep medicine in  for subsequent evaluations.   Medication  Adjustments/Labs and Tests Ordered: Current medicines are reviewed at length with the patient today.  Concerns regarding medicines are outlined above.  Medication changes, Labs and Tests ordered today are listed in the Patient Instructions below. There are no Patient Instructions on file for this visit.   Signed, Nicki Guadalajara, MD, [facility], ABSM Diplomate, American Board of Sleep Medicine  02/03/2024 11:21 AM    [facility] [address], [address], [place], [place]  [address] Phone: [phone]

## 2024-02-05 ENCOUNTER — Telehealth: Payer: Self-pay | Admitting: Cardiology

## 2024-02-05 ENCOUNTER — Other Ambulatory Visit: Payer: Self-pay

## 2024-02-05 MED ORDER — APIXABAN 5 MG PO TABS: 5.0000 mg | ORAL_TABLET | Freq: Two times a day (BID) | ORAL | 1 refills | Status: DC

## 2024-02-05 NOTE — Telephone Encounter (Signed)
 Eliquis refill

## 2024-02-05 NOTE — Telephone Encounter (Signed)
*  STAT* If patient is at the pharmacy, call can be transferred to refill team.   1. Which medications need to be refilled? (please list name of each medication and dose if known) apixaban (ELIQUIS) 5 MG TABS tablet   2. Which pharmacy/location (including street and city if local pharmacy) is medication to be sent to? [facility] [address] [place] - [address] Phone: [phone]  Fax: [phone]   3. Do they need a 30 day or 90 day supply? 90

## 2024-02-05 NOTE — Telephone Encounter (Signed)
 Prescription refill request for Eliquis received. Indication:afib Last office visit:3/25 Scr:0.70  4/24 Age: 83 Weight:75.8  kg  Prescription refilled

## 2024-02-08 ENCOUNTER — Encounter: Payer: Self-pay | Admitting: Cardiovascular Disease

## 2024-02-18 DIAGNOSIS — E041 Nontoxic single thyroid nodule: Secondary | ICD-10-CM | POA: Diagnosis not present

## 2024-02-18 DIAGNOSIS — E785 Hyperlipidemia, unspecified: Secondary | ICD-10-CM | POA: Diagnosis not present

## 2024-02-25 DIAGNOSIS — M8580 Other specified disorders of bone density and structure, unspecified site: Secondary | ICD-10-CM | POA: Diagnosis not present

## 2024-03-02 ENCOUNTER — Ambulatory Visit
Admission: RE | Admit: 2024-03-02 | Discharge: 2024-03-02 | Disposition: A | Discharge status: Home / Self Care | Source: Ambulatory Visit | Attending: Obstetrics and Gynecology

## 2024-03-02 ENCOUNTER — Other Ambulatory Visit: Payer: Self-pay | Admitting: Obstetrics and Gynecology

## 2024-03-02 DIAGNOSIS — Z1231 Encounter for screening mammogram for malignant neoplasm of breast: Secondary | ICD-10-CM

## 2024-03-03 ENCOUNTER — Encounter (HOSPITAL_BASED_OUTPATIENT_CLINIC_OR_DEPARTMENT_OTHER): Payer: Self-pay | Admitting: Nurse Practitioner

## 2024-03-03 ENCOUNTER — Ambulatory Visit (HOSPITAL_BASED_OUTPATIENT_CLINIC_OR_DEPARTMENT_OTHER): Admitting: Nurse Practitioner

## 2024-03-03 VITALS — BP 126/74 | HR 61 | Ht 65.5 in | Wt 166.2 lb

## 2024-03-03 DIAGNOSIS — D6869 Other thrombophilia: Secondary | ICD-10-CM | POA: Diagnosis not present

## 2024-03-03 DIAGNOSIS — I480 Paroxysmal atrial fibrillation: Secondary | ICD-10-CM

## 2024-03-03 DIAGNOSIS — E785 Hyperlipidemia, unspecified: Secondary | ICD-10-CM | POA: Diagnosis not present

## 2024-03-03 DIAGNOSIS — R001 Bradycardia, unspecified: Secondary | ICD-10-CM | POA: Diagnosis not present

## 2024-03-03 DIAGNOSIS — I2510 Atherosclerotic heart disease of native coronary artery without angina pectoris: Secondary | ICD-10-CM | POA: Diagnosis not present

## 2024-03-03 DIAGNOSIS — G4733 Obstructive sleep apnea (adult) (pediatric): Secondary | ICD-10-CM

## 2024-03-03 DIAGNOSIS — R82998 Other abnormal findings in urine: Secondary | ICD-10-CM | POA: Diagnosis not present

## 2024-03-03 MED ORDER — APIXABAN 5 MG PO TABS: 5.0000 mg | ORAL_TABLET | Freq: Two times a day (BID) | ORAL | Status: AC

## 2024-03-03 MED ORDER — ROSUVASTATIN CALCIUM 5 MG PO TABS: 10.0000 mg | ORAL_TABLET | ORAL | 3 refills | Status: DC

## 2024-03-03 NOTE — Patient Instructions (Addendum)
 Medication Instructions:  Your physician has recommended you make the following change in your medication:  START Rosuvastatin 5 mg three times a week  *If you need a refill on your cardiac medications before your next appointment, please call your pharmacy*  Follow-Up: At [facility], you and your health needs are our priority.  As part of our continuing mission to provide you with exceptional heart care, our providers are all part of one team.  This team includes your primary Cardiologist (physician) and Advanced Practice Providers or APPs (Physician Assistants and Nurse Practitioners) who all work together to provide you with the care you need, when you need it.  Your next appointment:   As needed  Provider:   Jodelle Red, MD    We recommend signing up for the patient portal called "MyChart".  Sign up information is provided on this After Visit Summary.  MyChart is used to connect with patients for Virtual Visits (Telemedicine).  Patients are able to view lab/test results, encounter notes, upcoming appointments, etc.  Non-urgent messages can be sent to your provider as well.   To learn more about what you can do with MyChart, go to [email or web address].

## 2024-03-03 NOTE — Progress Notes (Signed)
 Cardiology Office Note:  .   Date:  03/03/2024  ID:  Kristin Lowe, DOB 31-Oct-1941, MRN [id number] PCP: Rodrigo Ran, MD  [facility] Providers Cardiologist:  Jodelle Red, MD Electrophysiologist:  Lanier Prude, MD  Sleep Medicine:  Nicki Guadalajara, MD    Patient Profile: .      PMH Cryptogenic stroke Hypertension Sinus bradycardia PAF on chronic anticoagulation S/p PVI 2022 Venous insufficiency Rheumatoid arthritis Former tobacco use Quit early 2000's OSA on CPAP Followed by Dr. Tresa Endo  She was referred to cardiology and seen by Dr. Cristal Deer for evaluation of PVCs 08/2020.  She had been followed by Dr. Graciela Husbands and had implantable loop recorder for cryptogenic stroke.  She reported history of cardiologist in [facility] for 12 years then she saw Dr. Jacinto Halim several years ago.  She met Dr. Graciela Husbands after cryptogenic stroke and had ILR implanted.  Echo 10/2014 in [facility] showed mitral valve prolapse and she underwent thallium 2015 that was negative.  Had Holter monitor 04/2013 without significant arrhythmias.  Reported history of PVCs.  In August 2022 Loop interrogation showed 11-hour episode of atrial fibrillation and a 29-hour episode of atrial fibrillation May 23, 2021.  She underwent A-fib ablation with Dr. Lalla Brothers on 09/17/2021.  Seen by Dr. Cristal Deer 05/13/2023.  She reported residual left knee pain 10 months postop.  She was walking 6000 steps per day and doing water aerobics.  She had removal of parathyroid and was feeling better overall.  She remained on anticoagulation for CHA2DS2-VASc score of 6.  She was advised to follow-up in 1 year.  Seen by Dr. Tresa Endo 02/03/2024 for sleep follow-up.  She reported she would be moving to [place] to live with her sister.  She reported waking up early in the morning but overall sleep management felt to be well-controlled.       History of Present Illness: [name] Kitchen   Kristin Lowe is a very pleasant 83 y.o. female who is  here today for follow-up PAF and hypertension.  She reports she is feeling well.  She lives an active lifestyle and has been packing boxes preparing for her move to [place] where she will live with her sister.  She has some chest discomfort when she sits down to rest at night but does not have any discomfort when she is active.  She denies shortness of breath, palpitations, orthopnea, PND, edema, presyncope, syncope.  We discussed history of coronary artery calcification seen on CT 08/2021 as she had a lengthy discussion with Dr. Waynard Edwards this morning about management of cholesterol.  She is previously been intolerant of atorvastatin, ezetimibe, and Repatha. She reports no return of a fib to her awareness.   Discussed the use of AI scribe software for clinical note transcription with the patient, who gave verbal consent to proceed.   ROS: See HPI       Studies Reviewed: [name] Kitchen        No results found for: "LIPOA"   Risk Assessment/Calculations:    CHA2DS2-VASc Score = 6   This indicates a 9.7% annual risk of stroke. The patient's score is based upon: CHF History: 0 HTN History: 1 Diabetes History: 0 Stroke History: 2 Vascular Disease History: 0 Age Score: 2 Gender Score: 1            Physical Exam:   VS:  BP 126/74   Pulse 61   Ht 5' 5.5" (1.664 m)   Wt 166 lb 3.2 oz (75.4 kg)   SpO2  98%   BMI 27.24 kg/m    Wt Readings from Last 3 Encounters:  03/03/24 166 lb 3.2 oz (75.4 kg)  02/03/24 167 lb (75.8 kg)  05/13/23 175 lb 4.8 oz (79.5 kg)    GEN: Well nourished, well developed in no acute distress NECK: No JVD; No carotid bruits CARDIAC: RRR, no murmurs, rubs, gallops RESPIRATORY:  Clear to auscultation without rales, wheezing or rhonchi  ABDOMEN: Soft, non-tender, non-distended EXTREMITIES:  No edema; No deformity     ASSESSMENT AND PLAN: .    PAF on chronic anticoagulation:  S/p a fib ablation 08/2021. No return of a fib to her awareness. No bleeding concerns. Continue  Eliquis 5 mg twice daily which is appropriate dose for stroke prevention for CHA2DS2-VASc score of 6.  Coronary artery calcification: CAC score of 10 (24th percentile) on CT 09/10/2021. She is having some chest discomfort at the end of the day since packing moving boxes. No chest pain, dyspnea, or other symptoms concerning for angina. No indication for further ischemic evaluation at this time. Lengthy discussion regarding LDL goal < 70. As noted below, she would like to try low dose rosuvastatin for lipid management. Focus on secondary prevention including heart healthy mostly plant based diet avoiding saturated fat, processed foods, simple carbohydrates, and sugar along with aiming for at least 150 minutes of moderate intensity exercise each week.   Sinus bradycardia: She reports typical HR in the 50s.  She is asymptomatic.  We will continue to monitor clinically.  She is not on AV nodal blocking agents.  OSA on CPAP: She is compliant with CPAP and last visit with Dr. Tresa Endo revealed excellent control of OSA. No acute concerns today.   Hyperlipidemia LDL < 70: Lipid panel completed 02/18/2024 revealed total cholesterol 158, HDL 60, LDL 84, triglycerides 68. Lengthy discussion about goal LDL 70 or lower.  She also discussed this with PCP.  He suggested she consider Leqvio when she establishes with cardiology in [place].  She would like to try low-dose rosuvastatin 5 mg 3 days a week prior to starting injections. Advised her to let us know if she has any concerns prior to next cardiology appointment.        Disposition: As needed (moving to [place])  Signed, Eligha Bridegroom, NP-C

## 2024-03-04 ENCOUNTER — Telehealth: Payer: Self-pay

## 2024-03-04 MED ORDER — ROSUVASTATIN CALCIUM 5 MG PO TABS: 5.0000 mg | ORAL_TABLET | ORAL | 3 refills | Status: AC

## 2024-03-04 NOTE — Telephone Encounter (Signed)
 [facility] is requesting the provider to change medication rosuvastatin 5 mg tablets to rosuvastatin 10 mg tablets 3 times weekly, because pt's insurance will not pay for 2 tablets 3 times a week, as prescribed. Please address

## 2024-03-04 NOTE — Telephone Encounter (Signed)
 New rx has been sent. The original order was for 5 mg 3 days per week rather than 10 mg 3 x per week.

## 2024-03-14 DIAGNOSIS — E663 Overweight: Secondary | ICD-10-CM | POA: Diagnosis not present

## 2024-03-14 DIAGNOSIS — M25561 Pain in right knee: Secondary | ICD-10-CM | POA: Diagnosis not present

## 2024-03-14 DIAGNOSIS — R768 Other specified abnormal immunological findings in serum: Secondary | ICD-10-CM | POA: Diagnosis not present

## 2024-03-14 DIAGNOSIS — Z6827 Body mass index (BMI) 27.0-27.9, adult: Secondary | ICD-10-CM | POA: Diagnosis not present

## 2024-03-14 DIAGNOSIS — M1991 Primary osteoarthritis, unspecified site: Secondary | ICD-10-CM | POA: Diagnosis not present

## 2024-03-15 ENCOUNTER — Encounter (HOSPITAL_COMMUNITY)

## 2024-03-17 ENCOUNTER — Encounter (HOSPITAL_COMMUNITY)

## 2024-03-30 ENCOUNTER — Telehealth: Payer: Self-pay | Admitting: Cardiovascular Disease

## 2024-03-30 NOTE — Telephone Encounter (Signed)
 Patient is requesting for her records to be faxed to [facility] at [phone]. Patient needs the first report that Dr. Loetta Ringer stated she had sleep apnea and requested for the last OV notes to be sent. Patient requested for a call back once this has been faxed. Patient requested we leave a detailed VM if she does not answer.

## 2024-06-10 NOTE — Telephone Encounter (Signed)
 Pt requesting her last office notes be sent to the fax number [phone].
